# Patient Record
Sex: Male | Born: 1945
Health system: Southern US, Community
[De-identification: ages and names within clinical notes are randomized; demographics above are authoritative.]

## PROBLEM LIST (undated history)

## (undated) DIAGNOSIS — N5201 Erectile dysfunction due to arterial insufficiency: Secondary | ICD-10-CM

## (undated) DIAGNOSIS — K219 Gastro-esophageal reflux disease without esophagitis: Secondary | ICD-10-CM

## (undated) DIAGNOSIS — N401 Enlarged prostate with lower urinary tract symptoms: Secondary | ICD-10-CM

## (undated) DIAGNOSIS — I10 Essential (primary) hypertension: Secondary | ICD-10-CM

## (undated) DIAGNOSIS — N400 Enlarged prostate without lower urinary tract symptoms: Secondary | ICD-10-CM

## (undated) DIAGNOSIS — N138 Other obstructive and reflux uropathy: Secondary | ICD-10-CM

## (undated) DIAGNOSIS — R351 Nocturia: Secondary | ICD-10-CM

## (undated) DIAGNOSIS — R972 Elevated prostate specific antigen [PSA]: Secondary | ICD-10-CM

## (undated) DIAGNOSIS — E785 Hyperlipidemia, unspecified: Secondary | ICD-10-CM

## (undated) DIAGNOSIS — N419 Inflammatory disease of prostate, unspecified: Secondary | ICD-10-CM

## (undated) DIAGNOSIS — I4891 Unspecified atrial fibrillation: Secondary | ICD-10-CM

## (undated) DIAGNOSIS — R011 Cardiac murmur, unspecified: Secondary | ICD-10-CM

## (undated) DIAGNOSIS — M1990 Unspecified osteoarthritis, unspecified site: Secondary | ICD-10-CM

## (undated) HISTORY — DX: Hyperlipidemia, unspecified: E78.5

## (undated) HISTORY — DX: Inflammatory disease of prostate, unspecified: N41.9

## (undated) HISTORY — DX: Gastro-esophageal reflux disease without esophagitis: K21.9

## (undated) HISTORY — DX: Other obstructive and reflux uropathy: N40.1

## (undated) HISTORY — DX: Erectile dysfunction due to arterial insufficiency: N52.01

## (undated) HISTORY — DX: Cardiac murmur, unspecified: R01.1

## (undated) HISTORY — DX: Essential (primary) hypertension: I10

## (undated) HISTORY — DX: Benign prostatic hyperplasia without lower urinary tract symptoms: N40.0

## (undated) HISTORY — DX: Unspecified atrial fibrillation: I48.91

## (undated) HISTORY — DX: Nocturia: R35.1

## (undated) HISTORY — DX: Benign prostatic hyperplasia with lower urinary tract symptoms: N40.1

## (undated) HISTORY — PX: JOINT REPLACEMENT: SHX530

## (undated) HISTORY — DX: Elevated prostate specific antigen (PSA): R97.20

## (undated) HISTORY — DX: Other obstructive and reflux uropathy: N13.8

## (undated) HISTORY — DX: Unspecified osteoarthritis, unspecified site: M19.90

---

## 2002-11-14 ENCOUNTER — Ambulatory Visit (HOSPITAL_COMMUNITY): Admission: RE | Admit: 2002-11-14 | Discharge: 2002-11-14 | Payer: Self-pay | Admitting: *Deleted

## 2002-11-14 ENCOUNTER — Encounter: Payer: Self-pay | Admitting: Internal Medicine

## 2002-11-16 ENCOUNTER — Encounter: Payer: Self-pay | Admitting: Internal Medicine

## 2004-04-08 ENCOUNTER — Encounter: Admission: RE | Admit: 2004-04-08 | Discharge: 2004-04-08 | Payer: Self-pay | Admitting: Family Medicine

## 2006-08-21 ENCOUNTER — Ambulatory Visit: Payer: Self-pay | Admitting: Family Medicine

## 2006-08-21 LAB — CONVERTED CEMR LAB
ALT: 25 units/L (ref 0–40)
AST: 25 units/L (ref 0–37)
BUN: 20 mg/dL (ref 6–23)
CO2: 30 meq/L (ref 19–32)
Calcium: 9.7 mg/dL (ref 8.4–10.5)
Chloride: 102 meq/L (ref 96–112)
Creatinine, Ser: 1.1 mg/dL (ref 0.4–1.5)
VLDL: 7 mg/dL (ref 0–40)

## 2006-12-08 ENCOUNTER — Ambulatory Visit: Payer: Self-pay | Admitting: Family Medicine

## 2006-12-08 LAB — CONVERTED CEMR LAB
ALT: 23 units/L (ref 0–40)
Alkaline Phosphatase: 85 units/L (ref 39–117)
BUN: 17 mg/dL (ref 6–23)
Bilirubin, Direct: 0.1 mg/dL (ref 0.0–0.3)
CO2: 29 meq/L (ref 19–32)
Cholesterol: 145 mg/dL (ref 0–200)
Creatinine, Ser: 1 mg/dL (ref 0.4–1.5)
GFR calc Af Amer: 97 mL/min
Glucose, Bld: 113 mg/dL — ABNORMAL HIGH (ref 70–99)
PSA: 1.2 ng/mL (ref 0.10–4.00)
Potassium: 4.2 meq/L (ref 3.5–5.1)
Total Bilirubin: 0.9 mg/dL (ref 0.3–1.2)
Total CHOL/HDL Ratio: 2.9
Total Protein: 6.9 g/dL (ref 6.0–8.3)
Triglycerides: 52 mg/dL (ref 0–149)

## 2007-01-05 ENCOUNTER — Ambulatory Visit: Payer: Self-pay | Admitting: Family Medicine

## 2007-01-05 LAB — CONVERTED CEMR LAB: Total CK: 279 units/L (ref 7–195)

## 2007-03-08 DIAGNOSIS — E785 Hyperlipidemia, unspecified: Secondary | ICD-10-CM | POA: Insufficient documentation

## 2007-03-08 DIAGNOSIS — E1169 Type 2 diabetes mellitus with other specified complication: Secondary | ICD-10-CM | POA: Insufficient documentation

## 2007-03-08 DIAGNOSIS — I10 Essential (primary) hypertension: Secondary | ICD-10-CM | POA: Insufficient documentation

## 2007-03-08 DIAGNOSIS — N400 Enlarged prostate without lower urinary tract symptoms: Secondary | ICD-10-CM

## 2007-03-09 ENCOUNTER — Ambulatory Visit: Payer: Self-pay | Admitting: Family Medicine

## 2007-03-09 ENCOUNTER — Encounter (INDEPENDENT_AMBULATORY_CARE_PROVIDER_SITE_OTHER): Payer: Self-pay | Admitting: Family Medicine

## 2007-03-09 ENCOUNTER — Encounter: Payer: Self-pay | Admitting: Family Medicine

## 2007-03-09 LAB — CONVERTED CEMR LAB
BUN: 19 mg/dL
CO2: 29 meq/L
Calcium: 9.9 mg/dL
Chloride: 106 meq/L
Cholesterol: 203 mg/dL
Creatinine, Ser: 1 mg/dL
Direct LDL: 121.8 mg/dL
GFR calc Af Amer: 97 mL/min
GFR calc non Af Amer: 80 mL/min
Glucose, Bld: 110 mg/dL — ABNORMAL HIGH
HDL: 52.2 mg/dL
Potassium: 4.3 meq/L
Sodium: 141 meq/L
Total CHOL/HDL Ratio: 3.9
Total CK: 241 U/L
Triglycerides: 89 mg/dL
VLDL: 18 mg/dL

## 2007-03-10 ENCOUNTER — Telehealth (INDEPENDENT_AMBULATORY_CARE_PROVIDER_SITE_OTHER): Payer: Self-pay | Admitting: *Deleted

## 2007-03-15 ENCOUNTER — Telehealth (INDEPENDENT_AMBULATORY_CARE_PROVIDER_SITE_OTHER): Payer: Self-pay | Admitting: *Deleted

## 2007-05-17 ENCOUNTER — Ambulatory Visit: Payer: Self-pay | Admitting: Family Medicine

## 2007-05-25 LAB — CONVERTED CEMR LAB: Total CK: 215 units/L (ref 7–195)

## 2007-05-26 ENCOUNTER — Telehealth (INDEPENDENT_AMBULATORY_CARE_PROVIDER_SITE_OTHER): Payer: Self-pay | Admitting: *Deleted

## 2007-09-07 ENCOUNTER — Ambulatory Visit: Payer: Self-pay | Admitting: Family Medicine

## 2007-09-10 ENCOUNTER — Encounter (INDEPENDENT_AMBULATORY_CARE_PROVIDER_SITE_OTHER): Payer: Self-pay | Admitting: *Deleted

## 2007-09-10 ENCOUNTER — Telehealth (INDEPENDENT_AMBULATORY_CARE_PROVIDER_SITE_OTHER): Payer: Self-pay | Admitting: *Deleted

## 2007-09-28 ENCOUNTER — Ambulatory Visit: Payer: Self-pay | Admitting: Family Medicine

## 2007-09-28 DIAGNOSIS — R748 Abnormal levels of other serum enzymes: Secondary | ICD-10-CM | POA: Insufficient documentation

## 2007-09-29 ENCOUNTER — Encounter (INDEPENDENT_AMBULATORY_CARE_PROVIDER_SITE_OTHER): Payer: Self-pay | Admitting: Family Medicine

## 2007-09-29 LAB — CONVERTED CEMR LAB
BUN: 21 mg/dL (ref 6–23)
CK-MB: 3.3 ng/mL (ref 0.3–4.0)
CO2: 28 meq/L (ref 19–32)
Calcium: 9.9 mg/dL (ref 8.4–10.5)
Chloride: 103 meq/L (ref 96–112)
Creatinine, Ser: 1.1 mg/dL (ref 0.4–1.5)
Direct LDL: 151.6 mg/dL
Potassium: 4.1 meq/L (ref 3.5–5.1)
VLDL: 15 mg/dL (ref 0–40)

## 2007-09-30 LAB — CONVERTED CEMR LAB
CO2: 28 meq/L (ref 19–32)
Calcium: 9.9 mg/dL (ref 8.4–10.5)
Cholesterol: 211 mg/dL (ref 0–200)
Direct LDL: 151.6 mg/dL
GFR calc Af Amer: 87 mL/min
Glucose, Bld: 94 mg/dL (ref 70–99)
HDL: 45.2 mg/dL (ref >39.0)
Sodium: 140 meq/L (ref 135–145)
Triglycerides: 74 mg/dL (ref 0–149)

## 2007-10-04 ENCOUNTER — Telehealth (INDEPENDENT_AMBULATORY_CARE_PROVIDER_SITE_OTHER): Payer: Self-pay | Admitting: Family Medicine

## 2008-01-06 ENCOUNTER — Ambulatory Visit: Payer: Self-pay | Admitting: Family Medicine

## 2008-01-06 LAB — CONVERTED CEMR LAB
ALT: 23 units/L (ref 0–53)
Calcium: 9.5 mg/dL (ref 8.4–10.5)
Direct LDL: 149.9 mg/dL
GFR calc Af Amer: 97 mL/min
GFR calc non Af Amer: 80 mL/min
Glucose, Bld: 107 mg/dL — ABNORMAL HIGH (ref 70–99)
Sodium: 141 meq/L (ref 135–145)
Total CHOL/HDL Ratio: 5.1
Triglycerides: 85 mg/dL (ref 0–149)
VLDL: 17 mg/dL (ref 0–40)

## 2008-01-07 ENCOUNTER — Encounter (INDEPENDENT_AMBULATORY_CARE_PROVIDER_SITE_OTHER): Payer: Self-pay | Admitting: *Deleted

## 2008-02-15 ENCOUNTER — Encounter: Payer: Self-pay | Admitting: Internal Medicine

## 2008-05-03 ENCOUNTER — Ambulatory Visit: Payer: Self-pay | Admitting: Internal Medicine

## 2008-05-08 ENCOUNTER — Encounter: Payer: Self-pay | Admitting: Internal Medicine

## 2008-05-08 LAB — CONVERTED CEMR LAB
Total CHOL/HDL Ratio: 5.8
Total CK: 153 units/L (ref 7–195)
VLDL: 22 mg/dL (ref 0–40)

## 2008-05-23 ENCOUNTER — Telehealth (INDEPENDENT_AMBULATORY_CARE_PROVIDER_SITE_OTHER): Payer: Self-pay | Admitting: *Deleted

## 2008-09-20 ENCOUNTER — Ambulatory Visit: Payer: Self-pay | Admitting: Internal Medicine

## 2008-09-20 DIAGNOSIS — K219 Gastro-esophageal reflux disease without esophagitis: Secondary | ICD-10-CM | POA: Insufficient documentation

## 2008-09-22 LAB — CONVERTED CEMR LAB
ALT: 18 units/L (ref 0–53)
AST: 20 units/L (ref 0–37)
Cholesterol: 149 mg/dL (ref 0–200)

## 2008-09-25 ENCOUNTER — Telehealth (INDEPENDENT_AMBULATORY_CARE_PROVIDER_SITE_OTHER): Payer: Self-pay | Admitting: *Deleted

## 2008-09-25 ENCOUNTER — Encounter: Payer: Self-pay | Admitting: Internal Medicine

## 2009-01-23 ENCOUNTER — Ambulatory Visit: Payer: Self-pay | Admitting: Internal Medicine

## 2009-01-23 DIAGNOSIS — K4090 Unilateral inguinal hernia, without obstruction or gangrene, not specified as recurrent: Secondary | ICD-10-CM | POA: Insufficient documentation

## 2009-01-29 ENCOUNTER — Telehealth (INDEPENDENT_AMBULATORY_CARE_PROVIDER_SITE_OTHER): Payer: Self-pay | Admitting: *Deleted

## 2009-01-29 LAB — CONVERTED CEMR LAB
ALT: 21 units/L (ref 0–53)
Basophils Absolute: 0 10*3/uL (ref 0.0–0.1)
Basophils Relative: 0.3 % (ref 0.0–3.0)
Calcium: 9.1 mg/dL (ref 8.4–10.5)
GFR calc non Af Amer: 79.68 mL/min (ref >60)
HDL: 37.7 mg/dL — ABNORMAL LOW (ref >39.00)
Hemoglobin: 14.3 g/dL (ref 13.0–17.0)
Lymphocytes Relative: 25.9 % (ref 12.0–46.0)
Monocytes Relative: 14.7 % — ABNORMAL HIGH (ref 3.0–12.0)
Neutro Abs: 3 10*3/uL (ref 1.4–7.7)
RBC: 4.54 M/uL (ref 4.22–5.81)
RDW: 13.3 % (ref 11.5–14.6)
Sodium: 142 meq/L (ref 135–145)
TSH: 1.64 microintl units/mL (ref 0.35–5.50)
VLDL: 20.4 mg/dL (ref 0.0–40.0)
WBC: 5.2 10*3/uL (ref 4.5–10.5)

## 2009-07-10 ENCOUNTER — Ambulatory Visit: Payer: Self-pay | Admitting: Internal Medicine

## 2009-07-13 ENCOUNTER — Telehealth (INDEPENDENT_AMBULATORY_CARE_PROVIDER_SITE_OTHER): Payer: Self-pay | Admitting: *Deleted

## 2009-07-13 LAB — CONVERTED CEMR LAB
Basophils Absolute: 0 10*3/uL (ref 0.0–0.1)
Eosinophils Absolute: 0.1 10*3/uL (ref 0.0–0.7)
Hemoglobin: 15.3 g/dL (ref 13.0–17.0)
Lymphocytes Relative: 26.6 % (ref 12.0–46.0)
Lymphs Abs: 1.1 10*3/uL (ref 0.7–4.0)
MCHC: 34 g/dL (ref 30.0–36.0)
Monocytes Absolute: 0.5 10*3/uL (ref 0.1–1.0)
Neutro Abs: 2.4 10*3/uL (ref 1.4–7.7)
RDW: 13.5 % (ref 11.5–14.6)

## 2010-01-09 ENCOUNTER — Ambulatory Visit: Payer: Self-pay | Admitting: Internal Medicine

## 2010-01-09 DIAGNOSIS — D696 Thrombocytopenia, unspecified: Secondary | ICD-10-CM

## 2010-01-21 LAB — CONVERTED CEMR LAB
CO2: 29 meq/L (ref 19–32)
Chloride: 105 meq/L (ref 96–112)
Glucose, Bld: 99 mg/dL (ref 70–99)
HDL: 52.4 mg/dL (ref >39.00)
Potassium: 4.1 meq/L (ref 3.5–5.1)
Sodium: 141 meq/L (ref 135–145)

## 2010-06-03 ENCOUNTER — Encounter: Payer: Self-pay | Admitting: Internal Medicine

## 2010-06-03 LAB — HM COLONOSCOPY

## 2010-07-11 ENCOUNTER — Ambulatory Visit: Payer: Self-pay | Admitting: Internal Medicine

## 2010-07-15 ENCOUNTER — Encounter (INDEPENDENT_AMBULATORY_CARE_PROVIDER_SITE_OTHER): Payer: Self-pay | Admitting: *Deleted

## 2010-07-15 LAB — CONVERTED CEMR LAB
AST: 21 units/L (ref 0–37)
Basophils Relative: 0.6 % (ref 0.0–3.0)
CO2: 29 meq/L (ref 19–32)
Chloride: 106 meq/L (ref 96–112)
Eosinophils Relative: 1.9 % (ref 0.0–5.0)
Glucose, Bld: 92 mg/dL (ref 70–99)
HCT: 42.7 % (ref 39.0–52.0)
Lymphs Abs: 1 10*3/uL (ref 0.7–4.0)
MCV: 91.1 fL (ref 78.0–100.0)
Monocytes Absolute: 0.5 10*3/uL (ref 0.1–1.0)
Monocytes Relative: 11.2 % (ref 3.0–12.0)
Potassium: 4.5 meq/L (ref 3.5–5.1)
RBC: 4.69 M/uL (ref 4.22–5.81)
Sodium: 142 meq/L (ref 135–145)
WBC: 4.1 10*3/uL — ABNORMAL LOW (ref 4.5–10.5)

## 2010-07-22 ENCOUNTER — Ambulatory Visit: Payer: Self-pay | Admitting: Internal Medicine

## 2010-07-27 HISTORY — PX: HERNIA REPAIR: SHX51

## 2010-08-02 ENCOUNTER — Encounter: Payer: Self-pay | Admitting: Internal Medicine

## 2010-08-12 ENCOUNTER — Ambulatory Visit (HOSPITAL_COMMUNITY): Admission: RE | Admit: 2010-08-12 | Discharge: 2010-08-12 | Payer: Self-pay | Admitting: Surgery

## 2010-09-05 ENCOUNTER — Encounter: Payer: Self-pay | Admitting: Internal Medicine

## 2010-10-22 ENCOUNTER — Encounter: Payer: Self-pay | Admitting: Internal Medicine

## 2010-11-26 NOTE — Procedures (Signed)
Summary: Colonoscopy--polyps, internal hemorrhoids  Colonoscopy/[facility]   Imported By: Lanelle Bal 06/20/2010 11:13:21  _____________________________________________________________________  External Attachment:    Type:   Image     Comment:   External Document

## 2010-11-26 NOTE — Assessment & Plan Note (Signed)
Summary: fu on meds/kdc   Vital Signs:  Patient profile:   65 year old male Height:      73.25 inches Weight:      214.8 pounds BMI:     28.25 Pulse rate:   76 / minute BP sitting:   118 / 80  Vitals Entered By: Shary Decamp (January 09, 2010 11:36 AM) CC: rov   History of Present Illness: routine visit , fasting, needs refills  Current Medications (verified): 1)  Amlodipine Besylate 10 Mg  Tabs (Amlodipine Besylate) .... Take One Tablet Daily 2)  Doxazosin Mesylate 8 Mg  Tabs (Doxazosin Mesylate) .... Take One Tablet Daily 3)  Calcium 500/d 500-125 Mg-Unit  Tabs (Calcium Carbonate-Vitamin D) 4)  Glucosamine-Chondroitin 1500-1200 Mg/33ml  Liqd (Glucosamine-Chondroitin) 5)  Pravachol 40 Mg  Tabs (Pravastatin Sodium) .Marland Kitchen.. 1 By Mouth Qhs 6)  Fish Oil 1000 Mg Caps (Omega-3 Fatty Acids) .... Take 1 Cap Once Daily 7)  Vitamin D3 2000 Unit Caps (Cholecalciferol) .... Take 1 Cap Once Daily 8)  Mvi 9)  Fish Oil  Allergies (verified): No Known Drug Allergies  Past History:  Past Medical History: Reviewed history from 01/23/2009 and no changes required. Hyperlipidemia Hypertension Benign prostatic hypertrophy, ED --sees urology q year GERD  Past Surgical History: Reviewed history from 05/03/2008 and no changes required. Denies surgical history  Social History: Reviewed history from 01/23/2009 and no changes required. Married  retired 2 children has 3 acres, works in the yard a lot x 6 hours sometimes, feels well  Review of Systems        ambulatory BPs within normal limits, usually around 130/70 good medication compliance with his cholesterol and BP medications denies any myalgias except for occasionally bicipital  pain when he does  heavy lifting he continued to have a healthy lifestyle  Physical Exam  General:  alert, well-developed, and well-nourished.   Lungs:  normal respiratory effort, no intercostal retractions, no accessory muscle use, and normal breath  sounds.   Heart:  normal rate, regular rhythm, no murmur, and no gallop.   Extremities:  no lower extremity edema   Impression & Recommendations:  Problem # 1:  HYPERTENSION (ICD-401.9) at goal, no change His updated medication list for this problem includes:    Amlodipine Besylate 10 Mg Tabs (Amlodipine besylate) .Marland Kitchen... Take one tablet daily    Doxazosin Mesylate 8 Mg Tabs (Doxazosin mesylate) .Marland Kitchen... Take one tablet daily  Orders: TLB-BMP (Basic Metabolic Panel-BMET) (80048-METABOL)  BP today: 118/80 Prior BP: 120/80 (07/10/2009)  Labs Reviewed: K+: 4.0 (01/23/2009) Creat: : 1.0 (01/23/2009)   Chol: 142 (01/23/2009)   HDL: 37.70 (01/23/2009)   LDL: 84 (01/23/2009)   TG: 102.0 (01/23/2009)  Problem # 2:  HYPERLIPIDEMIA (ICD-272.4) due for labs  His updated medication list for this problem includes:    Pravachol 40 Mg Tabs (Pravastatin sodium) .Marland Kitchen... 1 by mouth qhs  Orders: Venipuncture (78295) TLB-Lipid Panel (80061-LIPID) TLB-ALT (SGPT) (84460-ALT) TLB-AST (SGOT) (84450-SGOT)  Problem # 3:  THROMBOCYTOPENIA (ICD-287.5) mild, stable thrombocytopenia, patient asymptomatic. Observation  Complete Medication List: 1)  Amlodipine Besylate 10 Mg Tabs (Amlodipine besylate) .... Take one tablet daily 2)  Doxazosin Mesylate 8 Mg Tabs (Doxazosin mesylate) .... Take one tablet daily 3)  Calcium 500/d 500-125 Mg-unit Tabs (Calcium carbonate-vitamin d) 4)  Glucosamine-chondroitin 1500-1200 Mg/71ml Liqd (Glucosamine-chondroitin) 5)  Pravachol 40 Mg Tabs (Pravastatin sodium) .Marland Kitchen.. 1 by mouth qhs 6)  Fish Oil 1000 Mg Caps (Omega-3 fatty acids) .... Take 1 cap once daily 7)  Vitamin  D3 2000 Unit Caps (Cholecalciferol) .... Take 1 cap once daily 8)  Mvi  9)  Fish Oil   Patient Instructions: 1)  Please schedule a follow-up appointment in 6  months ( yearly checkup) Prescriptions: PRAVACHOL 40 MG  TABS (PRAVASTATIN SODIUM) 1 by mouth qhs  #30 Tablet x 12   Entered and Authorized by:    Beckham Buxbaum E. Eleesha Purkey MD   Signed by:   Nolon Rod. Kerstie Agent MD on 01/09/2010   Method used:   Print then Give to Patient   RxID:   [id number] DOXAZOSIN MESYLATE 8 MG  TABS (DOXAZOSIN MESYLATE) Take one tablet daily  #30 Tablet x 12   Entered and Authorized by:   Nolon Rod. Eve Rey MD   Signed by:   Nolon Rod. Mattheo Swindle MD on 01/09/2010   Method used:   Print then Give to Patient   RxID:   [id number] AMLODIPINE BESYLATE 10 MG  TABS (AMLODIPINE BESYLATE) TAKE ONE TABLET DAILY  #30 x 12   Entered and Authorized by:   Nolon Rod. Colie Fugitt MD   Signed by:   Nolon Rod. Kenlei Safi MD on 01/09/2010   Method used:   Print then Give to Patient   RxID:   [id number]

## 2010-11-26 NOTE — Letter (Signed)
Summary: hernia surgery f/u, f/u prn---- Surgery  [facility]   Imported By: Lanelle Bal 09/24/2010 09:20:48  _____________________________________________________________________  External Attachment:    Type:   Image     Comment:   External Document

## 2010-11-26 NOTE — Assessment & Plan Note (Signed)
Summary: SHINGLE SHOT//PH   Nurse Visit   Allergies: No Known Drug Allergies  Immunizations Administered:  Zostavax # 1:    Vaccine Type: Zostavax    Site: right deltoid    Mfr: Merck    Dose: 0.5 ml    Route: [place]    Given by: Danielle Barmer CMA    Exp. Date: 06/14/2011    Lot #: [id number]  Orders Added: 1)  Zoster (Shingles) Vaccine Live [90736] 2)  Admin 1st Vaccine [90471] 

## 2010-11-26 NOTE — Assessment & Plan Note (Signed)
Summary: FOR MED REFILL//PH   Vital Signs:  Patient profile:   65 year old male Weight:      213.50 pounds Pulse rate:   76 / minute Pulse rhythm:   regular BP sitting:   132 / 84  (left arm) Cuff size:   large  Vitals Entered By: Army Fossa CMA (July 11, 2010 10:44 AM) CC: pt here for f/u fasting Comments no refills needed declines flu shot    History of Present Illness: history of a right inguinal hernia,thinks is getting worse, slightly larger, painful when he does  heavy lifting  Reports severe episode off dysuria, frequency, was initially eval and to urgent care and then he saw his regular urologist, and diagnosed with prostatitis, PSA has been checked several times. He is now asymptomatic  Complain also left shoulder pain on and off for a while, pain is mostly nocturnal  Hyperlipidemia -- good medication compliance  Hypertension-- ambulatory BPs  120/80s     Current Medications (verified): 1)  Amlodipine Besylate 10 Mg  Tabs (Amlodipine Besylate) .... Take One Tablet Daily 2)  Doxazosin Mesylate 8 Mg  Tabs (Doxazosin Mesylate) .... Take One Tablet Daily 3)  Calcium 500/d 500-125 Mg-Unit  Tabs (Calcium Carbonate-Vitamin D) 4)  Glucosamine-Chondroitin 1500-1200 Mg/7ml  Liqd (Glucosamine-Chondroitin) 5)  Pravachol 40 Mg  Tabs (Pravastatin Sodium) .Marland Kitchen.. 1 By Mouth Qhs 6)  Fish Oil 1000 Mg Caps (Omega-3 Fatty Acids) .... Take 1 Cap Once Daily 7)  Vitamin D3 2000 Unit Caps (Cholecalciferol) .... Take 1 Cap Once Daily 8)  Mvi 9)  Fish Oil  Allergies (verified): No Known Drug Allergies  Past History:  Past Medical History: Hyperlipidemia Hypertension Benign prostatic hypertrophy, ED --sees urology q year GERD  Past Surgical History: Reviewed history from 05/03/2008 and no changes required. Denies surgical history  Social History: Reviewed history from 01/23/2009 and no changes required. Married  retired 2 children has 3 acres, works in the yard a  lot x 6 hours sometimes, feels well  Review of Systems CV:  Denies chest pain or discomfort and swelling of feet. Resp:  Denies cough and shortness of breath.  Physical Exam  General:  alert and well-developed.   Lungs:  normal respiratory effort, no intercostal retractions, no accessory muscle use, and normal breath sounds.   Heart:  normal rate, regular rhythm, no murmur, and no gallop.   Abdomen:    R inguinal hernia   w/ valsalva,  definitely larger than before, about 1 inch in size. Genitalia:  circumcised, no hydrocele, no varicocele, and no scrotal masses.   Msk:  right shoulder normal Left shoulder with normal range of motion, no deformities, slightly painful with elevation. Extremities:  no edema   Impression & Recommendations:  Problem # 1:  THROMBOCYTOPENIA (ICD-287.5) recheck a CBC  Orders: TLB-CBC Platelet - w/Differential (85025-CBCD) Specimen Handling ([address])  Orders: TLB-CBC Platelet - w/Differential (85025-CBCD) Specimen Handling (60454)  Problem # 2:  INGUINAL HERNIA, RIGHT (ICD-550.90)  definitely getting larger and now symptomatic. Refer to surgery  Orders: Surgical Referral (Surgery)  Problem # 3:  BENIGN PROSTATIC HYPERTROPHY (ICD-600.00) recent episode of prostatitis, status post evaluation by urology. Getting better  Problem # 4:  HYPERTENSION (ICD-401.9) at goal  His updated medication list for this problem includes:    Amlodipine Besylate 10 Mg Tabs (Amlodipine besylate) .Marland Kitchen... Take one tablet daily    Doxazosin Mesylate 8 Mg Tabs (Doxazosin mesylate) .Marland Kitchen... Take one tablet daily    BP today: 132/84 Prior BP:  118/80 (01/09/2010)  Labs Reviewed: K+: 4.1 (01/09/2010) Creat: : 1.0 (01/09/2010)   Chol: 152 (01/09/2010)   HDL: 52.40 (01/09/2010)   LDL: 82 (01/09/2010)   TG: 90.0 (01/09/2010)  Orders: Venipuncture ([address]) TLB-BMP (Basic Metabolic Panel-BMET) (80048-METABOL) Specimen Handling (60454)  Problem # 5:  HYPERLIPIDEMIA  (ICD-272.4) labs His updated medication list for this problem includes:    Pravachol 40 Mg Tabs (Pravastatin sodium) .Marland Kitchen... 1 by mouth qhs    Labs Reviewed: SGOT: 22 (01/09/2010)   SGPT: 24 (01/09/2010)   HDL:52.40 (01/09/2010), 37.70 (01/23/2009)  LDL:82 (01/09/2010), 84 (01/23/2009)  Chol:152 (01/09/2010), 142 (01/23/2009)  Trig:90.0 (01/09/2010), 102.0 (01/23/2009)  Orders: TLB-ALT (SGPT) (84460-ALT) TLB-AST (SGOT) (84450-SGOT) Specimen Handling (09811)  Problem # 6:  declines flu shot, explained the benefits  Problem # 7:  shoulder pain recommend physical therapy and Advil as needed  Complete Medication List: 1)  Amlodipine Besylate 10 Mg Tabs (Amlodipine besylate) .... Take one tablet daily 2)  Doxazosin Mesylate 8 Mg Tabs (Doxazosin mesylate) .... Take one tablet daily 3)  Calcium 500/d 500-125 Mg-unit Tabs (Calcium carbonate-vitamin d) 4)  Glucosamine-chondroitin 1500-1200 Mg/65ml Liqd (Glucosamine-chondroitin) 5)  Pravachol 40 Mg Tabs (Pravastatin sodium) .Marland Kitchen.. 1 by mouth qhs 6)  Fish Oil 1000 Mg Caps (Omega-3 fatty acids) .... Take 1 cap once daily 7)  Vitamin D3 2000 Unit Caps (Cholecalciferol) .... Take 1 cap once daily 8)  Mvi  9)  Fish Oil   Patient Instructions: 1)  Please schedule a follow-up appointment in 4 months, fasting, physical exam 2)  Take 400  of Ibuprofen (Advil, Motrin) with food every 6 hours as needed  for relief of pain, watch for stomach irritation.

## 2010-11-26 NOTE — Consult Note (Signed)
Summary: planning  surgery  [facility]   Imported By: Lanelle Bal 08/21/2010 09:47:29  _____________________________________________________________________  External Attachment:    Type:   Image     Comment:   External Document

## 2010-11-26 NOTE — Letter (Signed)
Summary: Primary Care Consult Scheduled Letter  [facility] [place]  [address] [place], [place] [address]   Phone: [phone]  Fax: [phone]      07/15/2010 MRN: [id number]  NYCERE PRESLEY [address] [place], [place]  [address]    Dear Mr. MONDAY,    We have scheduled an appointment for you.  At the recommendation of Dr. Willow Ora, we have scheduled you a consult with Dr. Ovidio Kin of [facility] on 08-02-2010 arrive by 2:45pm.  Their address is [address], [place] [address]. The office phone number is [phone].  If this appointment day and time is not convenient for you, please feel free to call the office of the doctor you are being referred to at the number listed above and reschedule the appointment.    It is important for you to keep your scheduled appointments. We are here to make sure you are given good patient care.   Thank you,    Renee, Patient Care Coordinator [facility]

## 2010-11-28 NOTE — Letter (Signed)
Summary: BPH, ED; doing ok, no changes--- [facility]  [facility]   Imported By: Lanelle Bal 11/01/2010 10:17:49  _____________________________________________________________________  External Attachment:    Type:   Image     Comment:   External Document

## 2011-01-07 ENCOUNTER — Encounter: Payer: Self-pay | Admitting: Internal Medicine

## 2011-01-09 LAB — CBC
HCT: 42.1 % (ref 39.0–52.0)
Hemoglobin: 14.4 g/dL (ref 13.0–17.0)
MCV: 90.1 fL (ref 78.0–100.0)
RDW: 14.6 % (ref 11.5–15.5)
WBC: 3.8 10*3/uL — ABNORMAL LOW (ref 4.0–10.5)

## 2011-01-09 LAB — SURGICAL PCR SCREEN: MRSA, PCR: NEGATIVE

## 2011-01-21 ENCOUNTER — Other Ambulatory Visit: Payer: Self-pay | Admitting: Internal Medicine

## 2011-01-28 ENCOUNTER — Ambulatory Visit (INDEPENDENT_AMBULATORY_CARE_PROVIDER_SITE_OTHER): Payer: Medicare Other | Admitting: Internal Medicine

## 2011-01-28 ENCOUNTER — Encounter: Payer: Self-pay | Admitting: Internal Medicine

## 2011-01-28 VITALS — BP 122/80 | HR 78 | Ht 72.5 in | Wt 210.8 lb

## 2011-01-28 DIAGNOSIS — E785 Hyperlipidemia, unspecified: Secondary | ICD-10-CM

## 2011-01-28 DIAGNOSIS — D696 Thrombocytopenia, unspecified: Secondary | ICD-10-CM

## 2011-01-28 DIAGNOSIS — I10 Essential (primary) hypertension: Secondary | ICD-10-CM

## 2011-01-28 DIAGNOSIS — Z0000 Encounter for general adult medical examination without abnormal findings: Secondary | ICD-10-CM | POA: Insufficient documentation

## 2011-01-28 DIAGNOSIS — R748 Abnormal levels of other serum enzymes: Secondary | ICD-10-CM

## 2011-01-28 DIAGNOSIS — N400 Enlarged prostate without lower urinary tract symptoms: Secondary | ICD-10-CM

## 2011-01-28 LAB — CBC WITH DIFFERENTIAL/PLATELET
Basophils Absolute: 0 10*3/uL (ref 0.0–0.1)
Basophils Relative: 0.3 % (ref 0.0–3.0)
Eosinophils Absolute: 0.1 10*3/uL (ref 0.0–0.7)
Lymphocytes Relative: 25.8 % (ref 12.0–46.0)
MCHC: 34.5 g/dL (ref 30.0–36.0)
MCV: 91.2 fl (ref 78.0–100.0)
Monocytes Absolute: 0.5 10*3/uL (ref 0.1–1.0)
Neutrophils Relative %: 61.7 % (ref 43.0–77.0)
Platelets: 132 10*3/uL — ABNORMAL LOW (ref 150.0–400.0)
RBC: 4.75 Mil/uL (ref 4.22–5.81)
RDW: 14.2 % (ref 11.5–14.6)

## 2011-01-28 MED ORDER — TETANUS-DIPHTH-ACELL PERTUSSIS 5-2.5-18.5 LF-MCG/0.5 IM SUSP
0.5000 mL | Freq: Once | INTRAMUSCULAR | Status: AC
  Administered 2011-01-28: 0.5 mL via INTRAMUSCULAR

## 2011-01-28 NOTE — Assessment & Plan Note (Signed)
Well controlled 

## 2011-01-28 NOTE — Assessment & Plan Note (Signed)
Due for labs

## 2011-01-28 NOTE — Assessment & Plan Note (Addendum)
CK was around 259 (2008) , slightly elevated. Labs Currently asx

## 2011-01-28 NOTE — Progress Notes (Signed)
  Subjective:    Patient ID: Thomas Bradley, male    DOB: Sep 18, 1946, 65 y.o.   MRN: [id number]  HPI Physical exam, chart reviewed Status post hernia repair, had his postop followup 08/2010, doing well, follow up when necessary BPH and ED, was seen by urology 09/2010, doing okay, no changes Hypertension--  Well controlled , amb BP ~119/75 Hyperlipidemia--   good medication compliance w/ meds, exercise regulalrly   Past Medical History  Diagnosis Date  . Hyperlipemia   . Hypertension   . Benign prostatic hypertrophy     ED, sees urologly every year  . GERD (gastroesophageal reflux disease)    Past Surgical History  Procedure Date  . Hernia repair 07-2010    open, R inguinal w/ mesh   Family History  Problem Relation Age of Onset  . Coronary artery disease Father   . Diabetes      brothers  . Hypertension Sister   . Stroke Neg Hx   . Colon cancer Neg Hx   . Prostate cancer Neg Hx    History   Social History  . Marital Status: Married    Spouse Name: N/A    Number of Children: N/A  . Years of Education: N/A   Occupational History  . Not on file.   Social History Main Topics  . Smoking status: Never Smoker   . Smokeless tobacco: Not on file  . Alcohol Use: No  . Drug Use: No  . Sexually Active: Not on file   Other Topics Concern  . Not on file   Social History Narrative   Has 3 acres, works in the yard a lot x 6 hours sometimes, feels well.    Exercise- yes joined gym      Review of Systems  Constitutional: Negative for fever, fatigue and unexpected weight change.  Respiratory: Negative for cough and shortness of breath.   Cardiovascular: Negative for chest pain, palpitations and leg swelling.  Gastrointestinal: Negative for abdominal pain and blood in stool.  Genitourinary: Negative for hematuria and decreased urine volume.       BPH sx at baseline        Objective:   Physical Exam  Constitutional: He is oriented to person, place, and time. He  appears well-developed and well-nourished.  HENT:  Head: Normocephalic and atraumatic.  Eyes: Conjunctivae and EOM are normal. No scleral icterus.  Neck: No thyromegaly present.       Normal carotid pulse   Cardiovascular: Normal rate and regular rhythm.  Exam reveals no gallop.   No murmur heard. Pulmonary/Chest: Effort normal and breath sounds normal. No respiratory distress. He has no wheezes. He has no rales.  Abdominal: Soft. Bowel sounds are normal. He exhibits no distension. There is no tenderness. There is no rebound.  Musculoskeletal: He exhibits no edema.  Lymphadenopathy:    He has no cervical adenopathy.  Neurological: He is alert and oriented to person, place, and time.  Psychiatric: He has a normal mood and affect. His behavior is normal. Judgment and thought content normal.          Assessment & Plan:

## 2011-01-28 NOTE — Assessment & Plan Note (Signed)
Per u rology 

## 2011-01-28 NOTE — Assessment & Plan Note (Signed)
Mild , platelets ~ 130-140 Labs

## 2011-01-28 NOTE — Assessment & Plan Note (Addendum)
Td 2006 and 01-2011 pneumonia shot 2010 Had a Shingles shot already  colonoscopy 2004,Dr Santogade, colonoscopy again 05-2010, hyperplastic polyps, repeat in 2016  sees urology q year  cont healthy life style

## 2011-01-28 NOTE — Patient Instructions (Signed)
Call when RF needed

## 2011-01-29 ENCOUNTER — Telehealth: Payer: Self-pay | Admitting: *Deleted

## 2011-01-29 LAB — LIPID PANEL
Cholesterol: 146 mg/dL (ref 0–200)
HDL: 41.7 mg/dL (ref >39.00)
LDL Cholesterol: 93 mg/dL (ref 0–99)
VLDL: 11 mg/dL (ref 0.0–40.0)

## 2011-01-29 LAB — BASIC METABOLIC PANEL
BUN: 23 mg/dL (ref 6–23)
Chloride: 106 mEq/L (ref 96–112)
GFR: 73.24 mL/min (ref >60.00)
Potassium: 4.1 mEq/L (ref 3.5–5.1)
Sodium: 141 mEq/L (ref 135–145)

## 2011-01-29 LAB — CK: Total CK: 117 U/L (ref 7–232)

## 2011-01-29 NOTE — Telephone Encounter (Signed)
Message left for patient to return my call.  

## 2011-01-29 NOTE — Telephone Encounter (Signed)
Message copied by Army Fossa on Wed Jan 29, 2011  4:16 PM ------      Message from: Willow Ora      Created: Wed Jan 29, 2011  1:09 PM       Advise patient:      Cholesterol is under excellent control      Blood count stable, platelets are slightly low as usual.      Muscle test, CKs, are now completely normal      Very good results

## 2011-01-30 NOTE — Telephone Encounter (Signed)
Spoke w/ pt aware of labs.  

## 2011-02-02 IMAGING — CR DG CHEST 2V
2 series · 2 of 2 positions shown · non-contrast
Comparison: None

CLINICAL DATA: Right inguinal and umbilical hernias, preoperative
assessment, history hypertension, smoking, cardiomegaly

CHEST - 2 VIEW

[w chest pa]
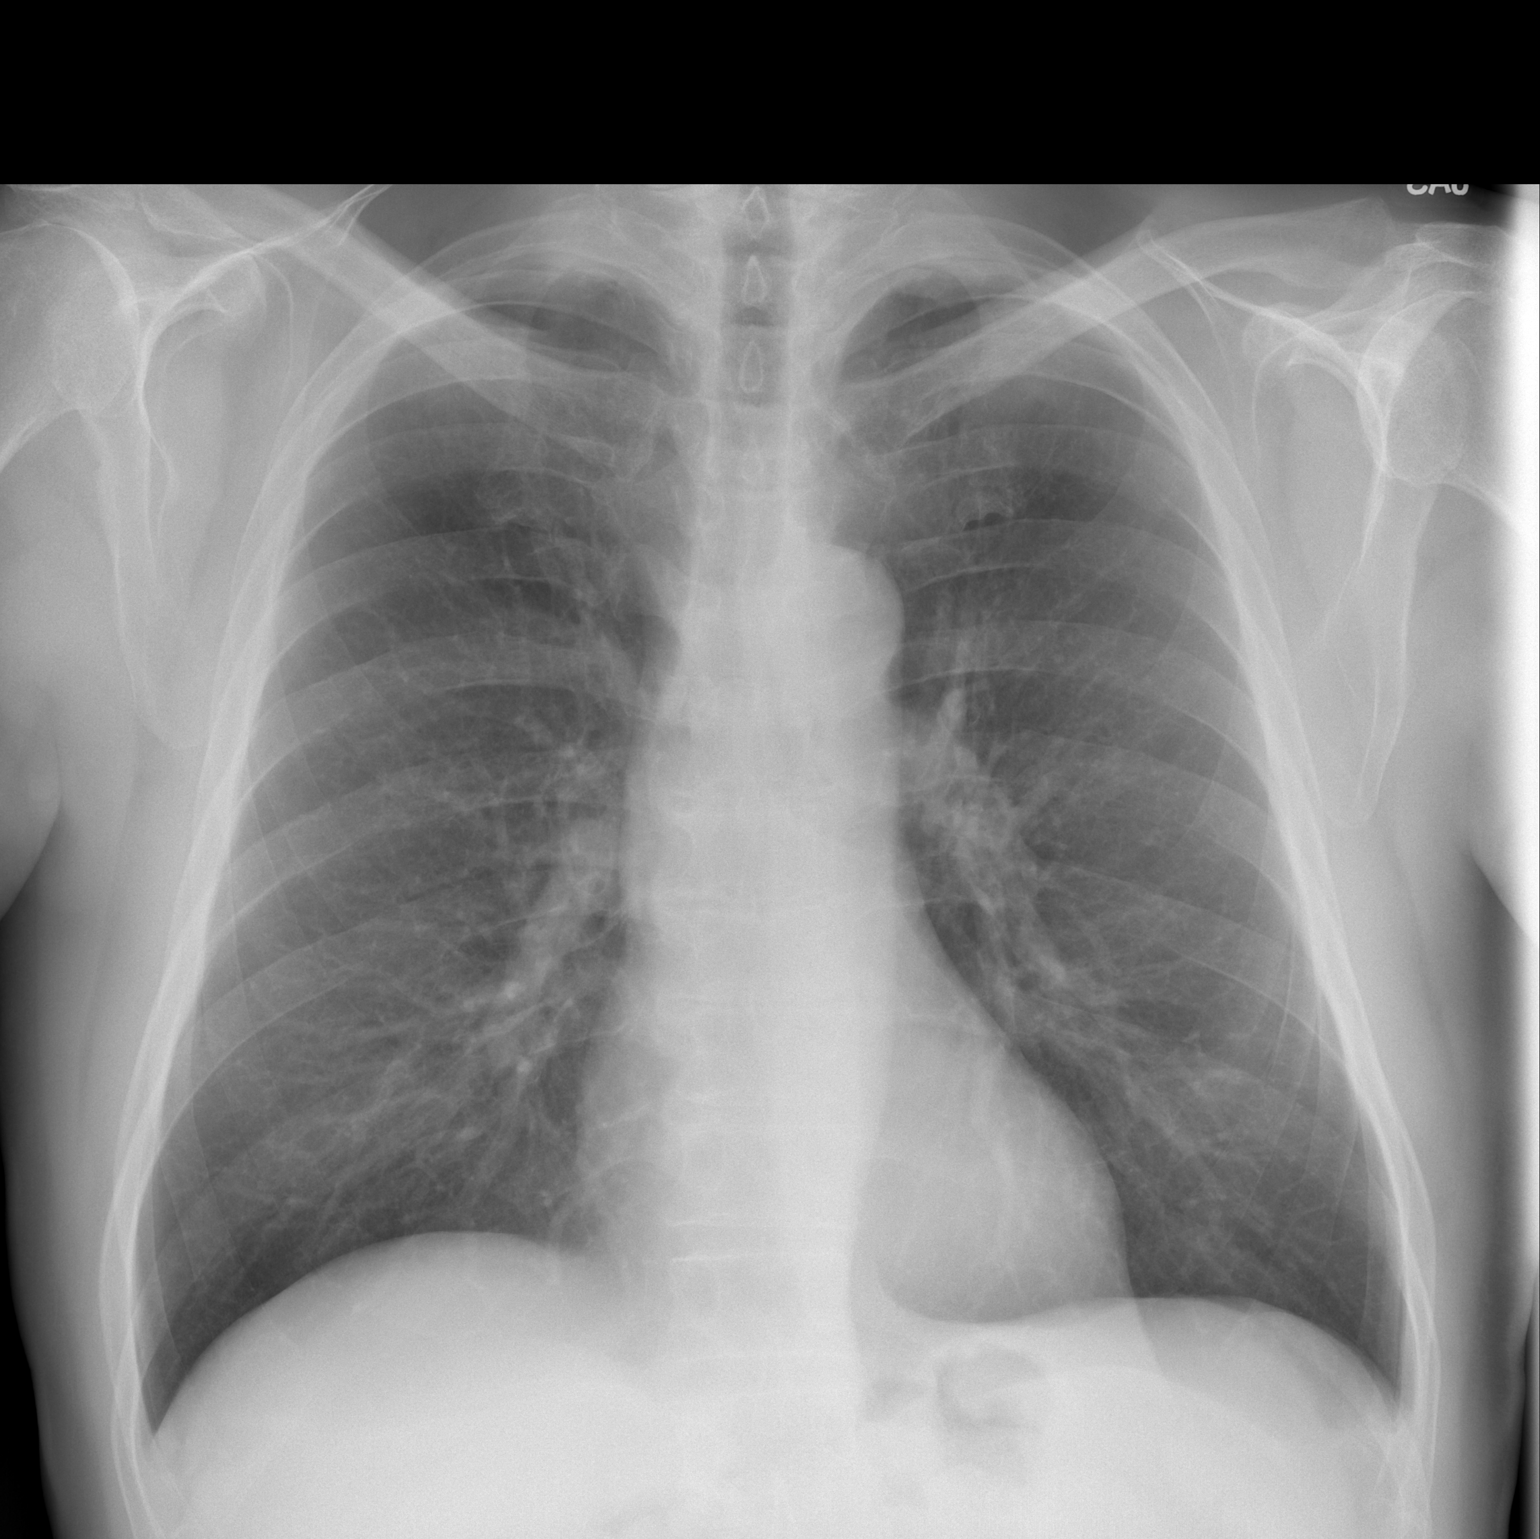

[w chest lat]
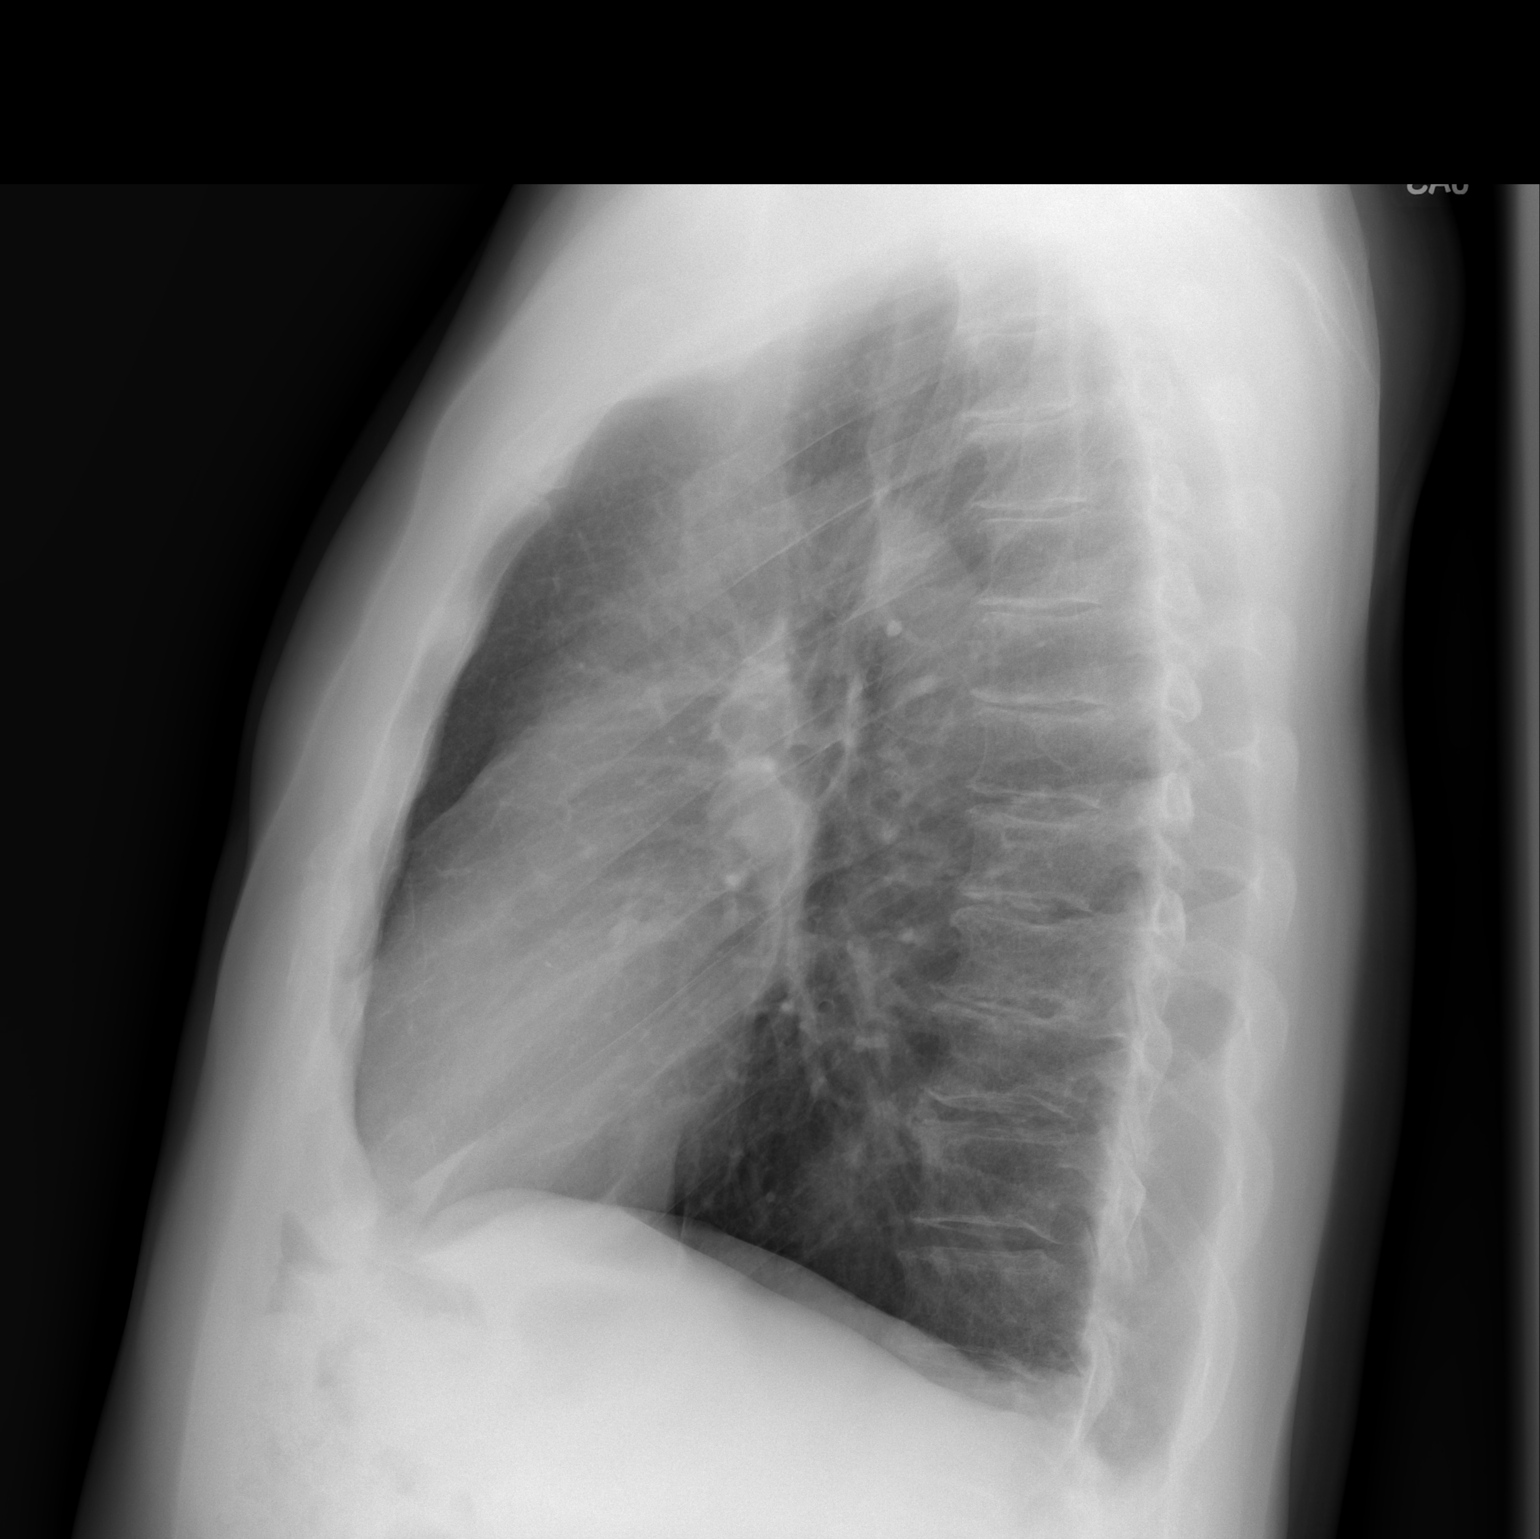

[2 of 2 positions shown; findings below may reference images not displayed]

FINDINGS: Normal heart size, mediastinal contours, and pulmonary vascularity.
Emphysematous changes without acute infiltrate or effusion.
No pneumothorax.
No acute osseous findings.
IMPRESSION: Mild emphysematous changes.

## 2011-05-20 ENCOUNTER — Other Ambulatory Visit: Payer: Self-pay | Admitting: Internal Medicine

## 2011-09-21 ENCOUNTER — Other Ambulatory Visit: Payer: Self-pay | Admitting: Internal Medicine

## 2011-09-29 ENCOUNTER — Encounter: Payer: Self-pay | Admitting: Internal Medicine

## 2011-09-29 ENCOUNTER — Ambulatory Visit (INDEPENDENT_AMBULATORY_CARE_PROVIDER_SITE_OTHER): Payer: Medicare Other | Admitting: Internal Medicine

## 2011-09-29 VITALS — BP 108/62 | HR 97 | Temp 98.8°F | Wt 218.8 lb

## 2011-09-29 DIAGNOSIS — I10 Essential (primary) hypertension: Secondary | ICD-10-CM

## 2011-09-29 DIAGNOSIS — N419 Inflammatory disease of prostate, unspecified: Secondary | ICD-10-CM

## 2011-09-29 DIAGNOSIS — E785 Hyperlipidemia, unspecified: Secondary | ICD-10-CM

## 2011-09-29 NOTE — Assessment & Plan Note (Signed)
BPs usually well controlled, lately has been in the low side. He has prostatitis but does not look toxic. Asked patient to call me if BP is consistently in the low side

## 2011-09-29 NOTE — Assessment & Plan Note (Signed)
Good medication compliance, no change 

## 2011-09-29 NOTE — Patient Instructions (Signed)
Continue with cipro, see Dr. Isabel Caprice Call if you get worse, high fever, weak, BP less than 100/70 consistently or no better in 2 weeks

## 2011-09-29 NOTE — Assessment & Plan Note (Addendum)
History of prostatitis a year ago and again 3 days ago. already feeling better, still tender at the lower abdomen. Plans to see his urologist in 3 days, for now continue with cipro and will call me if symptoms change

## 2011-09-29 NOTE — Progress Notes (Signed)
  Subjective:    Patient ID: Thomas Bradley, male    DOB: [birth year], 65 y.o.   MRN: [id number]  HPI Routine office visit, doing well except for the last few days. He developed fever, dysuria, pelvic pressure. Went to urgent care 3 days ago, diagnosed with prostatitis on clinical grounds (DRE- urinalysis ),  received a shot of abx and prescribed Cipro for 2 weeks. The fever continue up until today, today his temperature is 98.8. Overall he is feeling slightly better.   Past Medical History  Diagnosis Date  . Hyperlipemia   . Hypertension   . Benign prostatic hypertrophy     ED, sees urologly every year  . GERD (gastroesophageal reflux disease)   . Prostatitis     2010, again 09-2011   Past Surgical History  Procedure Date  . Hernia repair 07-2010    open, R inguinal w/ mesh     Review of Systems Medication list reviewed, good compliance. Ambulatory BP is usually 120/70 but lately, his BPs have been in the low side around 101/67. Denies any chest pain, shortness of breath, lower extremity edema, gross hematuria. Has appointment to see his urologist in 3 days.    Objective:   Physical Exam  Constitutional: He appears well-developed and well-nourished. No distress.  HENT:  Head: Normocephalic and atraumatic.  Cardiovascular: Normal rate, regular rhythm and normal heart sounds.   No murmur heard. Pulmonary/Chest: Effort normal and breath sounds normal. No respiratory distress. He has no wheezes. He has no rales.  Abdominal:       Nondistended, soft, good bowel sounds, slightly tender in the lower abdomen  Skin: He is not diaphoretic.      Assessment & Plan:

## 2011-11-20 DIAGNOSIS — M722 Plantar fascial fibromatosis: Secondary | ICD-10-CM | POA: Diagnosis not present

## 2011-11-21 DIAGNOSIS — N529 Male erectile dysfunction, unspecified: Secondary | ICD-10-CM | POA: Diagnosis not present

## 2011-11-21 DIAGNOSIS — N419 Inflammatory disease of prostate, unspecified: Secondary | ICD-10-CM | POA: Diagnosis not present

## 2011-11-21 DIAGNOSIS — N401 Enlarged prostate with lower urinary tract symptoms: Secondary | ICD-10-CM | POA: Diagnosis not present

## 2011-12-04 DIAGNOSIS — M722 Plantar fascial fibromatosis: Secondary | ICD-10-CM | POA: Diagnosis not present

## 2011-12-20 ENCOUNTER — Ambulatory Visit (INDEPENDENT_AMBULATORY_CARE_PROVIDER_SITE_OTHER): Payer: Medicare Other | Admitting: Family Medicine

## 2011-12-20 DIAGNOSIS — B029 Zoster without complications: Secondary | ICD-10-CM

## 2011-12-20 DIAGNOSIS — L501 Idiopathic urticaria: Secondary | ICD-10-CM

## 2011-12-20 MED ORDER — DIPHENHYDRAMINE HCL 50 MG/ML IJ SOLN
50.0000 mg | Freq: Once | INTRAMUSCULAR | Status: AC
  Administered 2011-12-20: 50 mg via INTRAMUSCULAR

## 2011-12-20 MED ORDER — PREDNISONE 20 MG PO TABS: ORAL_TABLET | ORAL | Status: DC

## 2011-12-20 MED ORDER — VALACYCLOVIR HCL 1 G PO TABS: 1000.0000 mg | ORAL_TABLET | Freq: Two times a day (BID) | ORAL | Status: DC

## 2011-12-20 MED ORDER — RANITIDINE HCL 150 MG PO TABS: 150.0000 mg | ORAL_TABLET | Freq: Two times a day (BID) | ORAL | Status: DC

## 2011-12-20 MED ORDER — SODIUM CHLORIDE 0.9 % IV SOLN
125.0000 mg | Freq: Once | INTRAVENOUS | Status: AC
  Administered 2011-12-20: 130 mg via INTRAVENOUS

## 2011-12-20 MED ORDER — EPINEPHRINE 0.3 MG/0.3ML IJ DEVI: 0.3000 mg | Freq: Once | INTRAMUSCULAR | Status: DC

## 2011-12-20 NOTE — Progress Notes (Signed)
  Subjective:    Patient ID: Thomas Bradley, male    DOB: 03-24-1946, 66 y.o.   MRN: [id number]  HPI Patient developed a rash about Monday on his right abdominal wall. Through the course of the week he started breaking out with hives, primarily in the distribution of his underwear and on his feet. He is gotten worse. He has been taking some Benadryl at home. The last he took was about 2 AM today. His feet have been hurting  of from the rash when he gets on his feet Today he had a little tightness sensation in his throat and low. He is breathing fine. Objective  He had his shingles shot 6 months ago. Review of Systems     Objective:   Physical Exam Filed Vitals:   12/20/11 1030  BP: 120/73  Pulse: 89  Temp: 97.9 F (36.6 C)  Resp: 18   Comfortable appearing white male in no acute distress right now however when he stands on his feet he obviously hurting quite a lot. His TMs are normal. Throat was clear. Neck supple without any nodes or thyromegaly. Chest is clear to auscultation. Heart regular without any murmurs. Abdomen soft nontender. He has a band of rash in the right T9 distribution, a narrow band. In the underpants area he has a lot of obvious bright Red hives. Feet are similar with the swelling and and urticaria.       Assessment & Plan:    Urticaria Milld anaphylaxis Shingles  Acute care with steroids, benadryl, observation.  Patient is stable. Has that sensation of difficulty swallowing. He does look a little less oriented though still very prominent. See his orders for his medications.

## 2011-12-20 NOTE — Patient Instructions (Signed)
If at all worse you are to go to the emergency room or to return here.  In the event of inability to breathe well give the EpiPen and get immediate help.   Consider seeing an allergist. Annamaria Helling Hives (urticaria) are itchy, red, swollen patches on the skin. They may change size, shape, and location quickly and repeatedly. Hives that occur deeper in the skin can cause swelling of the hands, feet, and face. Hives may be an allergic reaction to something you or your child ate, touched, or put on the skin. Hives can also be a reaction to cold, heat, viral infections, medication, insect bites, or emotional stress. Often the cause is hard to find. Hives can come and go for several days to several weeks. Hives are not contagious. HOME CARE INSTRUCTIONS   If the cause of the hives is known, avoid exposure to that source.   To relieve itching and rash:   Apply cold compresses to the skin or take cool water baths. Do not take or give your child hot baths or showers because the warmth will make the itching worse.   The best medicine for hives is an antihistamine. An antihistamine will not cure hives, but it will reduce their severity. You can use an antihistamine available over the counter. This medicine may make your child sleepy. Teenagers should not drive while using this medicine.   Take or give an antihistamine every 6 hours until the hives are completely gone for 24 hours or as directed.   Your child may have other medications prescribed for itching. Give these as directed by your child's caregiver.   You or your child should wear loose fitting clothing, including undergarments. Skin irritations may make hives worse.   Follow-up as directed by your caregiver.  SEEK MEDICAL CARE IF:   You or your child still have considerable itching after taking the medication (prescribed or purchased over the counter).   Joint swelling or pain occurs.  SEEK IMMEDIATE MEDICAL CARE IF:   You have a fever.    Swollen lips or tongue are noticed.   There is difficulty with breathing, swallowing, or tightness in the throat or chest.   Abdominal pain develops.   Your child starts acting very sick.  These may be the first signs of a life-threatening allergic reaction. THIS IS AN EMERGENCY. Call 911 for medical help. MAKE SURE YOU:   Understand these instructions.   Will watch your condition.   Will get help right away if you are not doing well or get worse.  Document Released: 10/13/2005 Document Revised: 06/25/2011 Document Reviewed: 06/02/2008 [facility] Patient Information 2012 [place], [place]. may wish to speak with your primary care physician about this.   shinglesShingles Shingles is caused by the same virus that causes chickenpox (varicella zoster virus or VZV). Shingles often occurs many years or decades after having chickenpox. That is why it is more common in adults older than 50 years. The virus reactivates and breaks out as an infection in a nerve root. SYMPTOMS   The initial feeling (sensations) may be pain. This pain is usually described as:   Burning.   Stabbing.   Throbbing.   Tingling in the nerve root.   A red rash will follow in a couple days. The rash may occur in any area of the body and is usually on one side (unilateral) of the body in a band or belt-like pattern. The rash usually starts out as very small blisters (vesicles). They will dry up  after 7 to 10 days. This is not usually a significant problem except for the pain it causes.   Long-lasting (chronic) pain is more likely in an elderly person. It can last months to years. This condition is called postherpetic neuralgia.  Shingles can be an extremely severe infection in someone with AIDS, a weakened immune system, or with forms of leukemia. It can also be severe if you are taking transplant medicines or other medicines that weaken the immune system. TREATMENT  Your caregiver will often treat you  with:  Antiviral drugs.   Anti-inflammatory drugs.   Pain medicines.  Bed rest is very important in preventing the pain associated with herpes zoster (postherpetic neuralgia). Application of heat in the form of a hot water bottle or electric heating pad or gentle pressure with the hand is recommended to help with the pain or discomfort. PREVENTION  A varicella zoster vaccine is available to help protect against the virus. The Food and Drug Administration approved the varicella zoster vaccine for individuals 46 years of age and older. HOME CARE INSTRUCTIONS   Cool compresses to the area of rash may be helpful.   Only take over-the-counter or prescription medicines for pain, discomfort, or fever as directed by your caregiver.   Avoid contact with:   Babies.   Pregnant women.   Children with eczema.   Elderly people with transplants.   People with chronic illnesses, such as leukemia and AIDS.   If the area involved is on your face, you may receive a referral for follow-up to a specialist. It is very important to keep all follow-up appointments. This will help avoid eye complications, chronic pain, or disability.  SEEK IMMEDIATE MEDICAL CARE IF:   You develop any pain (headache) in the area of the face or eye. This must be followed carefully by your caregiver or ophthalmologist. An infection in part of your eye (cornea) can be very serious. It could lead to blindness.   You do not have pain relief from prescribed medicines.   Your redness or swelling spreads.   The area involved becomes very swollen and painful.   You have a fever.   You notice any red or painful lines extending away from the affected area toward your heart (lymphangitis).   Your condition is worsening or has changed.  Document Released: 10/13/2005 Document Revised: 06/25/2011 Document Reviewed: 09/17/2009 [facility] Patient Information 2012 [place], [place].

## 2011-12-22 ENCOUNTER — Emergency Department (HOSPITAL_COMMUNITY)
Admission: EM | Admit: 2011-12-22 | Discharge: 2011-12-22 | Disposition: A | Discharge status: Home / Self Care | Payer: Medicare Other | Attending: Emergency Medicine | Admitting: Emergency Medicine

## 2011-12-22 ENCOUNTER — Other Ambulatory Visit: Payer: Self-pay

## 2011-12-22 ENCOUNTER — Emergency Department (HOSPITAL_COMMUNITY): Payer: Medicare Other

## 2011-12-22 ENCOUNTER — Ambulatory Visit (INDEPENDENT_AMBULATORY_CARE_PROVIDER_SITE_OTHER): Payer: Medicare Other | Admitting: Family Medicine

## 2011-12-22 ENCOUNTER — Telehealth: Payer: Self-pay

## 2011-12-22 VITALS — BP 118/71 | HR 121 | Resp 16

## 2011-12-22 DIAGNOSIS — K219 Gastro-esophageal reflux disease without esophagitis: Secondary | ICD-10-CM | POA: Insufficient documentation

## 2011-12-22 DIAGNOSIS — E785 Hyperlipidemia, unspecified: Secondary | ICD-10-CM | POA: Insufficient documentation

## 2011-12-22 DIAGNOSIS — Z79899 Other long term (current) drug therapy: Secondary | ICD-10-CM | POA: Diagnosis not present

## 2011-12-22 DIAGNOSIS — R079 Chest pain, unspecified: Secondary | ICD-10-CM | POA: Diagnosis not present

## 2011-12-22 DIAGNOSIS — R1310 Dysphagia, unspecified: Secondary | ICD-10-CM | POA: Insufficient documentation

## 2011-12-22 DIAGNOSIS — R220 Localized swelling, mass and lump, head: Secondary | ICD-10-CM | POA: Diagnosis not present

## 2011-12-22 DIAGNOSIS — T782XXA Anaphylactic shock, unspecified, initial encounter: Secondary | ICD-10-CM | POA: Diagnosis not present

## 2011-12-22 DIAGNOSIS — R110 Nausea: Secondary | ICD-10-CM | POA: Insufficient documentation

## 2011-12-22 DIAGNOSIS — R221 Localized swelling, mass and lump, neck: Secondary | ICD-10-CM | POA: Insufficient documentation

## 2011-12-22 DIAGNOSIS — T7840XA Allergy, unspecified, initial encounter: Secondary | ICD-10-CM

## 2011-12-22 DIAGNOSIS — I10 Essential (primary) hypertension: Secondary | ICD-10-CM | POA: Insufficient documentation

## 2011-12-22 DIAGNOSIS — M47814 Spondylosis without myelopathy or radiculopathy, thoracic region: Secondary | ICD-10-CM | POA: Diagnosis not present

## 2011-12-22 LAB — BASIC METABOLIC PANEL
Calcium: 9.7 mg/dL (ref 8.4–10.5)
GFR calc non Af Amer: 71 mL/min — ABNORMAL LOW (ref >90)
Potassium: 3.9 mEq/L (ref 3.5–5.1)
Sodium: 143 mEq/L (ref 135–145)

## 2011-12-22 LAB — CBC
MCH: 30.6 pg (ref 26.0–34.0)
MCV: 88.7 fL (ref 78.0–100.0)
Platelets: 166 10*3/uL (ref 150–400)
RBC: 4.61 MIL/uL (ref 4.22–5.81)
RDW: 14.6 % (ref 11.5–15.5)
WBC: 9.1 10*3/uL (ref 4.0–10.5)

## 2011-12-22 LAB — DIFFERENTIAL
Basophils Absolute: 0 10*3/uL (ref 0.0–0.1)
Eosinophils Absolute: 0 10*3/uL (ref 0.0–0.7)
Eosinophils Relative: 0 % (ref 0–5)
Lymphocytes Relative: 8 % — ABNORMAL LOW (ref 12–46)
Lymphs Abs: 0.7 10*3/uL (ref 0.7–4.0)
Neutrophils Relative %: 85 % — ABNORMAL HIGH (ref 43–77)

## 2011-12-22 LAB — CARDIAC PANEL(CRET KIN+CKTOT+MB+TROPI)
CK, MB: 2.9 ng/mL (ref 0.3–4.0)
Total CK: 399 U/L — ABNORMAL HIGH (ref 7–232)
Troponin I: <0.3 ng/mL (ref <0.30)

## 2011-12-22 MED ORDER — METHYLPREDNISOLONE SODIUM SUCC 125 MG IJ SOLR: 125.0000 mg | Freq: Once | INTRAMUSCULAR | Status: DC

## 2011-12-22 MED ORDER — ONDANSETRON HCL 4 MG/2ML IJ SOLN: 4.0000 mg | Freq: Once | INTRAMUSCULAR | Status: DC

## 2011-12-22 MED ORDER — ASPIRIN EC 325 MG PO TBEC
DELAYED_RELEASE_TABLET | ORAL | Status: AC
  Filled 2011-12-22: qty 1

## 2011-12-22 MED ORDER — EPINEPHRINE 0.3 MG/0.3ML IJ DEVI
0.3000 mg | Freq: Once | INTRAMUSCULAR | Status: AC
Start: 2011-12-22 — End: 2011-12-22
  Administered 2011-12-22: 0.3 mg via INTRAMUSCULAR

## 2011-12-22 MED ORDER — SODIUM CHLORIDE 0.9 % IV BOLUS (SEPSIS): 1000.0000 mL | Freq: Once | INTRAVENOUS | Status: DC

## 2011-12-22 MED ORDER — ASPIRIN 81 MG PO CHEW: 324.0000 mg | CHEWABLE_TABLET | Freq: Once | ORAL | Status: DC

## 2011-12-22 NOTE — ED Provider Notes (Addendum)
History     CSN: [id number]  Arrival date & time 12/22/11  1432   First MD Initiated Contact with Patient 12/22/11 1500      Chief Complaint  Patient presents with  . Chest Pain    (Consider location/radiation/quality/duration/timing/severity/associated sxs/prior treatment) HPI  Past Medical History  Diagnosis Date  . Hyperlipemia   . Hypertension   . Benign prostatic hypertrophy     ED, sees urologly every year  . GERD (gastroesophageal reflux disease)   . Prostatitis     2010, again 09-2011    Past Surgical History  Procedure Date  . Hernia repair 07-2010    open, R inguinal w/ mesh    Family History  Problem Relation Age of Onset  . Coronary artery disease Father   . Heart disease Father   . Diabetes      brothers  . Hypertension Sister   . Stroke Neg Hx   . Colon cancer Neg Hx   . Prostate cancer Neg Hx     History  Substance Use Topics  . Smoking status: Former Smoker    Types: Cigarettes    Quit date: 11/13/1976  . Smokeless tobacco: Not on file  . Alcohol Use: No      Review of Systems  Allergies  Review of patient's allergies indicates no known allergies.  Home Medications   Current Outpatient Rx  Name Route Sig Dispense Refill  . AMLODIPINE BESYLATE 10 MG PO TABS  take 1 tablet by mouth once daily 30 tablet 3  . CALCIUM CARBONATE-VITAMIN D 500-200 MG-UNIT PO TABS Oral Take 1 tablet by mouth daily.      Marland Kitchen CETIRIZINE HCL 10 MG PO TABS Oral Take 10 mg by mouth daily.    Marland Kitchen VITAMIN D-3 PO Oral Take by mouth.      Marland Kitchen CIPROFLOXACIN HCL 500 MG PO TABS Oral Take 500 mg by mouth 2 (two) times daily.     Marland Kitchen DOXAZOSIN MESYLATE 8 MG PO TABS  take 1 tablet by mouth once daily 30 tablet 3  . EPINEPHRINE 0.3 MG/0.3ML IJ DEVI Intramuscular Inject 0.3 mLs (0.3 mg total) into the muscle once. 2 Device 0  . GLUCOSAMINE HCL-MSM 1500-500 MG/30ML PO LIQD Oral Take by mouth daily.      . MULTIVITAMINS PO CAPS Oral Take 1 capsule by mouth daily.      Marland Kitchen FISH  OIL 1000 MG PO CPDR Oral Take by mouth daily.      Marland Kitchen PRAVASTATIN SODIUM 40 MG PO TABS  take 1 tablet by mouth once daily at bedtime 30 tablet 3  . PREDNISONE 20 MG PO TABS  Take 3 tablets daily for 2 days, 2 tablets daily for 2 days, then 1 tablet daily for 2 days. 12 tablet 0  . RANITIDINE HCL 150 MG PO TABS Oral Take 1 tablet (150 mg total) by mouth 2 (two) times daily. 30 tablet 0  . SAW PALMETTO PO Oral Take by mouth.      Marland Kitchen VALACYCLOVIR HCL 1 G PO TABS Oral Take 1 tablet (1,000 mg total) by mouth 2 (two) times daily. 14 tablet 0    There were no vitals taken for this visit.  Physical Exam  ED Course  Procedures (including critical care time)   Labs Reviewed  CBC  DIFFERENTIAL  BASIC METABOLIC PANEL  CARDIAC PANEL(CRET KIN+CKTOT+MB+TROPI)   No results found.  Date: 12/22/2011  Rate: 79  Rhythm: normal sinus rhythm  QRS Axis: normal  Intervals: normal  ST/T Wave abnormalities: normal  Conduction Disutrbances:none  Narrative Interpretation:   Old EKG Reviewed: unchanged    No diagnosis found.    MDM   Patient since Saturday has been treated for shingles and an allergic reaction. He has been taking prednisone, Zantac and today started having swelling of his tongue and difficulty swallowing. He also had a sharp chest pain starting around noon after eating. The pain persisted and when he arrived at urgent care they gave him an EpiPen. Since the EpiPen his symptoms have improved. He states he still feels mildly nauseated but no further tongue swelling her swallowing issues. Pain of the chest is reproduced with palpation. Patient has no cardiac history except for strong family history of MI, hypertension and hyperlipidemia. No prior history of MI in the patient or cardiac surgeries.  He does have presents zoster on his mid abdomen.  Given patient's pain had started prior to epinephrine and a sharp pleuritic pain fever low likelihood of cardiac cause. Will observe patient for 3  additional hours to insure no return of symptoms after the epi and also will check serial markers.  CBC, BMP, cardiac enzymes, EKG, chest x-ray pending. Patient given Solu-Medrol and Zofran to insure no return of symptoms.        Gwyneth Sprout, MD 12/22/11 1610  Gwyneth Sprout, MD 12/22/11 [phone]

## 2011-12-22 NOTE — Telephone Encounter (Signed)
.  [facility]    PT DIAGNOSED WITH SHINGLES ON Saturday,PT'S THROAT RAW AND SWOLLEN, IN LOTS OF  PAIN,PLEASE ADVISE    BEST PHONE [phone]

## 2011-12-22 NOTE — Telephone Encounter (Signed)
Pt was seen in office today. Dr Alwyn Ren treated him

## 2011-12-22 NOTE — Progress Notes (Signed)
IV access L AC 20 gauge and 4 Baby ASA given.

## 2011-12-22 NOTE — ED Notes (Signed)
To ed via ems for eval of cp after having epi injection for possible allergic reaction. Pt came from [facility].

## 2011-12-22 NOTE — Discharge Instructions (Signed)
Allergic Reaction, Mild to Moderate Allergies may happen from anything your body is sensitive to. This may be food, medications, pollens, chemicals, and nearly anything around you in everyday life that produces allergens. An allergen is anything that causes an allergy producing substance. Allergens cause your body to release allergic antibodies. Through a chain of events, they cause a release of histamine into the blood stream. Histamines are meant to protect you, but they also cause your discomfort. This is why antihistamines are often used for allergies. Heredity is often a factor in causing allergic reactions. This means you may have some of the same allergies as your parents. Allergies happen in all age groups. You may have some idea of what caused your reaction. There are many allergens around us. It may be difficult to know what caused your reaction. If this is a first time event, it may never happen again. Allergies cannot be cured but can be controlled with medications. SYMPTOMS  You may get some or all of the following problems from allergies.  Swelling and itching in and around the mouth.   Tearing, itchy eyes.   Nasal congestion and runny nose.   Sneezing and coughing.   An itchy red rash or hives.   Vomiting or diarrhea.   Difficulty breathing.  Seasonal allergies occur in all age groups. They are seasonal because they usually occur during the same season every year. They may be a reaction to molds, grass pollens, or tree pollens. Other causes of allergies are house dust mite allergens, pet dander and mold spores. These are just a common few of the thousands of allergens around us. All of the symptoms listed above happen when you come in contact with pollens and other allergens. Seasonal allergies are usually not life threatening. They are generally more of a nuisance that can often be handled using medications. Hay fever is a combination of all or some of the above listed allergy  problems. It may often be treated with simple over-the-counter medications such as diphenhydramine. Take medication as directed. Check with your caregiver or package insert for child dosages. TREATMENT AND HOME CARE INSTRUCTIONS If hives or rash are present:  Take medications as directed.   You may use an over-the-counter antihistamine (diphenhydramine) for hives and itching as needed. Do not drive or drink alcohol until medications used to treat the reaction have worn off. Antihistamines tend to make people sleepy.   Apply cold cloths (compresses) to the skin or take baths in cool water. This will help itching. Avoid hot baths or showers. Heat will make a rash and itching worse.   If your allergies persist and become more severe, and over the counter medications are not effective, there are many new medications your caretaker can prescribe. Immunotherapy or desensitizing injections can be used if all else fails. Follow up with your caregiver if problems continue.  SEEK MEDICAL CARE IF:   Your allergies are becoming progressively more troublesome.   You suspect a food allergy. Symptoms generally happen within 30 minutes of eating a food.   Your symptoms have not gone away within 2 days or are getting worse.   You develop new symptoms.   You want to retest yourself or your child with a food or drink you think causes an allergic reaction. Never test yourself or your child of a suspected allergy without being under the watchful eye of your caregivers. A second exposure to an allergen may be life-threatening.  SEEK IMMEDIATE MEDICAL CARE IF:  You   develop difficulty breathing or wheezing, or have a tight feeling in your chest or throat.   You develop a swollen mouth, hives, swelling, or itching all over your body.  A severe reaction with any of the above problems should be considered life-threatening. If you suddenly develop difficulty breathing call for local emergency medical help. THIS IS AN  EMERGENCY. MAKE SURE YOU:   Understand these instructions.   Will watch your condition.   Will get help right away if you are not doing well or get worse.  Document Released: 08/10/2007 Document Revised: 06/25/2011 Document Reviewed: 08/10/2007 [facility] Patient Information 2012 [place], [place].  Your caregiver has diagnosed you as having chest pain that is not specific for one problem, but does not require admission.  Chest pain comes from many different causes.  SEEK IMMEDIATE MEDICAL ATTENTION IF: You have severe chest pain, especially if the pain is crushing or pressure-like and spreads to the arms, back, neck, or jaw, or if you have sweating, nausea (feeling sick to your stomach), or shortness of breath. THIS IS AN EMERGENCY. Don't wait to see if the pain will go away. Get medical help at once. Call 911 or 0 (operator). DO NOT drive yourself to the hospital.  Your chest pain gets worse and does not go away with rest.  You have an attack of chest pain lasting longer than usual, despite rest and treatment with the medications your caregiver has prescribed.  You wake from sleep with chest pain or shortness of breath.  You feel dizzy or faint.  You have chest pain not typical of your usual pain for which you originally saw your caregiver.

## 2011-12-22 NOTE — Progress Notes (Signed)
[facility]:  Office Visit  Chief Complaint:  Chief Complaint  Patient presents with  . Shortness of Breath  . Chest Pain  . Oral Swelling    HPI: Thomas Bradley is a 66 y.o. male who complains of: 1. Chest Pain-left anterior chest wall pain, non radiating, crampy pain, started 2 hours ago while at rest. He had 10/10 anterior pain, non radiating, but now it is less since he has been here. However not resolved. Risk factors include HTN, Hyperlidpidemia. Denies Diabetes. Family hx of father who died with MI  At age 70. Denies diaphoresis, N/V, abdominal pain. + SOB, ? Palpitations.   2. Swollen tongue- able to breathe and talk but had sensation that tongue was tied. Unable to swallow pills.  No voice changes. This all occurred after taking Zantac.  3. Patient recently diagnosed with Shingles, hives on groin; on Prednisone and Valtrex.  Past Medical History  Diagnosis Date  . Hyperlipemia   . Hypertension   . Benign prostatic hypertrophy     ED, sees urologly every year  . GERD (gastroesophageal reflux disease)   . Prostatitis     2010, again 09-2011   Past Surgical History  Procedure Date  . Hernia repair 07-2010    open, R inguinal w/ mesh   History   Social History  . Marital Status: Married    Spouse Name: N/A    Number of Children: N/A  . Years of Education: N/A   Social History Main Topics  . Smoking status: Former Smoker    Types: Cigarettes    Quit date: 11/13/1976  . Smokeless tobacco: None  . Alcohol Use: No  . Drug Use: No  . Sexually Active: None   Other Topics Concern  . None   Social History Narrative   Has 3 acres, works in the yard a lot x 6 hours sometimes, feels well.    Exercise- yes joined gym    Family History  Problem Relation Age of Onset  . Coronary artery disease Father   . Heart disease Father   . Diabetes      brothers  . Hypertension Sister   . Stroke Neg Hx   . Colon cancer Neg Hx   . Prostate cancer Neg Hx     No Known Allergies Prior to Admission medications   Medication Sig Start Date End Date Taking? Authorizing Provider  amLODipine (NORVASC) 10 MG tablet take 1 tablet by mouth once daily 09/21/11  Yes Wanda Plump, MD  calcium-vitamin D (OSCAL WITH D 500-200) 500-200 MG-UNIT per tablet Take 1 tablet by mouth daily.     Yes Historical Provider, MD  cetirizine (ZYRTEC) 10 MG tablet Take 10 mg by mouth daily.   Yes Historical Provider, MD  Cholecalciferol (VITAMIN D-3 PO) Take by mouth.     Yes Historical Provider, MD  doxazosin (CARDURA) 8 MG tablet take 1 tablet by mouth once daily 09/21/11  Yes Wanda Plump, MD  EPINEPHrine (EPIPEN) 0.3 mg/0.3 mL DEVI Inject 0.3 mLs (0.3 mg total) into the muscle once. 12/20/11  Yes Janace Hoard, MD  Glucosamine HCl-MSM 1500-500 MG/30ML LIQD Take by mouth daily.     Yes Historical Provider, MD  Multiple Vitamin (MULTIVITAMIN) capsule Take 1 capsule by mouth daily.     Yes Historical Provider, MD  Omega-3 Fatty Acids (FISH OIL) 1000 MG CPDR Take by mouth daily.     Yes Historical Provider, MD  pravastatin (PRAVACHOL) 40 MG tablet  take 1 tablet by mouth once daily at bedtime 09/21/11  Yes Wanda Plump, MD  predniSONE (DELTASONE) 20 MG tablet Take 3 tablets daily for 2 days, 2 tablets daily for 2 days, then 1 tablet daily for 2 days. 12/20/11  Yes Janace Hoard, MD  ranitidine (ZANTAC) 150 MG tablet Take 1 tablet (150 mg total) by mouth 2 (two) times daily. 12/20/11 12/19/12 Yes Janace Hoard, MD  Saw Palmetto, Serenoa repens, (SAW PALMETTO PO) Take by mouth.     Yes Historical Provider, MD  valACYclovir (VALTREX) 1000 MG tablet Take 1 tablet (1,000 mg total) by mouth 2 (two) times daily. 12/20/11 12/19/12 Yes Janace Hoard, MD  ciprofloxacin (CIPRO) 500 MG tablet Take 500 mg by mouth 2 (two) times daily.     Historical Provider, MD     ROS: The patient denies fevers, chills, night sweats, unintentional weight loss, wheezing, dyspnea on exertion, nausea, vomiting, abdominal  pain, dysuria, hematuria, melena, numbness, weakness, or tingling. + chest pain, SOB  All other systems have been reviewed and were otherwise negative with the exception of those mentioned in the HPI and as above.    PHYSICAL EXAM: Filed Vitals:   12/22/11 1320  BP: 118/71  Pulse: 121  Resp: 16   There were no vitals filed for this visit. There is no height or weight on file to calculate BMI.  General: Alert, anxious, mild distress HEENT:  Normocephalic, atraumatic, oropharynx patent. Tongue not swollen.  Cardiovascular:  Tachycardic with  regular rhythm, no rubs murmurs or gallops.  No Carotid bruits, radial pulse intact. No pedal edema.  Respiratory: Clear to auscultation bilaterally.  No wheezes, rales, or rhonchi.  No cyanosis, no use of accessory musculature GI: No organomegaly, abdomen is soft and non-tender, positive bowel sounds.  No masses. Skin: + shingles rash and hives on groin Neurologic: Facial musculature symmetric. Psychiatric: Patient is appropriate throughout our interaction. Lymphatic: No cervical lymphadenopathy Musculoskeletal: Gait intact.   LABS:EKG/XRAY:   Primary read interpreted by Dr. Conley Rolls at [facility]. EKG NSR 86 bpm. NO ST changes   ASSESSMENT/PLAN: Encounter Diagnoses  Name Primary?  Marland Kitchen Anaphylaxis Yes  . Chest pain at rest     1. Was given Epi pen x 1, Felt better.  2. Chest pain did not improve,  No sxs relief after given epi injection for questionable anaphylaxis.  Hx of HTN, Hyperlipidemia and Strong family hx.  with Father who died of an MI at age 37 y/o. Patient was given oxygen and ASA 325 mg x 1. Sending patient to ER for further evaluation.      [name], DO 12/22/2011 1:56 PM

## 2011-12-22 NOTE — ED Provider Notes (Signed)
History     CSN: [id number]  Arrival date & time 12/22/11  1432   First MD Initiated Contact with Patient 12/22/11 1500      Chief Complaint  Patient presents with  . Chest Pain     Patient is a 66 y.o. male presenting with chest pain. The history is provided by the patient and the spouse.  Chest Pain Episode onset: today. Episode Length: several hours. Chest pain occurs constantly. The chest pain is unchanged. Associated with: palpation. The severity of the pain is mild. The quality of the pain is described as aching. The pain does not radiate. Chest pain is worsened by certain positions. Pertinent negatives for primary symptoms include no fever, no shortness of breath and no vomiting.  Pertinent negatives for associated symptoms include no diaphoresis.   pt sent from urgent care for evaluation of allergic rxn and chest pain Pt was given epipen at the office - his CP was prior to the epipen being given to patient (pt now has Rx for epipen as well)  Past Medical History  Diagnosis Date  . Hyperlipemia   . Hypertension   . Benign prostatic hypertrophy     ED, sees urologly every year  . GERD (gastroesophageal reflux disease)   . Prostatitis     2010, again 09-2011    Past Surgical History  Procedure Date  . Hernia repair 07-2010    open, R inguinal w/ mesh    Family History  Problem Relation Age of Onset  . Coronary artery disease Father   . Heart disease Father   . Diabetes      brothers  . Hypertension Sister   . Stroke Neg Hx   . Colon cancer Neg Hx   . Prostate cancer Neg Hx     History  Substance Use Topics  . Smoking status: Former Smoker    Types: Cigarettes    Quit date: 11/13/1976  . Smokeless tobacco: Not on file  . Alcohol Use: No      Review of Systems  Constitutional: Negative for fever and diaphoresis.  Respiratory: Negative for shortness of breath.   Cardiovascular: Positive for chest pain.  Gastrointestinal: Negative for vomiting.  All  other systems reviewed and are negative.    Allergies  Review of patient's allergies indicates no known allergies.  Home Medications   Current Outpatient Rx  Name Route Sig Dispense Refill  . AMLODIPINE BESYLATE 10 MG PO TABS  take 1 tablet by mouth once daily 30 tablet 3  . CETIRIZINE HCL 10 MG PO TABS Oral Take 10 mg by mouth daily.    Marland Kitchen VITAMIN D-3 PO Oral Take 1 tablet by mouth daily.     Marland Kitchen DOXAZOSIN MESYLATE 8 MG PO TABS  take 1 tablet by mouth once daily 30 tablet 3  . EPINEPHRINE 0.3 MG/0.3ML IJ DEVI Intramuscular Inject 0.3 mLs (0.3 mg total) into the muscle once. 2 Device 0  . MULTIVITAMINS PO CAPS Oral Take 1 capsule by mouth daily.      Marland Kitchen PRAVASTATIN SODIUM 40 MG PO TABS  take 1 tablet by mouth once daily at bedtime 30 tablet 3  . PREDNISONE 20 MG PO TABS  Take 3 tablets daily for 2 days, 2 tablets daily for 2 days, then 1 tablet daily for 2 days. 12 tablet 0  . RANITIDINE HCL 150 MG PO TABS Oral Take 1 tablet (150 mg total) by mouth 2 (two) times daily. 30 tablet 0  . VALACYCLOVIR HCL  1 G PO TABS Oral Take 1 tablet (1,000 mg total) by mouth 2 (two) times daily. 14 tablet 0   BP 123/74  Pulse 79  Temp(Src) 98.7 F (37.1 C) (Oral)  Resp 10  SpO2 96% Physical Exam CONSTITUTIONAL: Well developed/well nourished HEAD AND FACE: Normocephalic/atraumatic EYES: EOMI/PERRL ENMT: Mucous membranes moist, no angioedema, uvula midline.  No stridor NECK: supple no meningeal signs SPINE:entire spine nontender CV: S1/S2 noted, no murmurs/rubs/gallops noted LUNGS: Lungs are clear to auscultation bilaterally, no apparent distress Chest -tender to palpation, no crepitance ABDOMEN: soft, nontender, no rebound or guarding GU:no cva tenderness NEURO: Pt is awake/alert, moves all extremitiesx4 EXTREMITIES: pulses normal, full ROM SKIN: warm, color normal.  One patch of zoster noted to right abdomen No urticaria noted PSYCH: no abnormalities of mood noted  ED Course  Procedures    Labs Reviewed  DIFFERENTIAL - Abnormal; Notable for the following:    Neutrophils Relative 85 (*)    Lymphocytes Relative 8 (*)    All other components within normal limits  BASIC METABOLIC PANEL - Abnormal; Notable for the following:    Glucose, Bld 186 (*)    BUN 26 (*)    GFR calc non Af Amer 71 (*)    GFR calc Af Amer 82 (*)    All other components within normal limits  CARDIAC PANEL(CRET KIN+CKTOT+MB+TROPI) - Abnormal; Notable for the following:    Total CK 399 (*)    All other components within normal limits  CBC  TROPONIN I   Dg Chest 2 View  12/22/2011  *RADIOLOGY REPORT*  Clinical Data: Chest pain  CHEST - 2 VIEW  Comparison: 08/09/2010  Findings: The heart size and mediastinal contours are within normal limits.  Both lungs are clear.  The visualized skeletal structures are significant for mild thoracic spondylosis.  IMPRESSION:  1.  No acute cardiopulmonary abnormalities. 2.  Mild thoracic spondylosis.  Original Report Authenticated By: Rosealee Albee, M.D.    4:10 PM Pt well appearing, will check second troponin and ensure no worsening of reaction.  No angioedema noted at this time Already has prednisone at home.  He feels this tongue swelling caused by zantac and he will stop this.  I do not see ACE inhibitor on his list  5:57 PM Pt without any worsening, he feels improved For his CP, well appearing, doubt ACS/PE/Dissection at this time Also, no signs of worsening allergic rxn s/p epipen at urgent care Discussed signs/symptoms of worsening allergic rxn and when to use his epipen He is going to stop all of his meds that were recently given for tonight  The patient appears reasonably screened and/or stabilized for discharge and I doubt any other medical condition or other [facility] requiring further screening, evaluation, or treatment in the ED at this time prior to discharge.   MDM  Nursing notes reviewed and considered in documentation Previous records reviewed and  considered xrays reviewed and considered All labs/vitals reviewed and considered      Date: 12/22/2011  Rate: 79  Rhythm: normal sinus rhythm  QRS Axis: normal  Intervals: normal  ST/T Wave abnormalities: nonspecific ST changes  Conduction Disutrbances:none  Narrative Interpretation:   Old EKG Reviewed: unchanged      Joya Gaskins, MD 12/22/11 1758

## 2011-12-25 ENCOUNTER — Ambulatory Visit (INDEPENDENT_AMBULATORY_CARE_PROVIDER_SITE_OTHER): Payer: Medicare Other | Admitting: Family Medicine

## 2011-12-25 DIAGNOSIS — L519 Erythema multiforme, unspecified: Secondary | ICD-10-CM | POA: Diagnosis not present

## 2011-12-25 DIAGNOSIS — B029 Zoster without complications: Secondary | ICD-10-CM

## 2011-12-25 MED ORDER — DIPHENHYDRAMINE HCL 50 MG/ML IJ SOLN
50.0000 mg | Freq: Once | INTRAMUSCULAR | Status: AC
  Administered 2011-12-25: 50 mg via INTRAMUSCULAR

## 2011-12-25 MED ORDER — PREDNISONE 20 MG PO TABS: ORAL_TABLET | ORAL | Status: DC

## 2011-12-25 MED ORDER — ALPRAZOLAM 1 MG PO TABS: ORAL_TABLET | ORAL | Status: DC

## 2011-12-25 MED ORDER — HYDROXYZINE PAMOATE 25 MG PO CAPS: ORAL_CAPSULE | ORAL | Status: DC

## 2011-12-25 NOTE — Progress Notes (Signed)
Subjective: Patient is back with more rash on his palms today. Has some on his trunk and right lower quadrant of the abdomen. He is getting just a little bit on his feet. The shingles is continued to drop.  Objective erythema multiforme he appearing rash with urticarial areas in the distribution noted above. The palms are terrible with redness, swelling, and erythematous multiform  appearing pattern.   Assessment: Erythema multiforme he Shingles  Plan: See his discharge instruction sheet

## 2011-12-25 NOTE — Patient Instructions (Signed)
Continue to take:  over-the-counter Zyrtec once or twice daily,  ranitidine (Zantac) 150 mg twice daily Prednisone 20 mg. Will begin a new taper with 3 daily for 2 days, daily for 4 days, one daily for 4 days, then one half daily for about 4 days. Am adding Xanax for relaxation and sleep, and Vistaril which is another agent for itching which also has a calming affect  Return in one week, sooner if worse.  Erythema Multiforme Erythema multiforme (EM) is a rash that occurs mostly on the skin. Sometimes it occurs on the lips and mouth. It is usually a mild illness that goes away on its own. It usually affects young adults in the spring and fall. It tends to be recurrent with each episode lasting 1 to 4 weeks. CAUSES  The cause of EM may be an overreaction by the body's immune system to a trigger (something that causes the body to react).  Common triggers include:  Infections, including:   Viruses.   Bacteria.   Fungi.   Parasites.   Medicines.  Less common triggers include:  Foods.   Chemicals.   Injuries to the skin.   Pregnancy.   Other illnesses.  In some cases the cause may not be known. SYMPTOMS  The rash from EM shows up suddenly. The rash may appear days after the trigger. It may start as small, red, round or oval marks that become bumps or raised welts over 24 to 48 hours. These can spread and be quite large (about one inch [several centimeters]). These skin changes usually appear first on the backs of the hands, then spread to the tops of the feet, arms, elbows, knees, palms and soles. There may be a mild rash on the lips and lining of the mouth. The skin rash may show up in waves over a few days. There may be mild itching or burning of the skin at first. It may take up to 4 weeks to go away. The rash may come back again at a later time. DIAGNOSIS  Diagnosis of EM is usually made by physical exam. Sometimes a skin biopsy is done if the diagnosis is not certain. A skin  biopsy is the removal of a small piece of tissue which can be examined under a microscope by a specialist (pathologist). TREATMENT  Most episodes of EM heal on their own and treatment may not be needed. If possible, it is best to remove the trigger or treat the infection. If your trigger is a herpes virus infection (cold sore), use sunscreen lotion and sunscreen-containing lip balm to prevent sunlight triggered outbreaks of herpes virus. Medicine for itching may be given. Medicines can be used for severe cases and to prevent repeat bouts of EM.  HOME CARE INSTRUCTIONS   If possible, avoid known triggers.   If a medicine was your trigger, be sure to notify all of your caregivers. You should avoid this medicine or any like it in the future.  SEEK MEDICAL CARE IF:   Your EM rash shows up again in the future  SEEK IMMEDIATE MEDICAL CARE IF:   Red, swollen lips or mouth develop.   Burning feeling in the mouth or lips.   Blisters or open sores in the mouth, lips, vagina, penis or anus.   Eye pain, redness or drainage.   Blisters on the skin.   Difficulty breathing.   Difficulty swallowing; drooling.   Blood in urine.   Pain with urinating.  Document Released: 10/13/2005 Document Revised:  06/25/2011 Document Reviewed: 09/29/2008 [facility] Patient Information 2012 [place].

## 2011-12-26 ENCOUNTER — Encounter: Payer: Self-pay | Admitting: Family Medicine

## 2011-12-28 ENCOUNTER — Ambulatory Visit (INDEPENDENT_AMBULATORY_CARE_PROVIDER_SITE_OTHER): Payer: Medicare Other | Admitting: Internal Medicine

## 2011-12-28 DIAGNOSIS — L519 Erythema multiforme, unspecified: Secondary | ICD-10-CM

## 2011-12-28 DIAGNOSIS — L509 Urticaria, unspecified: Secondary | ICD-10-CM | POA: Diagnosis not present

## 2011-12-28 DIAGNOSIS — L299 Pruritus, unspecified: Secondary | ICD-10-CM | POA: Diagnosis not present

## 2011-12-28 MED ORDER — DIPHENHYDRAMINE HCL 50 MG/ML IJ SOLN
50.0000 mg | Freq: Once | INTRAMUSCULAR | Status: AC
  Administered 2011-12-28: 50 mg via INTRAMUSCULAR

## 2011-12-28 MED ORDER — DOXEPIN HCL 50 MG PO CAPS: 150.0000 mg | ORAL_CAPSULE | Freq: Every day | ORAL | Status: DC

## 2011-12-28 MED ORDER — HYDROXYZINE PAMOATE 50 MG PO CAPS: ORAL_CAPSULE | ORAL | Status: DC

## 2011-12-28 MED ORDER — TRIAMCINOLONE ACETONIDE 0.1 % EX CREA: TOPICAL_CREAM | Freq: Two times a day (BID) | CUTANEOUS | Status: DC

## 2011-12-28 NOTE — Progress Notes (Signed)
  Subjective:    Patient ID: Thomas Bradley, male    DOB: [birth year], 66 y.o.   MRN: [id number]  HPI  66yo CM presents w/ recurrent hives.  Really started up again yesterday in B axilla.  Very pruritic. Also present in groin, chest, trunk.  Has taken Zyrtec today.  See previous notes.  The etiology of the rash is unclear.  He has stopped any NSAIDS, no zantac, no valtrex, no new foods, no new meds, no new OTC.  Rash responds best to IM benadryl.  Still on Prednisone and is tapering down.  No SOB, throat feels fine and doesn't feel as though it is swelling. No wheezing  Review of Systems  All other systems reviewed and are negative.       Objective:   Physical Exam  Constitutional: He is oriented to person, place, and time. He appears well-developed and well-nourished.  HENT:  Head: Normocephalic and atraumatic.  Mouth/Throat: Oropharynx is clear and moist. No oropharyngeal exudate (pharynx and posterior pharynx patent and normal without any swelling).  Neck: Normal range of motion. Neck supple.  Cardiovascular: Normal rate, regular rhythm and normal heart sounds.  Exam reveals no gallop and no friction rub.   No murmur heard. Pulmonary/Chest: Effort normal and breath sounds normal. He has no wheezes.  Lymphadenopathy:    He has no cervical adenopathy.  Neurological: He is alert and oriented to person, place, and time.  Skin: Skin is warm and dry. Rash (coalescing urticaria presnt on B extremities, trunk, chest, and B axilla) noted.  Psychiatric: He has a normal mood and affect. His behavior is normal.          Assessment & Plan:  Hypersensitivity reaction.  See meds.

## 2011-12-28 NOTE — Patient Instructions (Signed)
Benadryl 50 mg every 6 hrs in the day time then vistaril instead at night.  Increase Zyrtec to 10mg  bid.  Add doxepin.

## 2012-01-05 ENCOUNTER — Encounter: Payer: Self-pay | Admitting: Internal Medicine

## 2012-01-05 ENCOUNTER — Ambulatory Visit (INDEPENDENT_AMBULATORY_CARE_PROVIDER_SITE_OTHER): Payer: Medicare Other | Admitting: Internal Medicine

## 2012-01-05 DIAGNOSIS — L519 Erythema multiforme, unspecified: Secondary | ICD-10-CM | POA: Diagnosis not present

## 2012-01-05 DIAGNOSIS — B029 Zoster without complications: Secondary | ICD-10-CM | POA: Diagnosis not present

## 2012-01-05 MED ORDER — VALACYCLOVIR HCL 1 G PO TABS: 500.0000 mg | ORAL_TABLET | Freq: Two times a day (BID) | ORAL | Status: DC

## 2012-01-05 NOTE — Patient Instructions (Addendum)
Erythema Multiforme Erythema multiforme (EM) is a rash that occurs mostly on the skin. Sometimes it occurs on the lips and mouth. It is usually a mild illness that goes away on its own. It usually affects young adults in the spring and fall. It tends to be recurrent with each episode lasting 1 to 4 weeks. CAUSES  The cause of EM may be an overreaction by the body's immune system to a trigger (something that causes the body to react).  Common triggers include:  Infections, including:   Viruses.   Bacteria.   Fungi.   Parasites.   Medicines.  Less common triggers include:  Foods.   Chemicals.   Injuries to the skin.   Pregnancy.   Other illnesses.  In some cases the cause may not be known. SYMPTOMS  The rash from EM shows up suddenly. The rash may appear days after the trigger. It may start as small, red, round or oval marks that become bumps or raised welts over 24 to 48 hours. These can spread and be quite large (about one inch [several centimeters]). These skin changes usually appear first on the backs of the hands, then spread to the tops of the feet, arms, elbows, knees, palms and soles. There may be a mild rash on the lips and lining of the mouth. The skin rash may show up in waves over a few days. There may be mild itching or burning of the skin at first. It may take up to 4 weeks to go away. The rash may come back again at a later time. DIAGNOSIS  Diagnosis of EM is usually made by physical exam. Sometimes a skin biopsy is done if the diagnosis is not certain. A skin biopsy is the removal of a small piece of tissue which can be examined under a microscope by a specialist (pathologist). TREATMENT  Most episodes of EM heal on their own and treatment may not be needed. If possible, it is best to remove the trigger or treat the infection. If your trigger is a herpes virus infection (cold sore), use sunscreen lotion and sunscreen-containing lip balm to prevent sunlight triggered  outbreaks of herpes virus. Medicine for itching may be given. Medicines can be used for severe cases and to prevent repeat bouts of EM.  HOME CARE INSTRUCTIONS   If possible, avoid known triggers.   If a medicine was your trigger, be sure to notify all of your caregivers. You should avoid this medicine or any like it in the future.  SEEK MEDICAL CARE IF:   Your EM rash shows up again in the future  SEEK IMMEDIATE MEDICAL CARE IF:   Red, swollen lips or mouth develop.   Burning feeling in the mouth or lips.   Blisters or open sores in the mouth, lips, vagina, penis or anus.   Eye pain, redness or drainage.   Blisters on the skin.   Difficulty breathing.   Difficulty swallowing; drooling.   Blood in urine.   Pain with urinating.  Document Released: 10/13/2005 Document Revised: 10/02/2011 Document Reviewed: 09/29/2008 [facility] Patient Information 2012 [place].  Take prednisone until done Start Valtrex Call if fever, chills, eye-tongue  problems We'll refer you to the  ID specialist

## 2012-01-05 NOTE — Assessment & Plan Note (Addendum)
Agree with the diagnosis of erythema multiforme. Currently getting slightly better. Most likely trigger was the shingles infection noting that he took antibiotics in December for prostatitis including Bactrim and also he take NSAIDs. Given the relationship between the shingles on the rash I do think the herpes virus is the culprit. I have carefully reviewed my thoughts with the patient and family. Plan: Valtrex 400 mg twice a day until he sees infectious diseases for confirmation, he will be referred.

## 2012-01-05 NOTE — Progress Notes (Signed)
  Subjective:    Patient ID: Thomas Bradley, male    DOB: [birth year], 66 y.o.   MRN: [id number]  HPI Acute visit, here with his wife. His problems started around 12/20/2011 when he developed a R sided T9 rash distribution, shortly after he also developed itching and blisters in the feet hand. Went to urgent care, diagnosed with shingles and prescribed Valtrex. On 12/22/2011 he got worse, went again to the urgent care, was rx a epipen and  then referred to the ER as he was having chest pain. ER workup was unremarkable for chest pain and he was released home. Since then, has been seen at the urgent care at least twice, the wife reports that at some point the rash was generalized, he has severe blisters and itching of the hands, feet. His tonge and lips were also mildly involved. He was prescribed prednisone. Overall he feels better now, rash has decreased, feet are back to normal, still itches in the hands.  Past Medical History  Diagnosis Date  . Hyperlipemia   . Hypertension   . Benign prostatic hypertrophy     ED, sees urologly every year  . GERD (gastroesophageal reflux disease)   . Prostatitis     2010, again 09-2011   Past Surgical History  Procedure Date  . Hernia repair 07-2010    open, R inguinal w/ mesh     Review of Systems No fever or chills No shortness of breath     Objective:   Physical Exam  Constitutional: He is oriented to person, place, and time. He appears well-developed and well-nourished.  HENT:  Head: Normocephalic and atraumatic.       Lips normal  Cardiovascular: Normal rate, regular rhythm and normal heart sounds.   No murmur heard. Pulmonary/Chest: Effort normal and breath sounds normal. No respiratory distress. He has no wheezes. He has no rales.  Musculoskeletal: He exhibits no edema.  Neurological: He is alert and oriented to person, place, and time.  Skin: Skin is warm and dry.       At the time of this visit, her rash is mostly located in the  abdomen, back and chest, some in the under arms. The buttocks and groin are also involved. Lower extremities from the knee down are free of lesions and they were never involved. Has anular rash, borders are flat or slt elevated, size varies from half to 1 cm, some are confluent. Palms are free of blisters but still red  Psychiatric: He has a normal mood and affect. His behavior is normal. Judgment and thought content normal.          Assessment & Plan:  Today , I spent more than 25  min with the patient, >50% of the time counseling about the dx

## 2012-01-15 ENCOUNTER — Other Ambulatory Visit: Payer: Self-pay | Admitting: Internal Medicine

## 2012-01-15 DIAGNOSIS — R21 Rash and other nonspecific skin eruption: Secondary | ICD-10-CM

## 2012-01-16 DIAGNOSIS — L259 Unspecified contact dermatitis, unspecified cause: Secondary | ICD-10-CM | POA: Diagnosis not present

## 2012-01-16 NOTE — Progress Notes (Signed)
Addended by: Candie Echevaria L on: 01/16/2012 09:27 AM   Modules accepted: Orders

## 2012-01-23 ENCOUNTER — Other Ambulatory Visit: Payer: Self-pay | Admitting: Internal Medicine

## 2012-01-26 NOTE — Telephone Encounter (Signed)
Refill done.  

## 2012-03-08 ENCOUNTER — Encounter: Payer: Self-pay | Admitting: Internal Medicine

## 2012-03-08 ENCOUNTER — Ambulatory Visit (INDEPENDENT_AMBULATORY_CARE_PROVIDER_SITE_OTHER): Payer: Medicare Other | Admitting: Internal Medicine

## 2012-03-08 VITALS — BP 122/78 | HR 73 | Temp 97.6°F | Ht 73.0 in | Wt 220.0 lb

## 2012-03-08 DIAGNOSIS — E785 Hyperlipidemia, unspecified: Secondary | ICD-10-CM | POA: Diagnosis not present

## 2012-03-08 DIAGNOSIS — N400 Enlarged prostate without lower urinary tract symptoms: Secondary | ICD-10-CM

## 2012-03-08 DIAGNOSIS — M1990 Unspecified osteoarthritis, unspecified site: Secondary | ICD-10-CM | POA: Insufficient documentation

## 2012-03-08 DIAGNOSIS — M25519 Pain in unspecified shoulder: Secondary | ICD-10-CM

## 2012-03-08 DIAGNOSIS — I10 Essential (primary) hypertension: Secondary | ICD-10-CM | POA: Diagnosis not present

## 2012-03-08 DIAGNOSIS — R748 Abnormal levels of other serum enzymes: Secondary | ICD-10-CM

## 2012-03-08 DIAGNOSIS — Z0000 Encounter for general adult medical examination without abnormal findings: Secondary | ICD-10-CM

## 2012-03-08 LAB — LIPID PANEL
LDL Cholesterol: 99 mg/dL (ref 0–99)
Total CHOL/HDL Ratio: 4
VLDL: 21.6 mg/dL (ref 0.0–40.0)

## 2012-03-08 LAB — ALT: ALT: 19 U/L (ref 0–53)

## 2012-03-08 LAB — TSH: TSH: 1.8 u[IU]/mL (ref 0.35–5.50)

## 2012-03-08 LAB — AST: AST: 21 U/L (ref 0–37)

## 2012-03-08 NOTE — Assessment & Plan Note (Signed)
Td 2006 and 01-2011 pneumonia shot 2010 Had a Shingles shot already  colonoscopy 2004,Dr Santogade, colonoscopy again 05-2010, hyperplastic polyps, repeat in 2016 sees urology q year cont healthy life style rec ASA 81 qd

## 2012-03-08 NOTE — Assessment & Plan Note (Signed)
Due for labs

## 2012-03-08 NOTE — Assessment & Plan Note (Signed)
Well controlled, last BMP ok, no change

## 2012-03-08 NOTE — Progress Notes (Signed)
Subjective:    Patient ID: Thomas Bradley, male    DOB: 1946/02/27, 66 y.o.   MRN: [id number]  HPI Here for Medicare AWV: 1. Risk factors based on Past M, S, F history: reviewed 2. Physical Activities:  Active, [employer] exercise routinely  3. Depression/mood:  No problemss noted or reported  4. Hearing:  Some tinnitus, slt decreased R hearing, checked before, was offered hearing aids, he declined  5. ADL's:  Totally independent  6. Fall Risk: no h/o , low risk 7. home Safety: does feel safe at home  8. Height, weight, &visual acuity: see VS, uses glasses , eyes are checked yearly  9. Counseling: provided 10. Labs ordered based on risk factors: if needed  11. Referral Coordination: if needed 12.  Care Plan, see assessment and plan  13.   Cognitive Assessment:motor skills and cognition wnl  In addition, today we discussed the following: Erythema multiform, essentially resolved. Hypertension, good medication compliance, ambulatory BPs 117/74 High cholesterol, good medication compliance, no apparent side effects. Insomnia, use to take alprazolam for sleep but currently doing well without any medication. Also 2 months history of right shoulder pain, day or night, is worse when he tried to lift things with his right arm. Sometimes the pain is spontaneous.  Past Medical History  Diagnosis Date  . Hyperlipemia   . Hypertension   . Benign prostatic hypertrophy     ED, sees urologly every year  . GERD (gastroesophageal reflux disease)   . Prostatitis     2010, again 09-2011  . Erythema multiforme     dx 2013    Past Surgical History  Procedure Date  . Hernia repair 07-2010    open, R inguinal w/ mesh   History   Social History  . Marital Status: Married    Spouse Name: N/A    Number of Children: 2  . Years of Education: N/A   Occupational History  . retired     Social History Main Topics  . Smoking status: Former Smoker    Types: Cigarettes    Quit date: 11/13/1976  .  Smokeless tobacco: Never Used  . Alcohol Use: No  . Drug Use: No  . Sexually Active: Not on file   Other Topics Concern  . Not on file   Social History Narrative   Has 3 acres, works in the yard a lot x 6 hours sometimes, feels well.  Exercise- yes joined gym    Family History  Problem Relation Age of Onset  . Coronary artery disease Father 91  . Diabetes      brothers  . Hypertension Sister   . Stroke Neg Hx   . Colon cancer Neg Hx   . Prostate cancer Neg Hx     Review of Systems No chest pain or shortness of breath No nausea, vomiting, diarrhea or blood in the stools.     Objective:   Physical Exam  General -- alert, well-developed, and well-nourished.   Neck --no thyromegaly , normal carotid pulse Lungs -- normal respiratory effort, no intercostal retractions, no accessory muscle use, and normal breath sounds.   Heart-- normal rate, regular rhythm, no murmur, and no gallop.   Abdomen--soft, non-tender, no distention, no masses, no HSM, no guarding, and no rigidity.   Extremities-- no pretibial edema bilaterally; range of motion of the left shoulder is normal, range of motion of the right shoulder is okay however he does have pain with arm elevation and passive rotation. Neurologic-- alert &  oriented X3 and strength normal in all extremities. Psych-- Cognition and judgment appear intact. Alert and cooperative with normal attention span and concentration.  not anxious appearing and not depressed appearing.        Assessment & Plan:

## 2012-03-08 NOTE — Assessment & Plan Note (Signed)
Last CK slt elevated, recheck

## 2012-03-08 NOTE — Assessment & Plan Note (Signed)
Request a referral to see orthopedic surgery. Will do

## 2012-03-08 NOTE — Assessment & Plan Note (Signed)
Sees urology routinely, essentially  asx

## 2012-03-09 ENCOUNTER — Encounter: Payer: Self-pay | Admitting: *Deleted

## 2012-03-10 DIAGNOSIS — M25519 Pain in unspecified shoulder: Secondary | ICD-10-CM | POA: Diagnosis not present

## 2012-04-07 DIAGNOSIS — M75120 Complete rotator cuff tear or rupture of unspecified shoulder, not specified as traumatic: Secondary | ICD-10-CM | POA: Diagnosis not present

## 2012-04-09 DIAGNOSIS — M25519 Pain in unspecified shoulder: Secondary | ICD-10-CM | POA: Diagnosis not present

## 2012-04-14 DIAGNOSIS — M25519 Pain in unspecified shoulder: Secondary | ICD-10-CM | POA: Diagnosis not present

## 2012-04-16 DIAGNOSIS — M25519 Pain in unspecified shoulder: Secondary | ICD-10-CM | POA: Diagnosis not present

## 2012-04-20 DIAGNOSIS — M25519 Pain in unspecified shoulder: Secondary | ICD-10-CM | POA: Diagnosis not present

## 2012-04-22 DIAGNOSIS — M25519 Pain in unspecified shoulder: Secondary | ICD-10-CM | POA: Diagnosis not present

## 2012-04-27 DIAGNOSIS — M25519 Pain in unspecified shoulder: Secondary | ICD-10-CM | POA: Diagnosis not present

## 2012-04-30 DIAGNOSIS — M25519 Pain in unspecified shoulder: Secondary | ICD-10-CM | POA: Diagnosis not present

## 2012-05-04 DIAGNOSIS — M25519 Pain in unspecified shoulder: Secondary | ICD-10-CM | POA: Diagnosis not present

## 2012-05-07 DIAGNOSIS — M25519 Pain in unspecified shoulder: Secondary | ICD-10-CM | POA: Diagnosis not present

## 2012-05-19 DIAGNOSIS — M76899 Other specified enthesopathies of unspecified lower limb, excluding foot: Secondary | ICD-10-CM | POA: Diagnosis not present

## 2012-05-19 DIAGNOSIS — M25559 Pain in unspecified hip: Secondary | ICD-10-CM | POA: Diagnosis not present

## 2012-06-16 IMAGING — CR DG CHEST 2V
2 series · 2 of 2 positions shown · non-contrast
Comparison: 08/09/2010

CLINICAL DATA: Chest pain

CHEST - 2 VIEW

[w chest pa]
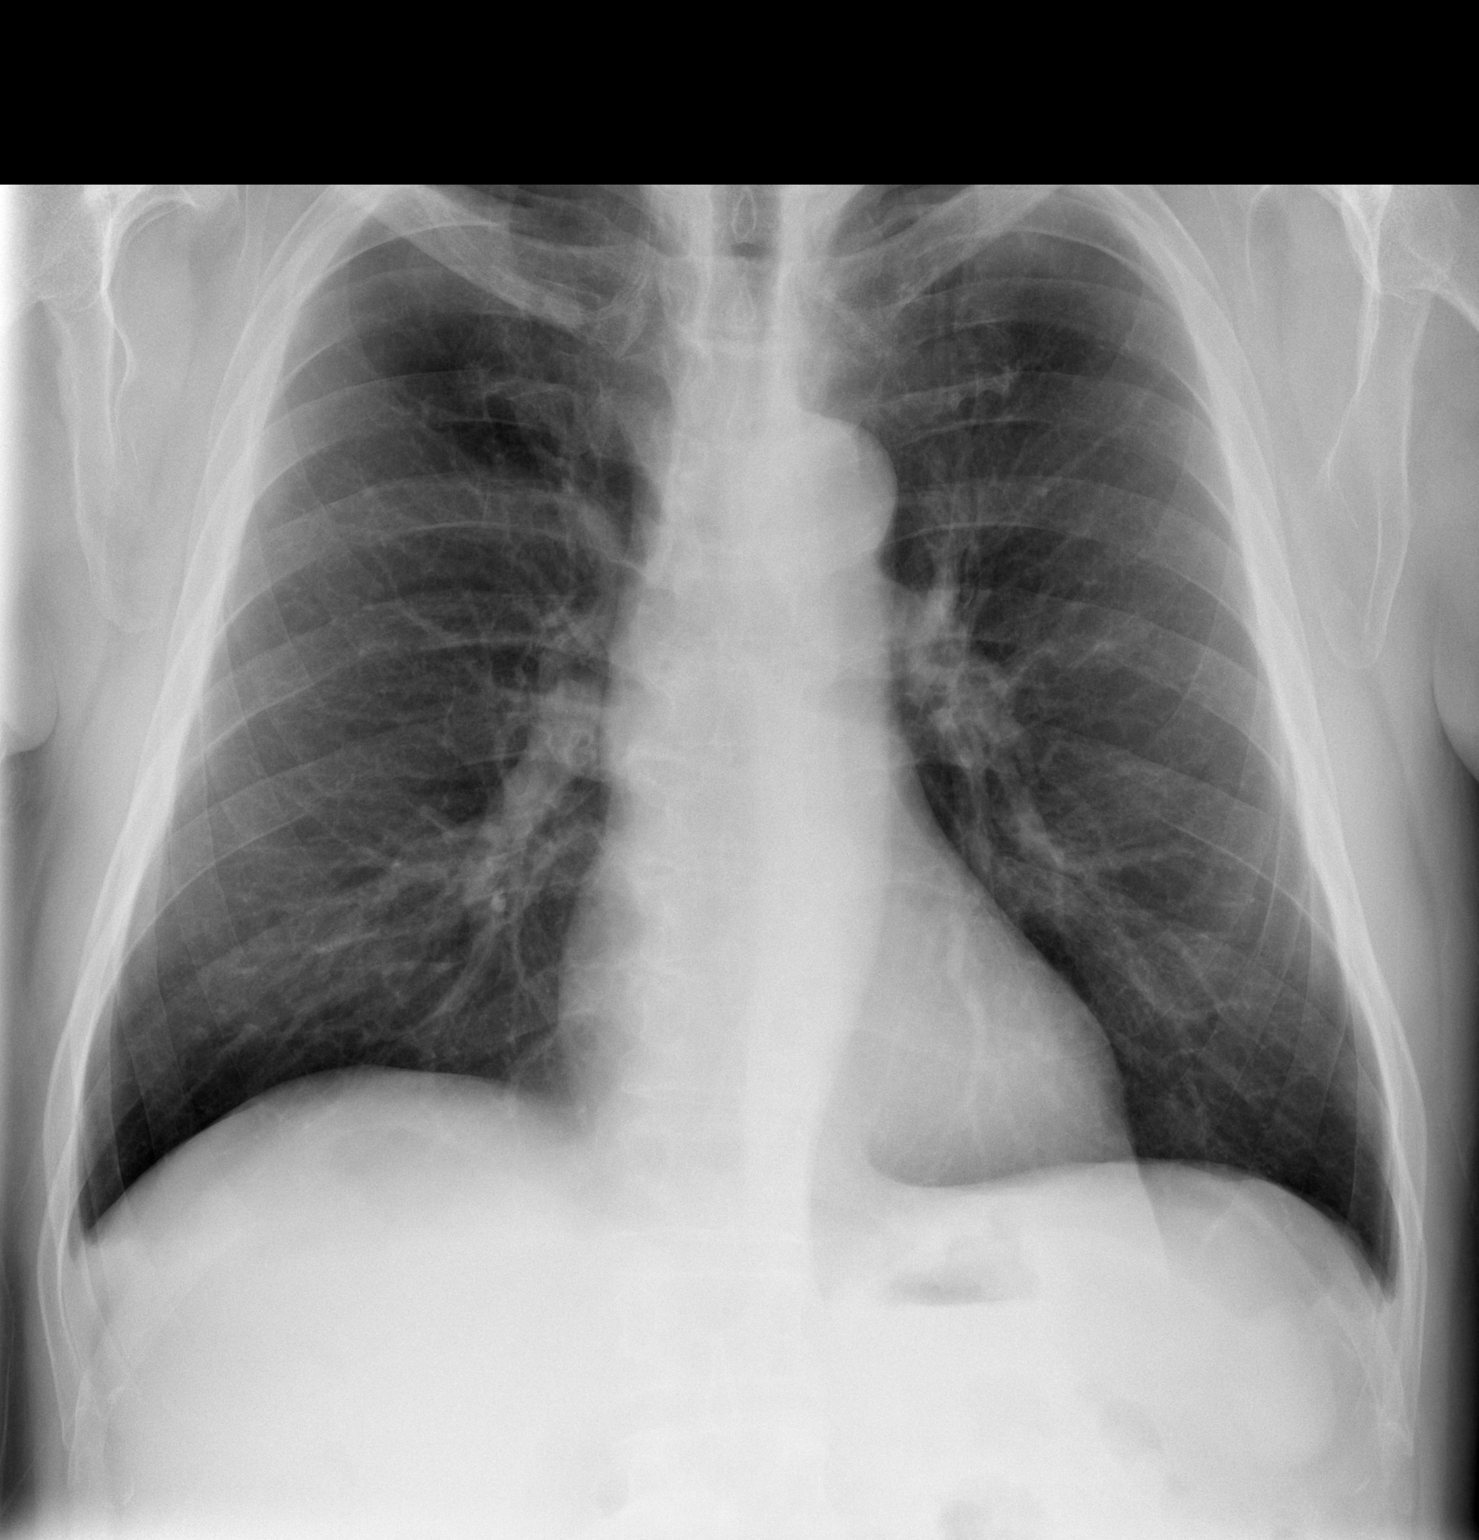

[w chest lat]
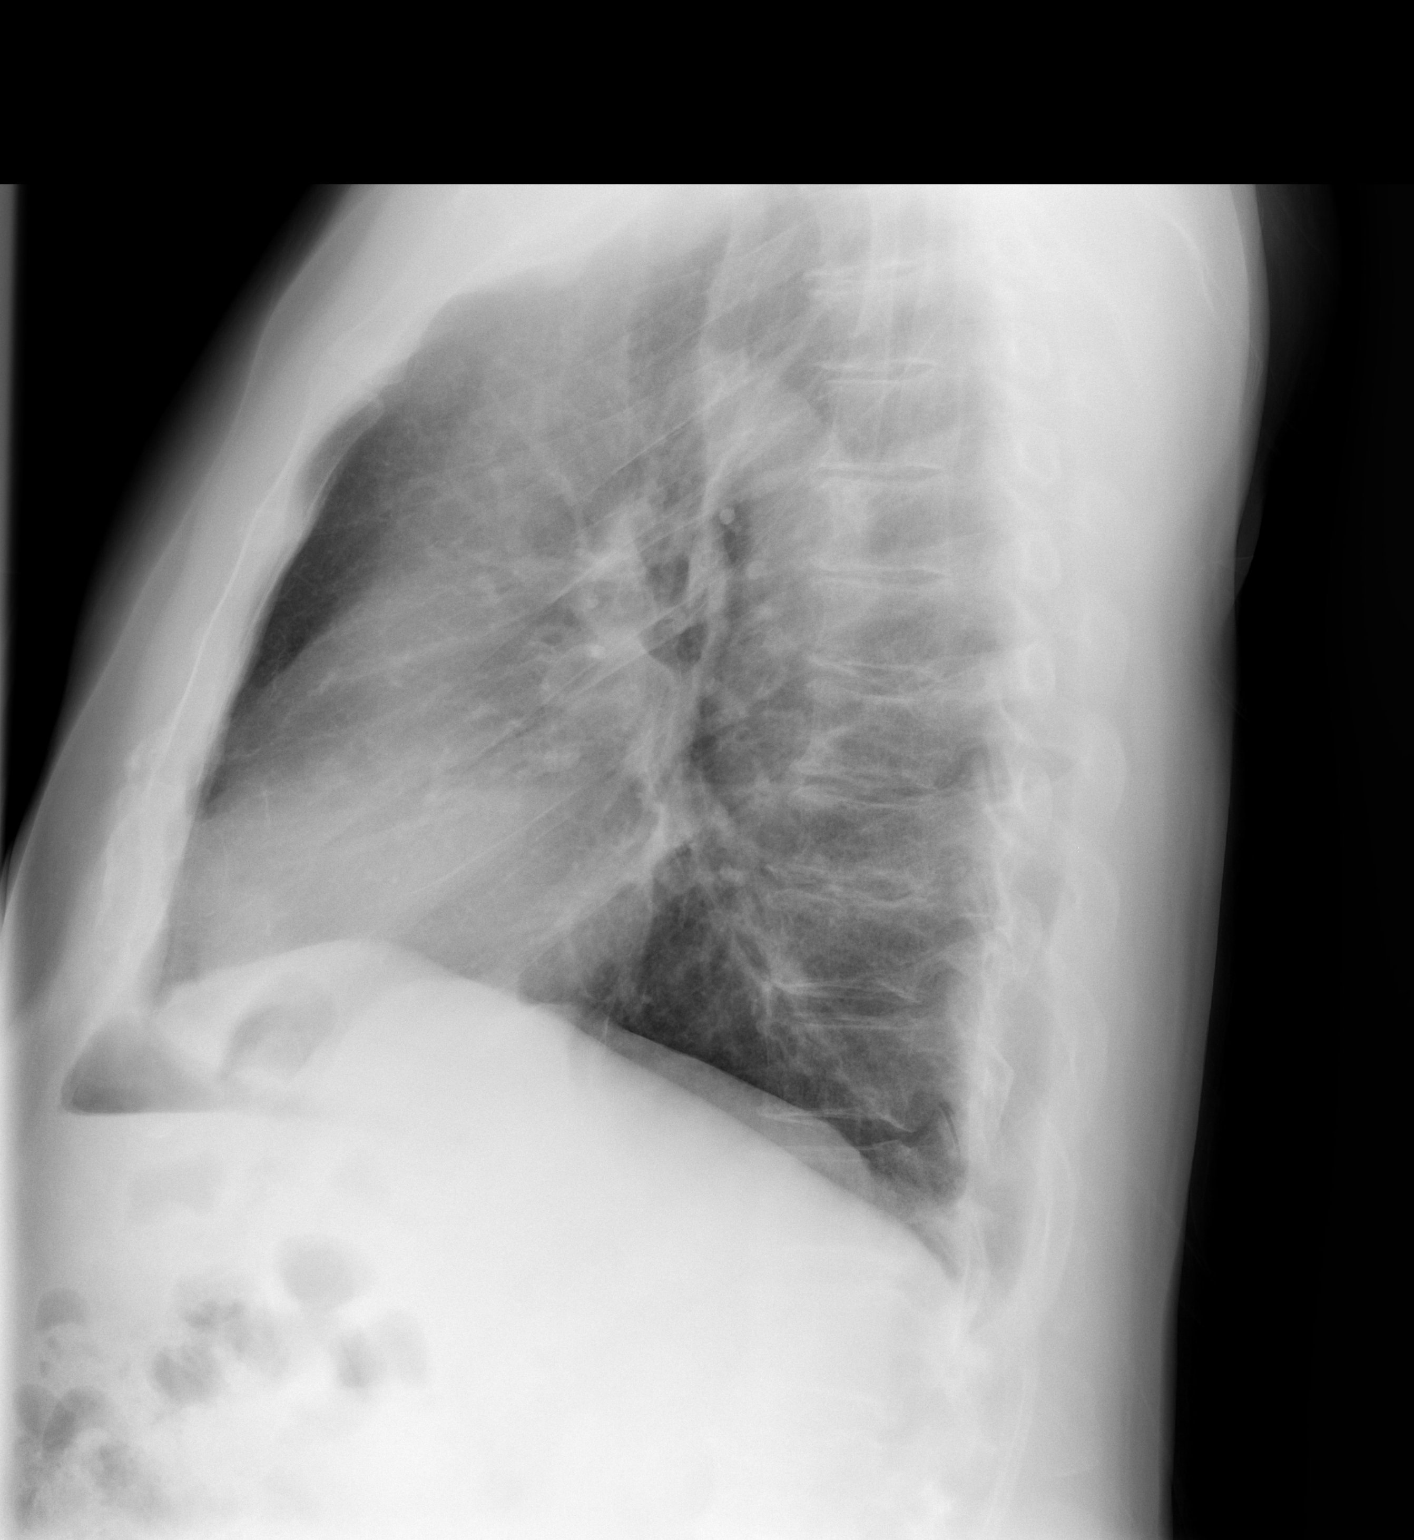

[2 of 2 positions shown; findings below may reference images not displayed]

FINDINGS: The heart size and mediastinal contours are within normal
limits.  Both lungs are clear.  The visualized skeletal structures
are significant for mild thoracic spondylosis.
IMPRESSION: 1.  No acute cardiopulmonary abnormalities.
2.  Mild thoracic spondylosis.

## 2012-08-10 DIAGNOSIS — D239 Other benign neoplasm of skin, unspecified: Secondary | ICD-10-CM | POA: Diagnosis not present

## 2012-08-10 DIAGNOSIS — L570 Actinic keratosis: Secondary | ICD-10-CM | POA: Diagnosis not present

## 2012-08-10 DIAGNOSIS — L821 Other seborrheic keratosis: Secondary | ICD-10-CM | POA: Diagnosis not present

## 2012-08-10 DIAGNOSIS — D2370 Other benign neoplasm of skin of unspecified lower limb, including hip: Secondary | ICD-10-CM | POA: Diagnosis not present

## 2012-08-10 DIAGNOSIS — L989 Disorder of the skin and subcutaneous tissue, unspecified: Secondary | ICD-10-CM | POA: Diagnosis not present

## 2012-08-16 DIAGNOSIS — H18419 Arcus senilis, unspecified eye: Secondary | ICD-10-CM | POA: Diagnosis not present

## 2012-08-26 ENCOUNTER — Other Ambulatory Visit: Payer: Self-pay | Admitting: Internal Medicine

## 2012-08-27 NOTE — Telephone Encounter (Signed)
Refill done.  

## 2012-08-30 ENCOUNTER — Encounter: Payer: Self-pay | Admitting: Internal Medicine

## 2012-08-30 ENCOUNTER — Ambulatory Visit (INDEPENDENT_AMBULATORY_CARE_PROVIDER_SITE_OTHER): Payer: Medicare Other | Admitting: Internal Medicine

## 2012-08-30 VITALS — BP 130/72 | HR 75 | Temp 98.0°F | Wt 222.0 lb

## 2012-08-30 DIAGNOSIS — J310 Chronic rhinitis: Secondary | ICD-10-CM

## 2012-08-30 DIAGNOSIS — I10 Essential (primary) hypertension: Secondary | ICD-10-CM | POA: Diagnosis not present

## 2012-08-30 DIAGNOSIS — M7520 Bicipital tendinitis, unspecified shoulder: Secondary | ICD-10-CM | POA: Diagnosis not present

## 2012-08-30 DIAGNOSIS — M75120 Complete rotator cuff tear or rupture of unspecified shoulder, not specified as traumatic: Secondary | ICD-10-CM | POA: Diagnosis not present

## 2012-08-30 DIAGNOSIS — M25519 Pain in unspecified shoulder: Secondary | ICD-10-CM | POA: Diagnosis not present

## 2012-08-30 LAB — BASIC METABOLIC PANEL
BUN: 16 mg/dL (ref 6–23)
Calcium: 9.4 mg/dL (ref 8.4–10.5)
Creatinine, Ser: 1 mg/dL (ref 0.4–1.5)
GFR: 76.17 mL/min (ref >60.00)
Glucose, Bld: 102 mg/dL — ABNORMAL HIGH (ref 70–99)

## 2012-08-30 MED ORDER — FLUTICASONE PROPIONATE 50 MCG/ACT NA SUSP: 2.0000 | Freq: Every day | NASAL | Status: DC

## 2012-08-30 NOTE — Patient Instructions (Signed)
Avoid AFRIN or similar meds OTC for nasal congestion Neti pot before bedtime follow up by flonase Call if no better

## 2012-08-30 NOTE — Assessment & Plan Note (Signed)
New problem, chronic runny nose at night, apparently using Afrin. Plan: Discontinue Afrin, start Flonase, if not better patient will call, will add Astelin

## 2012-08-30 NOTE — Progress Notes (Signed)
  Subjective:    Patient ID: Thomas Bradley, male    DOB: 1946/05/22, 66 y.o.   MRN: [id number]  HPI Routine office visit Still having problems with right shoulder pain, to see orthopedics today. Also some pain in the left arm from the shoulder down. Has mild chronic neck pain, no upper extremity paresthesias. Also complains of nose congestion, mostly at night, x months, taking the over-the-counter nasal sprays that helped to some extent (Afrin?)  Past Medical History  Diagnosis Date  . Hyperlipemia   . Hypertension   . Benign prostatic hypertrophy     ED, sees urologly every year  . GERD (gastroesophageal reflux disease)   . Prostatitis     2010, again 09-2011  . Erythema multiforme     dx 2013    Past Surgical History  Procedure Date  . Hernia repair 07-2010    open, R inguinal w/ mesh     Review of Systems Medication list reviewed, good compliance. Ambulatory BPs 117/71 on average No fever or chills No sinus pain. Denies any itchy eyes, itchy nose or sneezing.    Objective:   Physical Exam General -- alert, well-developed, and well-nourished.   HEENT -- TMs normal, throat w/o redness, face symmetric and not tender to palpation, nose slt congested Lungs -- normal respiratory effort, no intercostal retractions, no accessory muscle use, and normal breath sounds.   Heart-- normal rate, regular rhythm, no murmur, and no gallop.    Psych-- Cognition and judgment appear intact. Alert and cooperative with normal attention span and concentration.  not anxious appearing and not depressed appearing.       Assessment & Plan:  Declined the flu shot, encouraged to call us when ready

## 2012-08-30 NOTE — Assessment & Plan Note (Addendum)
Well-controlled,  check a BMP 

## 2012-10-07 DIAGNOSIS — N401 Enlarged prostate with lower urinary tract symptoms: Secondary | ICD-10-CM | POA: Diagnosis not present

## 2012-10-07 DIAGNOSIS — N529 Male erectile dysfunction, unspecified: Secondary | ICD-10-CM | POA: Diagnosis not present

## 2012-10-07 DIAGNOSIS — N419 Inflammatory disease of prostate, unspecified: Secondary | ICD-10-CM | POA: Diagnosis not present

## 2012-10-25 DIAGNOSIS — Z23 Encounter for immunization: Secondary | ICD-10-CM | POA: Diagnosis not present

## 2012-12-11 ENCOUNTER — Other Ambulatory Visit: Payer: Self-pay

## 2013-02-23 ENCOUNTER — Other Ambulatory Visit: Payer: Self-pay | Admitting: Internal Medicine

## 2013-02-28 ENCOUNTER — Encounter: Payer: Self-pay | Admitting: Internal Medicine

## 2013-02-28 ENCOUNTER — Ambulatory Visit (INDEPENDENT_AMBULATORY_CARE_PROVIDER_SITE_OTHER): Payer: Medicare Other | Admitting: Internal Medicine

## 2013-02-28 VITALS — BP 132/84 | HR 75 | Temp 97.7°F | Ht 73.0 in | Wt 220.0 lb

## 2013-02-28 DIAGNOSIS — K4090 Unilateral inguinal hernia, without obstruction or gangrene, not specified as recurrent: Secondary | ICD-10-CM

## 2013-02-28 DIAGNOSIS — I10 Essential (primary) hypertension: Secondary | ICD-10-CM

## 2013-02-28 DIAGNOSIS — E785 Hyperlipidemia, unspecified: Secondary | ICD-10-CM | POA: Diagnosis not present

## 2013-02-28 DIAGNOSIS — Z0000 Encounter for general adult medical examination without abnormal findings: Secondary | ICD-10-CM

## 2013-02-28 DIAGNOSIS — D696 Thrombocytopenia, unspecified: Secondary | ICD-10-CM

## 2013-02-28 DIAGNOSIS — M25519 Pain in unspecified shoulder: Secondary | ICD-10-CM

## 2013-02-28 LAB — LIPID PANEL
Cholesterol: 145 mg/dL (ref 0–200)
LDL Cholesterol: 85 mg/dL (ref 0–99)
Triglycerides: 96 mg/dL (ref 0.0–149.0)

## 2013-02-28 LAB — CBC WITH DIFFERENTIAL/PLATELET
Basophils Absolute: 0 10*3/uL (ref 0.0–0.1)
Eosinophils Absolute: 0.1 10*3/uL (ref 0.0–0.7)
Hemoglobin: 15.5 g/dL (ref 13.0–17.0)
Lymphocytes Relative: 20.2 % (ref 12.0–46.0)
Lymphs Abs: 1.4 10*3/uL (ref 0.7–4.0)
MCHC: 33.9 g/dL (ref 30.0–36.0)
Monocytes Absolute: 0.7 10*3/uL (ref 0.1–1.0)
Neutro Abs: 4.8 10*3/uL (ref 1.4–7.7)
RDW: 14.1 % (ref 11.5–14.6)

## 2013-02-28 NOTE — Assessment & Plan Note (Signed)
Sees Dr Althea Charon, reports he had local injections, also has finger tips numbness but no neck pain. rec to see ortho  On motrin, GI precautions discussed

## 2013-02-28 NOTE — Assessment & Plan Note (Addendum)
Td   01-2011 pneumonia shot 2010 shingles shot 07-22-10   colonoscopy 2004,Dr Santogade, colonoscopy again 05-2010, hyperplastic polyps, repeat in 2016 sees urology q year, next visit 09-2013 cont healthy life style rec ASA 81 qd

## 2013-02-28 NOTE — Patient Instructions (Signed)

## 2013-02-28 NOTE — Assessment & Plan Note (Signed)
S/p surgery 2011, ~ 1 year after developed another hernia, also R sided but more distal. Does not like surgical eval, discussed to call if pain-increase size, etc

## 2013-02-28 NOTE — Assessment & Plan Note (Signed)
Good med compliance , labs  

## 2013-02-28 NOTE — Progress Notes (Signed)
Subjective:    Patient ID: Thomas Bradley, male    DOB: 07-Jan-1946, 67 y.o.   MRN: [id number]  HPI  Here for Medicare AWV:  1. Risk factors based on Past M, S, F history: reviewed  2. Physical Activities: Active, heavy yard work  3. Depression/mood: (-) screening  4. Hearing: Some tinnitus, slt decreased R hearing-- at baseline, declined any intervention 5. ADL's: Totally independent  6. Fall Risk: no h/o , low risk  7. home Safety: does feel safe at home  8. Height, weight, &visual acuity: see VS, uses glasses , eyes are checked yearly  9. Counseling: provided  10. Labs ordered based on risk factors: if needed  11. Referral Coordination: if needed  12. Care Plan, see assessment and plan  13. Cognitive Assessment:motor skills and cognition wnl   In addition, today we discussed the following: Hypertension, good medication compliance, ambulatory BPs always within normal. High cholesterol, good medication compliance, no apparent side effects. Had hernia surgery in 2011, since then, another right-sided hernia has surface. Occasional discomfort with heavy lifting.  Past Medical History  Diagnosis Date  . Hyperlipemia   . Hypertension   . Benign prostatic hypertrophy     ED, sees urologly every year  . GERD (gastroesophageal reflux disease)   . Prostatitis     2010, again 09-2011  . Erythema multiforme     dx 2013    Past Surgical History  Procedure Laterality Date  . Hernia repair  07-2010    open, R inguinal w/ mesh   History   Social History  . Marital Status: Married    Spouse Name: N/A    Number of Children: 2  . Years of Education: N/A   Occupational History  . retired     Social History Main Topics  . Smoking status: Former Smoker    Types: Cigarettes    Quit date: 11/13/1976  . Smokeless tobacco: Never Used  . Alcohol Use: No  . Drug Use: No  . Sexually Active: Not on file   Other Topics Concern  . Not on file   Social History Narrative   Has 3  acres, works in the yard a lot x 6 hours sometimes, feels well.     4 G-kids        Family History  Problem Relation Age of Onset  . Coronary artery disease Father 66  . Diabetes      brothers  . Hypertension Sister   . Stroke Neg Hx   . Colon cancer Neg Hx   . Prostate cancer Neg Hx      Review of Systems  Denies chest pain or shortness or breath.  No  nausea, vomiting, diarrhea blood in the stools. Occasionally has bilateral shoulder pain for which he takes ibuprofen, at times daily,  always with food. Denies epigastric burning. Sometimes have numbness at the fingertips bilaterally, he denies neck pain ,no  gait disturbances or  lower extremity symptoms       Objective:   Physical Exam General -- alert, well-developed, No apparent distress.   Neck --no thyromegaly , normal carotid pulse Lungs -- normal respiratory effort, no intercostal retractions, no accessory muscle use, and normal breath sounds.   Heart-- normal rate, regular rhythm, no murmur, and no gallop.   Abdomen--soft, non-tender, no distention, no masses, no HSM, no guarding, and no rigidity.  Reducible hernia at R inguinal area, not tender  Extremities-- no pretibial edema bilaterally Neurologic-- alert & oriented X3  and strength normal in all extremities. Psych-- Cognition and judgment appear intact. Alert and cooperative with normal attention span and concentration.  not anxious appearing and not depressed appearing.       Assessment & Plan:

## 2013-02-28 NOTE — Assessment & Plan Note (Signed)
Seems well controlled, last BMP wnl

## 2013-02-28 NOTE — Assessment & Plan Note (Signed)
Monitor issue w/ a CBC

## 2013-07-20 DIAGNOSIS — Z23 Encounter for immunization: Secondary | ICD-10-CM | POA: Diagnosis not present

## 2013-08-22 DIAGNOSIS — H269 Unspecified cataract: Secondary | ICD-10-CM | POA: Diagnosis not present

## 2013-08-22 DIAGNOSIS — D3130 Benign neoplasm of unspecified choroid: Secondary | ICD-10-CM | POA: Diagnosis not present

## 2013-08-26 DIAGNOSIS — L570 Actinic keratosis: Secondary | ICD-10-CM | POA: Diagnosis not present

## 2013-08-26 DIAGNOSIS — L821 Other seborrheic keratosis: Secondary | ICD-10-CM | POA: Diagnosis not present

## 2013-08-26 DIAGNOSIS — D239 Other benign neoplasm of skin, unspecified: Secondary | ICD-10-CM | POA: Diagnosis not present

## 2013-08-28 ENCOUNTER — Other Ambulatory Visit: Payer: Self-pay | Admitting: Internal Medicine

## 2013-08-29 NOTE — Telephone Encounter (Signed)
rx refilled per protocol. DJR  

## 2013-09-01 ENCOUNTER — Other Ambulatory Visit: Payer: Self-pay

## 2013-10-11 DIAGNOSIS — N401 Enlarged prostate with lower urinary tract symptoms: Secondary | ICD-10-CM | POA: Diagnosis not present

## 2013-10-11 DIAGNOSIS — N529 Male erectile dysfunction, unspecified: Secondary | ICD-10-CM | POA: Diagnosis not present

## 2013-10-11 DIAGNOSIS — N139 Obstructive and reflux uropathy, unspecified: Secondary | ICD-10-CM | POA: Diagnosis not present

## 2013-10-31 ENCOUNTER — Ambulatory Visit (INDEPENDENT_AMBULATORY_CARE_PROVIDER_SITE_OTHER): Payer: Medicare Other | Admitting: Internal Medicine

## 2013-10-31 ENCOUNTER — Encounter: Payer: Self-pay | Admitting: Internal Medicine

## 2013-10-31 VITALS — BP 130/80 | HR 83 | Temp 98.0°F | Wt 222.6 lb

## 2013-10-31 DIAGNOSIS — E785 Hyperlipidemia, unspecified: Secondary | ICD-10-CM

## 2013-10-31 DIAGNOSIS — I10 Essential (primary) hypertension: Secondary | ICD-10-CM | POA: Diagnosis not present

## 2013-10-31 LAB — BASIC METABOLIC PANEL
BUN: 18 mg/dL (ref 6–23)
CO2: 28 meq/L (ref 19–32)
Calcium: 9.2 mg/dL (ref 8.4–10.5)
Chloride: 104 mEq/L (ref 96–112)
Creatinine, Ser: 1.2 mg/dL (ref 0.4–1.5)
GFR: 66.18 mL/min (ref >60.00)
GLUCOSE: 102 mg/dL — AB (ref 70–99)
POTASSIUM: 4 meq/L (ref 3.5–5.1)
SODIUM: 139 meq/L (ref 135–145)

## 2013-10-31 NOTE — Patient Instructions (Signed)
Get your blood work before you leave  Next visit for a physical exam   fasting, in 5 months  Please make an appointment

## 2013-10-31 NOTE — Progress Notes (Signed)
   Subjective:    Patient ID: Thomas Bradley, male    DOB: [birth year], 68 y.o.   MRN: [id number]  HPI ROV Today we discussed the following issues: Hypertension--good medication compliance, ambulatory BPs 123/72. High cholesterol--on Pravachol, take it on and off because sometimes develop aches and pains and he think it may be from medication.   Past Medical History  Diagnosis Date  . Hyperlipemia   . Hypertension   . Benign prostatic hypertrophy     ED, sees urologly every year  . GERD (gastroesophageal reflux disease)   . Prostatitis     2010, again 09-2011  . Erythema multiforme     dx 2013    Past Surgical History  Procedure Laterality Date  . Hernia repair  07-2010    open, R inguinal w/ mesh     Review of Systems Denies chest pain or shortness or breath No nausea, vomiting, diarrhea. Had a flu shot. BPH symptoms well-controlled    Objective:   Physical Exam BP 130/80  Pulse 83  Temp(Src) 98 F (36.7 C)  Wt 222 lb 9.6 oz (100.971 kg)  SpO2 97% General -- alert, well-developed, NAD.  HEENT-- Not pale.  Lungs -- normal respiratory effort, no intercostal retractions, no accessory muscle use, and normal breath sounds.  Heart-- normal rate, regular rhythm, no murmur.  Extremities-- no pretibial edema bilaterally  Neurologic--  alert & oriented X3. Speech normal, gait normal, strength normal in all extremities.  Psych-- Cognition and judgment appear intact. Cooperative with normal attention span and concentration. No anxious or depressed appearing.      Assessment & Plan:

## 2013-10-31 NOTE — Progress Notes (Signed)
Pre visit review using our clinic review tool, if applicable. No additional management support is needed unless otherwise documented below in the visit note. 

## 2013-10-31 NOTE — Assessment & Plan Note (Signed)
Well-controlled, no change, check a BMP 

## 2013-10-31 NOTE — Assessment & Plan Note (Signed)
Reports that he has not taken pravachol routinely, thinks it may be causing aches and pains. Recommend to take Pravachol daily, then hold for 2 weeks and see if there is any relationship with pain. Patient to let me know. Diet and exercise encourage. Also reports a previous intolerance to Lipitor

## 2013-12-26 ENCOUNTER — Other Ambulatory Visit: Payer: Self-pay | Admitting: Internal Medicine

## 2014-04-26 ENCOUNTER — Telehealth: Payer: Self-pay | Admitting: *Deleted

## 2014-04-26 NOTE — Telephone Encounter (Signed)
Medication List and allergies: Reviewed and updated   Local prescriptions: [facility] [address]  Immunizations Due:  Prevnar  A/P FH, PSH, or Personal VZ:CHYIFOYD and updated Flu vaccine: 07/2013 Tdap: 01/2011 PNA: 12/2008 (pna 23) Shingles: 06/2010 CCS: 05/2010 Polyps--> 5 years PSA:  03/2010 2.98  To Discuss with Provider: Not at this time.

## 2014-04-27 ENCOUNTER — Ambulatory Visit (INDEPENDENT_AMBULATORY_CARE_PROVIDER_SITE_OTHER): Payer: Medicare Other | Admitting: Internal Medicine

## 2014-04-27 ENCOUNTER — Encounter: Payer: Self-pay | Admitting: Internal Medicine

## 2014-04-27 VITALS — BP 122/76 | HR 80 | Temp 98.0°F | Ht 73.0 in | Wt 219.0 lb

## 2014-04-27 DIAGNOSIS — E785 Hyperlipidemia, unspecified: Secondary | ICD-10-CM | POA: Diagnosis not present

## 2014-04-27 DIAGNOSIS — N400 Enlarged prostate without lower urinary tract symptoms: Secondary | ICD-10-CM | POA: Diagnosis not present

## 2014-04-27 DIAGNOSIS — Z0000 Encounter for general adult medical examination without abnormal findings: Secondary | ICD-10-CM | POA: Diagnosis not present

## 2014-04-27 DIAGNOSIS — Z23 Encounter for immunization: Secondary | ICD-10-CM

## 2014-04-27 DIAGNOSIS — I10 Essential (primary) hypertension: Secondary | ICD-10-CM

## 2014-04-27 LAB — LIPID PANEL
CHOL/HDL RATIO: 4
Cholesterol: 159 mg/dL (ref 0–200)
HDL: 43.2 mg/dL (ref >39.00)
LDL Cholesterol: 98 mg/dL (ref 0–99)
NonHDL: 115.8
TRIGLYCERIDES: 87 mg/dL (ref 0.0–149.0)
VLDL: 17.4 mg/dL (ref 0.0–40.0)

## 2014-04-27 LAB — BASIC METABOLIC PANEL
BUN: 23 mg/dL (ref 6–23)
CHLORIDE: 106 meq/L (ref 96–112)
CO2: 27 mEq/L (ref 19–32)
CREATININE: 1.2 mg/dL (ref 0.4–1.5)
Calcium: 9.3 mg/dL (ref 8.4–10.5)
GFR: 63.55 mL/min (ref >60.00)
Glucose, Bld: 123 mg/dL — ABNORMAL HIGH (ref 70–99)
POTASSIUM: 4.3 meq/L (ref 3.5–5.1)
Sodium: 140 mEq/L (ref 135–145)

## 2014-04-27 LAB — AST: AST: 21 U/L (ref 0–37)

## 2014-04-27 LAB — ALT: ALT: 19 U/L (ref 0–53)

## 2014-04-27 LAB — TSH: TSH: 1.2 u[IU]/mL (ref 0.35–4.50)

## 2014-04-27 NOTE — Progress Notes (Signed)
Pre visit review using our clinic review tool, if applicable. No additional management support is needed unless otherwise documented below in the visit note. 

## 2014-04-27 NOTE — Assessment & Plan Note (Addendum)
Td   01-2011 pneumonia shot 2010 prevnar-- today shingles shot 07-22-10   colonoscopy 2004,Dr Santogade, colonoscopy again 05-2010, hyperplastic polyps, repeat in 2016 last urology q  visit 09-2013, told to f/u prn. cont healthy life style rec ASA 81 qd

## 2014-04-27 NOTE — Progress Notes (Signed)
Subjective:    Patient ID: Thomas Bradley, male    DOB: [birth year], 68 y.o.   MRN: [id number]  DOS:  04/27/2014 Type of  Visit:   Here for Medicare AWV:  1. Risk factors based on Past M, S, F history: reviewed  2. Physical Activities: Active, yard work   3. Depression/mood: Neg screening  4. Hearing: Some tinnitus, slt decreased R hearing,   was offered hearing aids before ( declined ), stable  5. ADL's: Totally independent  6. Fall Risk: no h/o , low risk  7. home Safety: does feel safe at home  8. Height, weight, &visual acuity: see VS, uses glasses , eyes are checked yearly  9. Counseling: provided  10. Labs ordered based on risk factors: if needed  11. Referral Coordination: if needed  12. Care Plan, see assessment and plan  13. Cognitive Assessment:motor skills and cognition wnl , above average for age   In addition, today we discussed the following: High cholesterol, good medication compliance without apparent side effects Hypertension, good compliance with medicines, ambulatory BPs on average 112/71 BPH, symptoms well-controlled Complain of occasional pain at the left shoulder with some radiation to the trapezoid and arm. No paresthesias or motor deficits. 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   ROS No  CP, SOB Denies  nausea, vomiting diarrhea, blood in the stools No abdominal pain (-) cough, sputum production (-) wheezing, chest congestion No dysuria, gross hematuria, difficulty urinating  No headaches Denies dizziness    Past Medical History  Diagnosis Date  . Hyperlipemia   . Hypertension   . Benign prostatic hypertrophy     ED, sees urologly every year  . GERD (gastroesophageal reflux disease)   . Prostatitis     2010, again 09-2011  . Erythema multiforme     dx 2013     Past Surgical History  Procedure Laterality Date  . Hernia repair  07-2010    open, R inguinal w/ mesh     History   Social History  . Marital Status: Married    Spouse Name: N/A    Number of Children: 2  . Years of Education: N/A   Occupational History  . retired     Social History Main Topics  . Smoking status: Former Smoker    Types: Cigarettes    Quit date: 11/13/1976  . Smokeless tobacco: Never Used  . Alcohol Use: No  . Drug Use: No  . Sexual Activity: Not on file   Other Topics Concern  . Not on file   Social History Narrative   Has 3 acres, works in the yard a lot x 6 hours sometimes, feels well.     4 G-kids          Family History  Problem Relation Age of Onset  . Coronary artery disease Father 7  . Diabetes Other     brothers  . Hypertension Sister   . Stroke Neg Hx   . Colon cancer Neg Hx   . Prostate cancer Neg Hx        Medication List       This list is accurate as of: 04/27/14  2:38 PM.  Always use your most recent med list.               amLODipine 10 MG tablet  Commonly known as:  NORVASC  take 1 tablet by  mouth once daily     aspirin 81 MG tablet  Take 81 mg by mouth daily.     CALCIUM 500 + D PO  Take 1 tablet by mouth daily.     doxazosin 8 MG tablet  Commonly known as:  CARDURA  take 1 tablet by mouth at bedtime     EPINEPHrine 0.3 mg/0.3 mL Devi  Commonly known as:  EPIPEN  Inject 0.3 mLs (0.3 mg total) into the muscle once.     Fish Oil 1000 MG Caps  Take 1 capsule by mouth 2 (two) times daily.     fluticasone 50 MCG/ACT nasal spray  Commonly known as:  FLONASE  Place 2 sprays into the nose daily.     multivitamin capsule  Take 1 capsule by mouth daily.     pravastatin 40 MG tablet  Commonly known as:  PRAVACHOL  take 1 tablet by mouth at bedtime     vitamin B-12 250 MCG tablet  Commonly known as:  CYANOCOBALAMIN  Take 250 mcg by mouth daily.     Vitamin D3 2000 UNITS Tabs  Take 1 tablet by mouth daily.           Objective:   Physical Exam BP 122/76  Pulse 80  Temp(Src) 98 F (36.7 C)  Ht 6\' 1"   (1.854 m)  Wt 219 lb (99.338 kg)  BMI 28.90 kg/m2  SpO2 97%  General -- alert, well-developed, NAD.  Neck --no thyromegaly , normal carotid pulse  HEENT-- Not pale. Lungs -- normal respiratory effort, no intercostal retractions, no accessory muscle use, and normal breath sounds.  Heart-- normal rate, regular rhythm, no murmur.  Abdomen-- Not distended, good bowel sounds,soft, non-tender. No bruit, mass . Extremities-- no pretibial edema bilaterally  Neurologic--  alert & oriented X3. Speech normal, gait appropriate for age, strength symmetric and appropriate for age.  Psych-- Cognition and judgment appear intact. Cooperative with normal attention span and concentration. No anxious or depressed appearing.       Assessment & Plan:

## 2014-04-27 NOTE — Assessment & Plan Note (Signed)
Good compliance with medication,labs

## 2014-04-27 NOTE — Patient Instructions (Signed)
Get your blood work before you leave   Next visit is for a physical exam in 1 year,  fasting Please make an appointment     At some point this year the clinic will relocate to  [place],  [address] (10 minutes form here)  [place], [place] [address] [phone]   Fall Prevention and Boyd cause injuries and can affect all age groups. It is possible to use preventive measures to significantly decrease the likelihood of falls. There are many simple measures which can make your home safer and prevent falls. OUTDOORS  Repair cracks and edges of walkways and driveways.  Remove high doorway thresholds.  Trim shrubbery on the main path into your home.  Have good outside lighting.  Clear walkways of tools, rocks, debris, and clutter.  Check that handrails are not broken and are securely fastened. Both sides of steps should have handrails.  Have leaves, snow, and ice cleared regularly.  Use sand or salt on walkways during winter months.  In the garage, clean up grease or oil spills. BATHROOM  Install night lights.  Install grab bars by the toilet and in the tub and shower.  Use non-skid mats or decals in the tub or shower.  Place a plastic non-slip stool in the shower to sit on, if needed.  Keep floors dry and clean up all water on the floor immediately.  Remove soap buildup in the tub or shower on a regular basis.  Secure bath mats with non-slip, double-sided rug tape.  Remove throw rugs and tripping hazards from the floors. BEDROOMS  Install night lights.  Make sure a bedside light is easy to reach.  Do not use oversized bedding.  Keep a telephone by your bedside.  Have a firm chair with side arms to use for getting dressed.  Remove throw rugs and tripping hazards from the floor. KITCHEN  Keep handles on pots and pans turned toward the center of the stove. Use back burners when  possible.  Clean up spills quickly and allow time for drying.  Avoid walking on wet floors.  Avoid hot utensils and knives.  Position shelves so they are not too high or low.  Place commonly used objects within easy reach.  If necessary, use a sturdy step stool with a grab bar when reaching.  Keep electrical cables out of the way.  Do not use floor polish or wax that makes floors slippery. If you must use wax, use non-skid floor wax.  Remove throw rugs and tripping hazards from the floor. STAIRWAYS  Never leave objects on stairs.  Place handrails on both sides of stairways and use them. Fix any loose handrails. Make sure handrails on both sides of the stairways are as long as the stairs.  Check carpeting to make sure it is firmly attached along stairs. Make repairs to worn or loose carpet promptly.  Avoid placing throw rugs at the top or bottom of stairways, or properly secure the rug with carpet tape to prevent slippage. Get rid of throw rugs, if possible.  Have an electrician put in a light switch at the top and bottom of the stairs. OTHER FALL PREVENTION TIPS  Wear low-heel or rubber-soled shoes that are supportive and fit well. Wear closed toe shoes.  When using a stepladder, make sure it is fully opened and both spreaders are firmly locked. Do not climb a closed stepladder.  Add color or contrast paint or tape to grab bars and handrails in your home. Place contrasting color strips on first and last steps.  Learn and use mobility aids as needed. Install an electrical emergency response system.  Turn on lights to avoid dark areas. Replace light bulbs that burn out immediately. Get light switches that glow.  Arrange furniture to create clear pathways. Keep furniture in the same place.  Firmly attach carpet with non-skid or double-sided tape.  Eliminate uneven floor surfaces.  Select a carpet pattern that does not visually hide the edge of steps.  Be aware of all  pets. OTHER HOME SAFETY TIPS  Set the water temperature for 120 F (48.8 C).  Keep emergency numbers on or near the telephone.  Keep smoke detectors on every level of the home and near sleeping areas. Document Released: 10/03/2002 Document Revised: 04/13/2012 Document Reviewed: 01/02/2012 [facility] Patient Information 2015 [place], [place]. This information is not intended to replace advice given to you by your health care provider. Make sure you discuss any questions you have with your health care provider.

## 2014-04-27 NOTE — Assessment & Plan Note (Addendum)
Chart and pertinent labs reviewed  Blood pressure well-controlled, good compliance w/ medication, will check a BMP

## 2014-04-27 NOTE — Assessment & Plan Note (Signed)
Last visit with urology 09/2013, was recommended to go back to them as needed, plan to check a DRE/ PSA next year

## 2014-05-01 NOTE — Progress Notes (Signed)
Add on request form for A1c faxed to [facility] lab

## 2014-05-02 ENCOUNTER — Other Ambulatory Visit (INDEPENDENT_AMBULATORY_CARE_PROVIDER_SITE_OTHER): Payer: Medicare Other

## 2014-05-02 ENCOUNTER — Telehealth: Payer: Self-pay | Admitting: *Deleted

## 2014-05-02 DIAGNOSIS — R7309 Other abnormal glucose: Secondary | ICD-10-CM | POA: Diagnosis not present

## 2014-05-02 DIAGNOSIS — R739 Hyperglycemia, unspecified: Secondary | ICD-10-CM

## 2014-05-02 LAB — HEMOGLOBIN A1C: Hgb A1c MFr Bld: 6.2 % (ref 4.6–6.5)

## 2014-05-02 NOTE — Telephone Encounter (Signed)
Spoke with patients wife and advised that pt will need additional blood work. Appt scheduled for 10:30 this morning. Orders placed

## 2014-05-02 NOTE — Addendum Note (Signed)
Addended by: Modena Morrow D on: 05/02/2014 02:33 PM   Modules accepted: Orders

## 2014-05-02 NOTE — Telephone Encounter (Signed)
Message copied by Chilton Greathouse on Tue May 02, 2014  9:21 AM ------      Message from: Thomas Bradley      Created: Tue May 02, 2014  8:36 AM       Advise patient,      His cholesterol is under excellent control. All other labs normal except for the blood sugar which is   slightly elevated. Please arrange for a A1c DX hyperglycemia ------

## 2014-05-03 ENCOUNTER — Encounter: Payer: Self-pay | Admitting: *Deleted

## 2014-06-15 ENCOUNTER — Ambulatory Visit (INDEPENDENT_AMBULATORY_CARE_PROVIDER_SITE_OTHER): Payer: Medicare Other | Admitting: Medical

## 2014-06-15 ENCOUNTER — Encounter: Payer: Self-pay | Admitting: Medical

## 2014-06-15 ENCOUNTER — Telehealth: Payer: Self-pay | Admitting: Internal Medicine

## 2014-06-15 VITALS — BP 127/70 | HR 77 | Temp 98.4°F | Wt 221.4 lb

## 2014-06-15 DIAGNOSIS — L0390 Cellulitis, unspecified: Secondary | ICD-10-CM | POA: Insufficient documentation

## 2014-06-15 DIAGNOSIS — L03115 Cellulitis of right lower limb: Secondary | ICD-10-CM

## 2014-06-15 DIAGNOSIS — L02419 Cutaneous abscess of limb, unspecified: Secondary | ICD-10-CM

## 2014-06-15 DIAGNOSIS — L03119 Cellulitis of unspecified part of limb: Secondary | ICD-10-CM

## 2014-06-15 MED ORDER — DOXYCYCLINE HYCLATE 100 MG PO TABS: 100.0000 mg | ORAL_TABLET | Freq: Two times a day (BID) | ORAL | Status: DC

## 2014-06-15 NOTE — Telephone Encounter (Signed)
Caller name: Jan Relation to pt: other  Call back number: [phone] Pharmacy: [facility]   Reason for call:  doxycycline (VIBRA-TABS) 100 MG tablet co pay is to high please change medication.

## 2014-06-15 NOTE — Progress Notes (Signed)
   Subjective:    Patient ID: Thomas Bradley, male    DOB: 17-May-1946, 68 y.o.   MRN: [id number]  HPI  Pt had laceration 5 days ago. Scraped against piece of wood. He was helping build a shed. Pt did not get evaluated. Pt had tdap in 2012. Edges of wound sting on palpation. When are scrapes against sheet it hurts. No fever, no chills. Some yellow creamy discharge. But now dry in center.    Review of Systems  Constitutional: Negative for fever, chills and fatigue.  Respiratory: Negative for cough, choking and wheezing.   Cardiovascular: Negative for chest pain and palpitations.  Musculoskeletal:       Lateral calf mild sore at times near laceration.  Skin:       Redness around laceration. Wound 60 days old.  Neurological: Negative for weakness.  Hematological: Negative for adenopathy. Does not bruise/bleed easily.       Objective:   Physical Exam General- No acute distress, pleasant. Rt lower ext- lateral aspect just below calf has 3cm long laceration about 3 mm wide. Surrounded by margin of redness about 1/2 inch. Area mild indurated and  tender.Wound is healing by secondary intention. Already filled in some. Lymphatic- no palpable lymph node, no palpable chord or tracking.       Assessment & Plan:

## 2014-06-15 NOTE — Progress Notes (Signed)
Pre-visit discussion using our clinic review tool. No additional management support is needed unless otherwise documented below in the visit note.  

## 2014-06-15 NOTE — Patient Instructions (Signed)
You do have infection of the laceration that is healing by secondary intension. I did send out culture today. Will send antibiotic to your pharmacy. The area should gradually get better. If area worsens then notify us and return. If severe worsening with fever, chills, expanding redness then UC or ED evaluation on weekend or after hours. Follow up in 7 days or as needed. We will call you with culture results.

## 2014-06-15 NOTE — Assessment & Plan Note (Signed)
Infected laceration line. 70 days old. Healing secondary intention Tdap in 2012. Culure taken from dried tissue in healing wound. Follow up in 7 days or as needed.

## 2014-06-16 MED ORDER — SULFAMETHOXAZOLE-TMP DS 800-160 MG PO TABS: 1.0000 | ORAL_TABLET | Freq: Two times a day (BID) | ORAL | Status: DC

## 2014-06-16 NOTE — Telephone Encounter (Signed)
Edward sent new Rx to pharmacy

## 2014-06-18 LAB — WOUND CULTURE
GRAM STAIN: NONE SEEN
GRAM STAIN: NONE SEEN
Gram Stain: NONE SEEN
Organism ID, Bacteria: NO GROWTH

## 2014-06-20 ENCOUNTER — Ambulatory Visit (INDEPENDENT_AMBULATORY_CARE_PROVIDER_SITE_OTHER): Payer: Medicare Other | Admitting: Internal Medicine

## 2014-06-20 ENCOUNTER — Encounter: Payer: Self-pay | Admitting: Internal Medicine

## 2014-06-20 VITALS — BP 124/68 | HR 66 | Temp 98.3°F | Wt 218.5 lb

## 2014-06-20 DIAGNOSIS — L03119 Cellulitis of unspecified part of limb: Secondary | ICD-10-CM

## 2014-06-20 DIAGNOSIS — L03115 Cellulitis of right lower limb: Secondary | ICD-10-CM

## 2014-06-20 DIAGNOSIS — L02419 Cutaneous abscess of limb, unspecified: Secondary | ICD-10-CM

## 2014-06-20 NOTE — Progress Notes (Signed)
Subjective:    Patient ID: Thomas Bradley, male    DOB: 1946/06/21, 68 y.o.   MRN: [id number]  DOS:  06/20/2014 Type of visit - description: f/u History: Was seen 06/15/2014 with cellulitis, prescribed Bactrim for 10 days, area has improved with decreased redness, pain. Denies fever or chills, no side effects from the antibiotics       Past Medical History  Diagnosis Date  . Hyperlipemia   . Hypertension   . Benign prostatic hypertrophy     ED, sees urologly every year  . GERD (gastroesophageal reflux disease)   . Prostatitis     2010, again 09-2011  . Erythema multiforme     dx 2013     Past Surgical History  Procedure Laterality Date  . Hernia repair  07-2010    open, R inguinal w/ mesh    History   Social History  . Marital Status: Married    Spouse Name: N/A    Number of Children: 2  . Years of Education: N/A   Occupational History  . retired     Social History Main Topics  . Smoking status: Former Smoker    Types: Cigarettes    Quit date: 11/13/1976  . Smokeless tobacco: Never Used  . Alcohol Use: No  . Drug Use: No  . Sexual Activity: Not on file   Other Topics Concern  . Not on file   Social History Narrative   Has 3 acres, works in the yard a lot x 6 hours sometimes, feels well.     4 G-kids             Medication List       This list is accurate as of: 06/20/14 11:59 PM.  Always use your most recent med list.               amLODipine 10 MG tablet  Commonly known as:  NORVASC  take 1 tablet by mouth once daily     aspirin 81 MG tablet  Take 81 mg by mouth daily.     CALCIUM 500 + D PO  Take 1 tablet by mouth daily.     doxazosin 8 MG tablet  Commonly known as:  CARDURA  take 1 tablet by mouth at bedtime     doxycycline 100 MG tablet  Commonly known as:  VIBRA-TABS  Take 1 tablet (100 mg total) by mouth 2 (two) times daily.     EPINEPHrine 0.3 mg/0.3 mL Devi  Commonly known as:  EPIPEN  Inject 0.3 mLs (0.3 mg total)  into the muscle once.     multivitamin capsule  Take 1 capsule by mouth daily.     pravastatin 40 MG tablet  Commonly known as:  PRAVACHOL  take 1 tablet by mouth at bedtime     sulfamethoxazole-trimethoprim 800-160 MG per tablet  Commonly known as:  BACTRIM DS  Take 1 tablet by mouth 2 (two) times daily.     Vitamin D3 2000 UNITS Tabs  Take 1 tablet by mouth daily.           Objective:   Physical Exam  Constitutional: He appears well-developed and well-nourished.  Musculoskeletal:       Legs: Psychiatric: He has a normal mood and affect. His behavior is normal. Judgment and thought content normal.   BP 124/68  Pulse 66  Temp(Src) 98.3 F (36.8 C) (Oral)  Wt 218 lb 8 oz (99.111 kg)  SpO2 97%  v  Assessment & Plan:   Cellulitis improving, recommend to continue with antibiotics and call if there gradual improvement stops or if the area gets worse

## 2014-06-20 NOTE — Progress Notes (Signed)
Pre-visit discussion using our clinic review tool. No additional management support is needed unless otherwise documented below in the visit note.  

## 2014-06-27 ENCOUNTER — Other Ambulatory Visit: Payer: Self-pay | Admitting: Internal Medicine

## 2014-08-04 DIAGNOSIS — Z23 Encounter for immunization: Secondary | ICD-10-CM | POA: Diagnosis not present

## 2014-09-11 DIAGNOSIS — L821 Other seborrheic keratosis: Secondary | ICD-10-CM | POA: Diagnosis not present

## 2014-09-11 DIAGNOSIS — L814 Other melanin hyperpigmentation: Secondary | ICD-10-CM | POA: Diagnosis not present

## 2014-09-11 DIAGNOSIS — D239 Other benign neoplasm of skin, unspecified: Secondary | ICD-10-CM | POA: Diagnosis not present

## 2014-09-11 DIAGNOSIS — D2371 Other benign neoplasm of skin of right lower limb, including hip: Secondary | ICD-10-CM | POA: Diagnosis not present

## 2014-09-27 DIAGNOSIS — M7072 Other bursitis of hip, left hip: Secondary | ICD-10-CM | POA: Diagnosis not present

## 2014-09-27 DIAGNOSIS — M75102 Unspecified rotator cuff tear or rupture of left shoulder, not specified as traumatic: Secondary | ICD-10-CM | POA: Diagnosis not present

## 2014-11-28 DIAGNOSIS — R351 Nocturia: Secondary | ICD-10-CM | POA: Diagnosis not present

## 2014-11-28 DIAGNOSIS — N5201 Erectile dysfunction due to arterial insufficiency: Secondary | ICD-10-CM | POA: Diagnosis not present

## 2014-11-28 DIAGNOSIS — N401 Enlarged prostate with lower urinary tract symptoms: Secondary | ICD-10-CM | POA: Diagnosis not present

## 2015-01-02 ENCOUNTER — Other Ambulatory Visit: Payer: Self-pay | Admitting: Internal Medicine

## 2015-01-31 ENCOUNTER — Other Ambulatory Visit: Payer: Self-pay

## 2015-04-09 ENCOUNTER — Telehealth: Payer: Self-pay | Admitting: Internal Medicine

## 2015-04-09 NOTE — Telephone Encounter (Signed)
Pre Visit letter sent  °

## 2015-04-27 ENCOUNTER — Encounter: Payer: Self-pay | Admitting: Behavioral Health

## 2015-04-27 ENCOUNTER — Telehealth: Payer: Self-pay | Admitting: Behavioral Health

## 2015-04-27 NOTE — Telephone Encounter (Signed)
Pre-Visit Call completed with patient and chart updated.   Pre-Visit Info documented in Specialty Comments under [employer].    

## 2015-05-01 ENCOUNTER — Ambulatory Visit (INDEPENDENT_AMBULATORY_CARE_PROVIDER_SITE_OTHER): Payer: Medicare Other | Admitting: Internal Medicine

## 2015-05-01 ENCOUNTER — Encounter: Payer: Self-pay | Admitting: Internal Medicine

## 2015-05-01 VITALS — BP 120/78 | HR 73 | Temp 97.8°F | Ht 72.0 in | Wt 226.0 lb

## 2015-05-01 DIAGNOSIS — M150 Primary generalized (osteo)arthritis: Secondary | ICD-10-CM

## 2015-05-01 DIAGNOSIS — Z0000 Encounter for general adult medical examination without abnormal findings: Secondary | ICD-10-CM | POA: Diagnosis not present

## 2015-05-01 DIAGNOSIS — N400 Enlarged prostate without lower urinary tract symptoms: Secondary | ICD-10-CM | POA: Diagnosis not present

## 2015-05-01 DIAGNOSIS — R7303 Prediabetes: Secondary | ICD-10-CM

## 2015-05-01 DIAGNOSIS — I10 Essential (primary) hypertension: Secondary | ICD-10-CM | POA: Diagnosis not present

## 2015-05-01 DIAGNOSIS — M159 Polyosteoarthritis, unspecified: Secondary | ICD-10-CM

## 2015-05-01 DIAGNOSIS — R739 Hyperglycemia, unspecified: Secondary | ICD-10-CM | POA: Insufficient documentation

## 2015-05-01 DIAGNOSIS — E785 Hyperlipidemia, unspecified: Secondary | ICD-10-CM

## 2015-05-01 DIAGNOSIS — R7309 Other abnormal glucose: Secondary | ICD-10-CM | POA: Diagnosis not present

## 2015-05-01 DIAGNOSIS — E119 Type 2 diabetes mellitus without complications: Secondary | ICD-10-CM | POA: Insufficient documentation

## 2015-05-01 LAB — COMPREHENSIVE METABOLIC PANEL
ALBUMIN: 4.2 g/dL (ref 3.5–5.2)
ALT: 14 U/L (ref 0–53)
AST: 14 U/L (ref 0–37)
Alkaline Phosphatase: 93 U/L (ref 39–117)
BUN: 19 mg/dL (ref 6–23)
CALCIUM: 9.5 mg/dL (ref 8.4–10.5)
CHLORIDE: 106 meq/L (ref 96–112)
CO2: 24 mEq/L (ref 19–32)
Creatinine, Ser: 1.05 mg/dL (ref 0.40–1.50)
GFR: 73.93 mL/min (ref >60.00)
Glucose, Bld: 118 mg/dL — ABNORMAL HIGH (ref 70–99)
POTASSIUM: 4.3 meq/L (ref 3.5–5.1)
SODIUM: 140 meq/L (ref 135–145)
Total Bilirubin: 0.5 mg/dL (ref 0.2–1.2)
Total Protein: 7 g/dL (ref 6.0–8.3)

## 2015-05-01 LAB — LIPID PANEL
CHOL/HDL RATIO: 4
CHOLESTEROL: 165 mg/dL (ref 0–200)
HDL: 39 mg/dL — AB (ref >39.00)
LDL Cholesterol: 105 mg/dL — ABNORMAL HIGH (ref 0–99)
NONHDL: 126
TRIGLYCERIDES: 103 mg/dL (ref 0.0–149.0)
VLDL: 20.6 mg/dL (ref 0.0–40.0)

## 2015-05-01 LAB — CBC WITH DIFFERENTIAL/PLATELET
Basophils Absolute: 0 10*3/uL (ref 0.0–0.1)
Basophils Relative: 0.5 % (ref 0.0–3.0)
EOS ABS: 0.2 10*3/uL (ref 0.0–0.7)
EOS PCT: 3.9 % (ref 0.0–5.0)
HEMATOCRIT: 44 % (ref 39.0–52.0)
HEMOGLOBIN: 14.8 g/dL (ref 13.0–17.0)
LYMPHS ABS: 1.3 10*3/uL (ref 0.7–4.0)
Lymphocytes Relative: 25.8 % (ref 12.0–46.0)
MCHC: 33.5 g/dL (ref 30.0–36.0)
MCV: 89.1 fl (ref 78.0–100.0)
MONO ABS: 0.5 10*3/uL (ref 0.1–1.0)
Monocytes Relative: 10.7 % (ref 3.0–12.0)
Neutro Abs: 3 10*3/uL (ref 1.4–7.7)
Neutrophils Relative %: 59.1 % (ref 43.0–77.0)
Platelets: 184 10*3/uL (ref 150.0–400.0)
RBC: 4.94 Mil/uL (ref 4.22–5.81)
RDW: 15.3 % (ref 11.5–15.5)
WBC: 5.1 10*3/uL (ref 4.0–10.5)

## 2015-05-01 LAB — HEMOGLOBIN A1C: HEMOGLOBIN A1C: 6.3 % (ref 4.6–6.5)

## 2015-05-01 LAB — PSA: PSA: 4.59 ng/mL — AB (ref 0.10–4.00)

## 2015-05-01 MED ORDER — PRAVASTATIN SODIUM 40 MG PO TABS: 40.0000 mg | ORAL_TABLET | Freq: Every day | ORAL | Status: DC

## 2015-05-01 MED ORDER — DOXAZOSIN MESYLATE 8 MG PO TABS: 8.0000 mg | ORAL_TABLET | Freq: Every day | ORAL | Status: DC

## 2015-05-01 MED ORDER — AMLODIPINE BESYLATE 10 MG PO TABS: 10.0000 mg | ORAL_TABLET | Freq: Every day | ORAL | Status: DC

## 2015-05-01 NOTE — Progress Notes (Signed)
Subjective:    Patient ID: Thomas Bradley, male    DOB: 1946-10-24, 69 y.o.   MRN: [id number]  DOS:  05/01/2015 Type of visit - description :    Here for Medicare AWV:   1. Risk factors based on Past M, S, F history: reviewed   2. Physical Activities: Active, yard work    3. Depression/mood: Neg screening   4. Hearing: Some tinnitus, slt decreased R hearing,  stable   5. ADL's: Totally independent   6. Fall Risk: no h/o , low risk , prevention discussed 7. home Safety: does feel safe at home   8. Height, weight, &visual acuity: see VS, uses glasses , eyes are checked yearly   9. Counseling: provided   10. Labs ordered based on risk factors: if needed   11. Referral Coordination: if needed   12. Care Plan, see assessment and plan  . Written plan provided 13. Cognitive Assessment:motor skills and cognition wnl , above average for age  49. Care team updated  15. End of life care discussed   In addition, today we discussed the following: HTN, good compliance of medication, ambulatory BPs 120/70 High cholesterol, on Pravachol, no apparent side effects DJD, continue with shoulder pain, worse on the right. Has also developed knee pain for the last 3 months, right side. BPH, chart reviewed, due for a DRE and PSA  Review of Systems Constitutional: No fever. No chills. No unexplained wt changes. No unusual sweats  HEENT: to have a  dental extraction tomorrow , no ear discharge, no facial swelling, no voice changes. No eye discharge, no eye  redness , no  intolerance to light   Respiratory: No wheezing , no  difficulty breathing. No cough , no mucus production  Cardiovascular: No CP, no leg swelling , no  Palpitations  GI: no nausea, no vomiting, no diarrhea , no  abdominal pain.  No blood in the stools. No dysphagia, no odynophagia    Endocrine: No polyphagia, no polyuria , no polydipsia  GU: No dysuria, gross hematuria, difficulty urinating. No urinary urgency, no  frequency.  Musculoskeletal:  continue with pain, see history of present illness   Skin: No change in the color of the skin, palor , no  Rash  Allergic, immunologic: No environmental allergies , no  food allergies  Neurological: No dizziness no  syncope. No headaches. No diplopia, no slurred, no slurred speech, no motor deficits, no facial  Numbness  Hematological: No enlarged lymph nodes, no easy bruising , no unusual bleedings  Psychiatry: No suicidal ideas, no hallucinations, no beavior problems, no confusion.  No unusual/severe anxiety, no depression    Past Medical History  Diagnosis Date  . Hyperlipemia   . Hypertension   . Benign prostatic hypertrophy     ED, sees urologly every year  . GERD (gastroesophageal reflux disease)   . Prostatitis     2010, again 09-2011  . Erythema multiforme     dx 2013     Past Surgical History  Procedure Laterality Date  . Hernia repair  07-2010    open, R inguinal w/ mesh    History   Social History  . Marital Status: Married    Spouse Name: N/A  . Number of Children: 2  . Years of Education: N/A   Occupational History  . retired     Social History Main Topics  . Smoking status: Former Smoker    Types: Cigarettes    Quit date: 11/13/1976  .  Smokeless tobacco: Never Used  . Alcohol Use: No  . Drug Use: No  . Sexual Activity: Not on file   Other Topics Concern  . Not on file   Social History Narrative   Has 3 acres, works in the yard a lot x 6 hours sometimes, feels well.     4 G-kids          Family History  Problem Relation Age of Onset  . Coronary artery disease Father 92  . Diabetes Other     brothers  . Hypertension Sister   . Stroke Neg Hx   . Colon cancer Neg Hx   . Prostate cancer Neg Hx       Medication List       This list is accurate as of: 05/01/15 12:59 PM.  Always use your most recent med list.               amLODipine 10 MG tablet  Commonly known as:  NORVASC  Take 1 tablet (10 mg  total) by mouth daily.     aspirin 81 MG tablet  Take 81 mg by mouth daily.     CALCIUM 500 + D PO  Take 1 tablet by mouth daily.     doxazosin 8 MG tablet  Commonly known as:  CARDURA  Take 1 tablet (8 mg total) by mouth at bedtime.     EPINEPHrine 0.3 mg/0.3 mL Devi  Commonly known as:  EPIPEN  Inject 0.3 mLs (0.3 mg total) into the muscle once.     multivitamin capsule  Take 1 capsule by mouth daily.     pravastatin 40 MG tablet  Commonly known as:  PRAVACHOL  Take 1 tablet (40 mg total) by mouth at bedtime.     Vitamin D3 2000 UNITS Tabs  Take 1 tablet by mouth daily.           Objective:   Physical Exam BP 120/78 mmHg  Pulse 73  Temp(Src) 97.8 F (36.6 C) (Oral)  Ht 6' (1.829 m)  Wt 226 lb (102.513 kg)  BMI 30.64 kg/m2  SpO2 95%  General:   Well developed, well nourished . NAD.  Neck:  Full range of motion. Supple. No  thyromegaly , normal carotid pulse HEENT:  Normocephalic . Face symmetric, atraumatic Lungs:  CTA B Normal respiratory effort, no intercostal retractions, no accessory muscle use. Heart: RRR,  no murmur.  No pretibial edema bilaterally  Abdomen:  Not distended, soft, non-tender. No rebound or rigidity. No mass,organomegaly Rectal:  External abnormalities: none. Normal sphincter tone. No rectal masses or tenderness.  No stools found  Prostate: Right side slightly larger and more firm compared to the left. No nodules, tenderness. Skin: Exposed areas without rash. Not pale. Not jaundice Neurologic:  alert & oriented X3.  Speech normal, gait appropriate for age and unassisted Strength symmetric and appropriate for age.  Psych: Cognition and judgment appear intact.  Cooperative with normal attention span and concentration.  Behavior appropriate. No anxious or depressed appearing.       Assessment & Plan:

## 2015-05-01 NOTE — Assessment & Plan Note (Addendum)
Back in 2012  he had prostatitis, urine culture was positive for Escherichia coli, PSA went from 5.2 to 2.9. Used to see urology regularly. Currently asymptomatic but  DRE is abnormal with a right-sided induration Plan: Check PSA, refer to urology. Addendum: States he saw urology 10-2014, will get records

## 2015-05-01 NOTE — Progress Notes (Signed)
Pre visit review using our clinic review tool, if applicable. No additional management support is needed unless otherwise documented below in the visit note. 

## 2015-05-01 NOTE — Assessment & Plan Note (Signed)
A1c last year was 6.2, patient aware of the diagnosis, recommend a healthy lifestyle

## 2015-05-01 NOTE — Assessment & Plan Note (Addendum)
Continue with shoulder pain worse on the right and now with a three-month history of knee pain. Recommend to see orthopedics

## 2015-05-01 NOTE — Assessment & Plan Note (Signed)
On Pravachol, check labs

## 2015-05-01 NOTE — Assessment & Plan Note (Addendum)
Td   01-2011 pneumonia shot 2010 prevnar-- 2015 shingles shot 07-22-10   colonoscopy 2004,Dr Santogade, colonoscopy again 05-2010 -Dr Michail Sermon-, hyperplastic polyps, repeat in 2016: refer to GI  cont healthy life style rec ASA 81 qd

## 2015-05-01 NOTE — Patient Instructions (Signed)
Get your blood work before you leave    [place] cause injuries and can affect all age groups. It is possible to use preventive measures to significantly decrease the likelihood of falls. There are many simple measures which can make your home safer and prevent falls. OUTDOORS  Repair cracks and edges of walkways and driveways.  Remove high doorway thresholds.  Trim shrubbery on the main path into your home.  Have good outside lighting.  Clear walkways of tools, rocks, debris, and clutter.  Check that handrails are not broken and are securely fastened. Both sides of steps should have handrails.  Have leaves, snow, and ice cleared regularly.  Use sand or salt on walkways during winter months.  In the garage, clean up grease or oil spills. BATHROOM  Install night lights.  Install grab bars by the toilet and in the tub and shower.  Use non-skid mats or decals in the tub or shower.  Place a plastic non-slip stool in the shower to sit on, if needed.  Keep floors dry and clean up all water on the floor immediately.  Remove soap buildup in the tub or shower on a regular basis.  Secure bath mats with non-slip, double-sided rug tape.  Remove throw rugs and tripping hazards from the floors. BEDROOMS  Install night lights.  Make sure a bedside light is easy to reach.  Do not use oversized bedding.  Keep a telephone by your bedside.  Have a firm chair with side arms to use for getting dressed.  Remove throw rugs and tripping hazards from the floor. KITCHEN  Keep handles on pots and pans turned toward the center of the stove. Use back burners when possible.  Clean up spills quickly and allow time for drying.  Avoid walking on wet floors.  Avoid hot utensils and knives.  Position shelves so they are not too high or low.  Place commonly used objects within easy reach.  If necessary, use a sturdy step stool with a grab bar when  reaching.  Keep electrical cables out of the way.  Do not use floor polish or wax that makes floors slippery. If you must use wax, use non-skid floor wax.  Remove throw rugs and tripping hazards from the floor. STAIRWAYS  Never leave objects on stairs.  Place handrails on both sides of stairways and use them. Fix any loose handrails. Make sure handrails on both sides of the stairways are as long as the stairs.  Check carpeting to make sure it is firmly attached along stairs. Make repairs to worn or loose carpet promptly.  Avoid placing throw rugs at the top or bottom of stairways, or properly secure the rug with carpet tape to prevent slippage. Get rid of throw rugs, if possible.  Have an electrician put in a light switch at the top and bottom of the stairs. OTHER FALL PREVENTION TIPS  Wear low-heel or rubber-soled shoes that are supportive and fit well. Wear closed toe shoes.  When using a stepladder, make sure it is fully opened and both spreaders are firmly locked. Do not climb a closed stepladder.  Add color or contrast paint or tape to grab bars and handrails in your home. Place contrasting color strips on first and last steps.  Learn and use mobility aids as needed. Install an electrical emergency response system.  Turn on lights to avoid dark areas. Replace light bulbs that burn out immediately. Get light switches that glow.  Arrange furniture  to create clear pathways. Keep furniture in the same place.  Firmly attach carpet with non-skid or double-sided tape.  Eliminate uneven floor surfaces.  Select a carpet pattern that does not visually hide the edge of steps.  Be aware of all pets. OTHER HOME SAFETY TIPS  Set the water temperature for 120 F (48.8 C).  Keep emergency numbers on or near the telephone.  Keep smoke detectors on every level of the home and near sleeping areas. Document Released: 10/03/2002 Document Revised: 04/13/2012 Document Reviewed:  01/02/2012 [facility] Patient Information 2015 [place], [place]. This information is not intended to replace advice given to you by your health care provider. Make sure you discuss any questions you have with your health care provider.   Preventive Care for Adults Ages 25 and over  Blood pressure check.** / Every 1 to 2 years.  Lipid and cholesterol check.**/ Every 5 years beginning at age 68.  Lung cancer screening. / Every year if you are aged 66-80 years and have a 30-pack-year history of smoking and currently smoke or have quit within the past 15 years. Yearly screening is stopped once you have quit smoking for at least 15 years or develop a health problem that would prevent you from having lung cancer treatment.  Fecal occult blood test (FOBT) of stool. / Every year beginning at age 79 and continuing until age 31. You may not have to do this test if you get a colonoscopy every 10 years.  Flexible sigmoidoscopy** or colonoscopy.** / Every 5 years for a flexible sigmoidoscopy or every 10 years for a colonoscopy beginning at age 60 and continuing until age 68.  Hepatitis C blood test.** / For all people born from [birth year] through 1965 and any individual with known risks for hepatitis C.  Abdominal aortic aneurysm (AAA) screening.** / A one-time screening for ages 35 to 30 years who are current or former smokers.  Skin self-exam. / Monthly.  Influenza vaccine. / Every year.  Tetanus, diphtheria, and acellular pertussis (Tdap/Td) vaccine.** / 1 dose of Td every 10 years.  Varicella vaccine.** / Consult your health care provider.  Zoster vaccine.** / 1 dose for adults aged 26 years or older.  Pneumococcal 13-valent conjugate (PCV13) vaccine.** / Consult your health care provider.  Pneumococcal polysaccharide (PPSV23) vaccine.** / 1 dose for all adults aged 14 years and older.  Meningococcal vaccine.** / Consult your health care provider.  Hepatitis A vaccine.** / Consult your health care  provider.  Hepatitis B vaccine.** / Consult your health care provider.  Haemophilus influenzae type b (Hib) vaccine.** / Consult your health care provider. **Family history and personal history of risk and conditions may change your health care provider's recommendations. Document Released: 12/09/2001 Document Revised: 10/18/2013 Document Reviewed: 03/10/2011 [facility] Patient Information 2015 [place], [place]. This information is not intended to replace advice given to you by your health care provider. Make sure you discuss any questions you have with your health care provider. '

## 2015-05-01 NOTE — Assessment & Plan Note (Signed)
Well-controlled, refill meds, check a BMP and CBC

## 2015-05-04 ENCOUNTER — Telehealth: Payer: Self-pay | Admitting: Internal Medicine

## 2015-05-04 DIAGNOSIS — R3989 Other symptoms and signs involving the genitourinary system: Secondary | ICD-10-CM

## 2015-05-04 DIAGNOSIS — R972 Elevated prostate specific antigen [PSA]: Secondary | ICD-10-CM

## 2015-05-04 NOTE — Telephone Encounter (Signed)
Spoke with Pt, informed him of elevated PSA results. Informed him that Dr. Larose Kells would like for him to see Dr. Risa Grill due to abnormal DRE. I informed that Dr. Larose Kells does not believe its urgent but would still like for him to see Dr. Risa Grill. Pt verbalized understanding. Referral placed to Dr. Risa Grill (non-urgent).

## 2015-05-04 NOTE — Telephone Encounter (Signed)
Notes from urology reviewed, he was seen 11/28/2014, at the time prostate exam was normal. No recent PSA. Please arrange a urology referral (no urgent) DX abnormal DRE during my last office visit 05/01/2015.  Also PSAs elevated. When you send the referral, send it along with my last office visit note and  PSAs. Please notify the patient of above

## 2015-05-25 DIAGNOSIS — N5201 Erectile dysfunction due to arterial insufficiency: Secondary | ICD-10-CM | POA: Diagnosis not present

## 2015-05-25 DIAGNOSIS — R972 Elevated prostate specific antigen [PSA]: Secondary | ICD-10-CM | POA: Diagnosis not present

## 2015-05-25 DIAGNOSIS — N138 Other obstructive and reflux uropathy: Secondary | ICD-10-CM | POA: Diagnosis not present

## 2015-05-25 DIAGNOSIS — N401 Enlarged prostate with lower urinary tract symptoms: Secondary | ICD-10-CM | POA: Diagnosis not present

## 2015-07-04 DIAGNOSIS — Z8371 Family history of colonic polyps: Secondary | ICD-10-CM | POA: Diagnosis not present

## 2015-07-04 DIAGNOSIS — K640 First degree hemorrhoids: Secondary | ICD-10-CM | POA: Diagnosis not present

## 2015-07-04 DIAGNOSIS — Z1211 Encounter for screening for malignant neoplasm of colon: Secondary | ICD-10-CM | POA: Diagnosis not present

## 2015-07-04 LAB — HM COLONOSCOPY

## 2015-07-25 ENCOUNTER — Ambulatory Visit (INDEPENDENT_AMBULATORY_CARE_PROVIDER_SITE_OTHER): Payer: Medicare Other

## 2015-07-25 ENCOUNTER — Ambulatory Visit (INDEPENDENT_AMBULATORY_CARE_PROVIDER_SITE_OTHER): Payer: Medicare Other | Admitting: Physician Assistant

## 2015-07-25 VITALS — BP 120/66 | HR 86 | Temp 98.3°F | Resp 16 | Ht 72.0 in | Wt 225.2 lb

## 2015-07-25 DIAGNOSIS — S6992XA Unspecified injury of left wrist, hand and finger(s), initial encounter: Secondary | ICD-10-CM

## 2015-07-25 DIAGNOSIS — S61219A Laceration without foreign body of unspecified finger without damage to nail, initial encounter: Secondary | ICD-10-CM

## 2015-07-25 DIAGNOSIS — Z23 Encounter for immunization: Secondary | ICD-10-CM | POA: Diagnosis not present

## 2015-07-25 DIAGNOSIS — M79645 Pain in left finger(s): Secondary | ICD-10-CM

## 2015-07-25 MED ORDER — DOXYCYCLINE HYCLATE 100 MG PO CAPS: 100.0000 mg | ORAL_CAPSULE | Freq: Two times a day (BID) | ORAL | Status: DC

## 2015-07-25 MED ORDER — HYDROCODONE-ACETAMINOPHEN 5-325 MG PO TABS: 1.0000 | ORAL_TABLET | Freq: Four times a day (QID) | ORAL | Status: DC | PRN

## 2015-07-25 NOTE — Progress Notes (Signed)
   Subjective:    Patient ID: Thomas Bradley, male    DOB: Aug 09, 1946, 69 y.o.   MRN: [id number]  HPI Patient presents for left hand index finger laceration following accident a hour ago where table saw kicked back wood and his hand slipped. Immediately wrapped finger and did not clean. Finger was very painful, but now is beginning to feel numb. NKDA.   Review of Systems  Constitutional: Negative for fever.  Skin: Positive for wound. Negative for color change.  Neurological: Positive for numbness.       Objective:   Physical Exam  Constitutional: He is oriented to person, place, and time. He appears well-developed and well-nourished. No distress.  Blood pressure 120/66, pulse 86, temperature 98.3 F (36.8 C), temperature source Oral, resp. rate 16, height 6' (1.829 m), weight 225 lb 3.2 oz (102.15 kg), SpO2 96 %.  HENT:  Head: Normocephalic and atraumatic.  Right Ear: External ear normal.  Left Ear: External ear normal.  Eyes: Conjunctivae are normal. Right eye exhibits no discharge. Left eye exhibits no discharge. No scleral icterus.  Pulmonary/Chest: Effort normal.  Musculoskeletal: He exhibits tenderness (of left index finger).  Neurological: He is alert and oriented to person, place, and time. No sensory deficit.  Skin: Skin is warm and dry. No rash noted. He is not diaphoretic. No erythema. No pallor.  Psychiatric: He has a normal mood and affect. His behavior is normal. Judgment and thought content normal.   Procedure Consent given. 5cc 1% lido digital block. Cleaned with soap and water. Sterile field. Explored would. 5-0 ethilon sutures placed. Clean dressing placed.   PA read; comment please: No acute bony abnormalities.     Assessment & Plan:  1. Finger laceration, initial encounter 2. Pain of finger of left hand 3. Finger injury, left, initial encounter RTC for suture removal 08/02/15. Care instructions and warning signs discussed. - DG Finger Index Left; Future -  Tdap vaccine greater than or equal to 7yo IM - doxycycline (VIBRAMYCIN) 100 MG capsule; Take 1 capsule (100 mg total) by mouth 2 (two) times daily.  Dispense: 20 capsule; Refill: 0 - HYDROcodone-acetaminophen (NORCO) 5-325 MG tablet; Take 1 tablet by mouth every 6 (six) hours as needed.  Dispense: 30 tablet; Refill: 0   Tishira Brewington PA-C  [facility] 07/31/2015 11:03 AM

## 2015-07-31 ENCOUNTER — Encounter: Payer: Self-pay | Admitting: Physician Assistant

## 2015-08-02 ENCOUNTER — Ambulatory Visit (INDEPENDENT_AMBULATORY_CARE_PROVIDER_SITE_OTHER): Payer: Medicare Other | Admitting: Physician Assistant

## 2015-08-02 ENCOUNTER — Telehealth: Payer: Self-pay

## 2015-08-02 VITALS — BP 108/68 | HR 85 | Temp 98.5°F | Resp 16 | Ht 72.0 in | Wt 222.0 lb

## 2015-08-02 DIAGNOSIS — L089 Local infection of the skin and subcutaneous tissue, unspecified: Secondary | ICD-10-CM

## 2015-08-02 DIAGNOSIS — Z4802 Encounter for removal of sutures: Secondary | ICD-10-CM

## 2015-08-02 DIAGNOSIS — Z23 Encounter for immunization: Secondary | ICD-10-CM

## 2015-08-02 DIAGNOSIS — S61211D Laceration without foreign body of left index finger without damage to nail, subsequent encounter: Secondary | ICD-10-CM | POA: Diagnosis not present

## 2015-08-02 MED ORDER — AMOXICILLIN-POT CLAVULANATE 875-125 MG PO TABS: 1.0000 | ORAL_TABLET | Freq: Two times a day (BID) | ORAL | Status: DC

## 2015-08-02 MED ORDER — CEFTRIAXONE SODIUM 1 G IJ SOLR
1.0000 g | Freq: Once | INTRAMUSCULAR | Status: AC
  Administered 2015-08-02: 1 g via INTRAMUSCULAR

## 2015-08-02 NOTE — Patient Instructions (Signed)
Stop the Doxycycline and start Taking Augmentin today.  Return to clinic tomorrow if your symptoms are not improving.

## 2015-08-02 NOTE — Progress Notes (Signed)
   08/02/2015 at 3:54 PM  Thomas Bradley / DOB: [birth year] / MRN: [id number]  The patient has Dyslipidemia; Essential hypertension; GERD; INGUINAL HERNIA, RIGHT; BPH (benign prostatic hyperplasia); Annual physical exam; DJD (degenerative joint disease); Rhinitis; and Prediabetes on his problem list.  SUBJECTIVE  Thomas Bradley is a 69 y.o. well appearing male presenting for the chief complaint of left index finger pain s/p laceration repair nine days ago.  Reports finger swelling and exquisite tenderness of the tip of the digit that started 3 days previous.  He has been taking doxy bid for nine days now.     He  has a past medical history of Hyperlipemia; Hypertension; Benign prostatic hypertrophy; GERD (gastroesophageal reflux disease); Prostatitis; Erythema multiforme; Nocturia; Erectile dysfunction due to arterial insufficiency; Elevated PSA; and BPH (benign prostatic hypertrophy) with urinary obstruction.    Medications reviewed and updated by myself where necessary, and exist elsewhere in the encounter.   Thomas Bradley has No Known Allergies. He  reports that he quit smoking about 38 years ago. His smoking use included Cigarettes. He has never used smokeless tobacco. He reports that he does not drink alcohol or use illicit drugs. He  has no sexual activity history on file. The patient  has past surgical history that includes Hernia repair (07-2010).  His family history includes Coronary artery disease (age of onset: 8) in his father; Diabetes in his other; Hypertension in his sister. There is no history of Stroke, Colon cancer, or Prostate cancer.  Review of Systems  Constitutional: Negative for fever.  Cardiovascular: Negative for chest pain.  Musculoskeletal: Positive for joint pain.    OBJECTIVE  His  height is 6' (1.829 m) and weight is 222 lb (100.699 kg). His oral temperature is 98.5 F (36.9 C). His blood pressure is 108/68 and his pulse is 85. His respiration is 16.  The patient's body  mass index is 30.1 kg/(m^2).  Physical Exam  Vitals reviewed. Constitutional: He is oriented to person, place, and time. He appears well-developed.  Eyes: EOM are normal. Pupils are equal, round, and reactive to light.  Cardiovascular: Normal rate.   Respiratory: Effort normal.  Musculoskeletal:       Hands: Neurological: He is alert and oriented to person, place, and time. No cranial nerve deficit.  Skin: He is not diaphoretic.   Procedure:  Patient anesthetized via digital block using 4 cc total of 1:1 mix of 1% lidocaine without epi and Marcaine. Pressure used to evacuate wound of pus.  The laceration remains unhealed.    No results found for this or any previous visit (from the past 24 hour(s)).  ASSESSMENT & PLAN  Thomas Bradley was seen today for hand pain and flu vaccine.  Diagnoses and all orders for this visit:  Finger infection -     cefTRIAXone (ROCEPHIN) injection 1 g; Inject 1 g into the muscle once. -     Wound culture -     amoxicillin-clavulanate (AUGMENTIN) 875-125 MG tablet; Take 1 tablet by mouth 2 (two) times daily.  Need for prophylactic vaccination and inoculation against influenza -     Flu Vaccine QUAD 36+ mos IM   The patient was advised to call or come back to clinic if he does not see an improvement in symptoms, or worsens with the above plan.   Philis Fendt, MHS, PA-C [facility] 08/02/2015 3:54 PM

## 2015-08-02 NOTE — Telephone Encounter (Signed)
Spoke with pt, re-sent Rx.

## 2015-08-02 NOTE — Telephone Encounter (Signed)
Medication was not received by the pharmacy. Please resend!

## 2015-08-03 ENCOUNTER — Ambulatory Visit (INDEPENDENT_AMBULATORY_CARE_PROVIDER_SITE_OTHER): Payer: Medicare Other | Admitting: Family Medicine

## 2015-08-03 VITALS — BP 108/72 | HR 75 | Temp 98.3°F | Resp 16 | Ht 72.0 in | Wt 221.2 lb

## 2015-08-03 DIAGNOSIS — S61209D Unspecified open wound of unspecified finger without damage to nail, subsequent encounter: Secondary | ICD-10-CM

## 2015-08-03 DIAGNOSIS — L089 Local infection of the skin and subcutaneous tissue, unspecified: Secondary | ICD-10-CM | POA: Diagnosis not present

## 2015-08-03 DIAGNOSIS — S61402D Unspecified open wound of left hand, subsequent encounter: Secondary | ICD-10-CM

## 2015-08-03 MED ORDER — CEFTRIAXONE SODIUM 1 G IJ SOLR
1.0000 g | Freq: Once | INTRAMUSCULAR | Status: AC
  Administered 2015-08-03: 1 g via INTRAMUSCULAR

## 2015-08-03 NOTE — Patient Instructions (Signed)
Keep wound clean and dry  Continue antibiotics  Return on Sunday for recheck

## 2015-08-03 NOTE — Progress Notes (Addendum)
Patient ID: Thomas Bradley, male    DOB: November 01, 1945  Age: 69 y.o. MRN: [id number]  Chief Complaint  Patient presents with  . Follow-up    left hand, pointer finger, wound check    Subjective:   Patient is here for a recheck of the index finger. The wound split open secondary to the infection. It has drained some into the bandage. It is not throbbing though it is still painful.  Current allergies, medications, problem list, past/family and social histories reviewed.  Objective:  BP 108/72 mmHg  Pulse 75  Temp(Src) 98.3 F (36.8 C) (Oral)  Resp 16  Ht 6' (1.829 m)  Wt 221 lb 3.2 oz (100.336 kg)  BMI 29.99 kg/m2  SpO2 98%  He is numb in the nailbed area. The fingertip is swollen. There is a lot of white devitalized skin along the wound. Most of the wound is separated. Not having a lot of thick pus draining. It is tender.  It was washed. The wound will be dressed by my physician assistant. Assessment & Plan:   Assessment: 1. Wound, open, hand with or without fingers with complication, left, subsequent encounter   2. Infection of index finger       Plan: Continue to keep wound clean and dry and continue oral antibiotics. We'll give 1 more gram of Rocephin.  He is to return on Sunday for a recheck. If it is looking worse at any time we will refer him on over to hand specialist, but I think it's just going to take time at this point.    Meds ordered this encounter  Medications  . cefTRIAXone (ROCEPHIN) injection 1 g    Sig:     Order Specific Question:  Antibiotic Indication:    Answer:  Other Indication (list below)    Order Specific Question:  Other Indication:    Answer:  Infected finger         Patient Instructions  Keep wound clean and dry  Continue antibiotics  Return on Sunday for recheck     HOPPER,DAVID, MD 08/07/2015

## 2015-08-05 ENCOUNTER — Ambulatory Visit (INDEPENDENT_AMBULATORY_CARE_PROVIDER_SITE_OTHER): Payer: Medicare Other | Admitting: Family Medicine

## 2015-08-05 VITALS — BP 116/76 | HR 75 | Temp 97.9°F | Resp 16 | Ht 73.0 in | Wt 222.0 lb

## 2015-08-05 DIAGNOSIS — S61211S Laceration without foreign body of left index finger without damage to nail, sequela: Secondary | ICD-10-CM

## 2015-08-05 DIAGNOSIS — L089 Local infection of the skin and subcutaneous tissue, unspecified: Secondary | ICD-10-CM

## 2015-08-05 DIAGNOSIS — T148 Other injury of unspecified body region: Secondary | ICD-10-CM | POA: Diagnosis not present

## 2015-08-05 DIAGNOSIS — T148XXA Other injury of unspecified body region, initial encounter: Secondary | ICD-10-CM

## 2015-08-05 LAB — WOUND CULTURE
Gram Stain: NONE SEEN
Gram Stain: NONE SEEN
ORGANISM ID, BACTERIA: NO GROWTH

## 2015-08-05 NOTE — Progress Notes (Signed)
@[facility]FCLOGO @  This chart was scribed for Robyn Haber, MD by Thea Alken, ED Scribe. This patient was seen in room 7 and the patient's care was started at 11:25 AM.  Patient ID: LOT MEDFORD MRN: [id number], DOB: 1946/01/17, 69 y.o. Date of Encounter: 08/05/2015, 11:14 AM  Primary Physician: Kathlene November, MD  Chief Complaint:  Chief Complaint  Patient presents with   Follow-up    cut index finger with saw, x 10 days     HPI: 69 y.o. year old male with history below presents with for recheck of left finger laceration that occurred 1 days ago. Pt states he cut his finger on a table saw 11 days ago . He was seen here and received sutures as well as prescribe doxycycline. About 1 week later he began to experience increased swelling and tenderness to finger. He was diagnosed with an infection, had sutures removed and was prescribed Augmentin. He had a recheck of finger 2 days ago. At this time he denies being in pain. States he still has swelling and numbness to index finger. He is compliant to Augmentin.   Past Medical History  Diagnosis Date   Hyperlipemia    Hypertension    Benign prostatic hypertrophy     ED, sees urology every year   GERD (gastroesophageal reflux disease)    Prostatitis     2010, again 09-2011   Erythema multiforme     dx 2013    Nocturia    Erectile dysfunction due to arterial insufficiency     Dr. Risa Grill   Elevated PSA     Dr. Risa Grill   BPH (benign prostatic hypertrophy) with urinary obstruction     Dr. Risa Grill     Home Meds: Prior to Admission medications   Medication Sig Start Date End Date Taking? Authorizing Provider  amLODipine (NORVASC) 10 MG tablet Take 1 tablet (10 mg total) by mouth daily. 05/01/15  Yes Colon Branch, MD  amoxicillin-clavulanate (AUGMENTIN) 875-125 MG tablet Take 1 tablet by mouth 2 (two) times daily. 08/02/15  Yes Tereasa Coop, PA-C  aspirin 81 MG tablet Take 81 mg by mouth daily.   Yes Historical Provider, MD  Calcium  Carbonate-Vitamin D (CALCIUM 500 + D PO) Take 1 tablet by mouth daily.   Yes Historical Provider, MD  Cholecalciferol (VITAMIN D3) 2000 UNITS TABS Take 1 tablet by mouth daily.   Yes Historical Provider, MD  doxazosin (CARDURA) 8 MG tablet Take 1 tablet (8 mg total) by mouth at bedtime. 05/01/15  Yes Colon Branch, MD  EPINEPHrine (EPIPEN) 0.3 mg/0.3 mL DEVI Inject 0.3 mLs (0.3 mg total) into the muscle once. 12/20/11  Yes Posey Boyer, MD  HYDROcodone-acetaminophen (NORCO) 5-325 MG tablet Take 1 tablet by mouth every 6 (six) hours as needed. 07/25/15  Yes Tishira R Brewington, PA-C  Multiple Vitamin (MULTIVITAMIN) capsule Take 1 capsule by mouth daily.     Yes Historical Provider, MD  pravastatin (PRAVACHOL) 40 MG tablet Take 1 tablet (40 mg total) by mouth at bedtime. 05/01/15  Yes Colon Branch, MD    Allergies: No Known Allergies  Social History   Social History   Marital Status: Married    Spouse Name: N/A   Number of Children: 2   Years of Education: N/A   Occupational History   retired     Social History Main Topics   Smoking status: Former Smoker    Types: Cigarettes    Quit date: 11/13/1976   Smokeless tobacco:  Never Used   Alcohol Use: No   Drug Use: No   Sexual Activity: Not on file   Other Topics Concern   Not on file   Social History Narrative   Has 3 acres, works in the yard a lot x 6 hours sometimes, feels well.     4 G-kids          Review of Systems: Constitutional: negative for chills, fever, night sweats, weight changes, or fatigue  HEENT: negative for vision changes, hearing loss, congestion, rhinorrhea, ST, epistaxis, or sinus pressure Cardiovascular: negative for chest pain or palpitations Respiratory: negative for hemoptysis, wheezing, shortness of breath, or cough Abdominal: negative for abdominal pain, nausea, vomiting, diarrhea, or constipation Dermatological: negative for rash Neurologic: negative for headache, dizziness, or syncope All other  systems reviewed and are otherwise negative with the exception to those above and in the HPI.   Physical Exam: Blood pressure 116/76, pulse 75, temperature 97.9 F (36.6 C), temperature source Oral, resp. rate 16, height 6\' 1"  (1.854 m), weight 222 lb (100.699 kg), SpO2 98 %., Body mass index is 29.3 kg/(m^2). General: Well developed, well nourished, in no acute distress. Head: Normocephalic, atraumatic, eyes without discharge, sclera non-icteric, nares are without discharge. Bilateral auditory canals clear, TM's are without perforation, pearly grey and translucent with reflective cone of light bilaterally. Oral cavity moist, posterior pharynx without exudate, erythema, peritonsillar abscess, or post nasal drip.  Neck: Supple. No thyromegaly. Full ROM. No lymphadenopathy.  Msk:  Strength and tone normal for age. Extremities/Skin: Warm and dry. No clubbing or cyanosis. No edema. No rashes or suspicious lesions.I left index finger shows open wound which is mildly swollen, with clean base and no red streaks. He has good range of motion of the finger. Neuro: Alert and oriented X 3. Moves all extremities spontaneously. Gait is normal. CNII-XII grossly in tact. Psych:  Responds to questions appropriately with a normal affect.   Labs: Results for orders placed or performed in visit on 08/02/15  Wound culture  Result Value Ref Range   Gram Stain Few    Gram Stain WBC present-predominately PMN    Gram Stain No Squamous Epithelial Cells Seen    Gram Stain No Organisms Seen       ASSESSMENT AND PLAN:  69 y.o. year old male with healing wound infection This chart was scribed in my presence and reviewed by me personally.    ICD-9-CM ICD-10-CM   1. Wound, open, finger, subsequent encounter V58.89 S61.209D    883.0    2. Post-traumatic wound infection ([place]) 958.3 T14.8     L08.9    patient told to continue washing every 2 hours with soap and water. No further need for Epsom salts or other  caustic substances. He's to return if he has persistent swelling or worsening swelling, worsening pain, or discharge.  By signing my name below, I, Raven Small, attest that this documentation has been prepared under the direction and in the presence of Robyn Haber, MD.  Electronically Signed: Thea Alken, ED Scribe. 08/05/2015. 11:25 AM.  Signed, Robyn Haber, MD 08/05/2015 11:14 AM

## 2015-09-02 ENCOUNTER — Ambulatory Visit (INDEPENDENT_AMBULATORY_CARE_PROVIDER_SITE_OTHER): Payer: Medicare Other | Admitting: Family Medicine

## 2015-09-02 VITALS — BP 124/62 | HR 85 | Temp 98.1°F | Resp 16 | Ht 73.0 in | Wt 223.0 lb

## 2015-09-02 DIAGNOSIS — L089 Local infection of the skin and subcutaneous tissue, unspecified: Secondary | ICD-10-CM

## 2015-09-02 MED ORDER — AMOXICILLIN-POT CLAVULANATE 875-125 MG PO TABS: 1.0000 | ORAL_TABLET | Freq: Two times a day (BID) | ORAL | Status: DC

## 2015-09-02 MED ORDER — CEFTRIAXONE SODIUM 1 G IJ SOLR
1.0000 g | Freq: Once | INTRAMUSCULAR | Status: AC
  Administered 2015-09-02: 1 g via INTRAMUSCULAR

## 2015-09-02 NOTE — Progress Notes (Signed)
@[facility] @  This chart was scribed for Robyn Haber, MD by Thea Alken, ED Scribe. This patient was seen in room 3 and the patient's care was started at 12:58 PM.  Chief Complaint  Patient presents with  . infected finger    tender, stiff and swollen/ left index finger.  . Laceration    index finger, x 1 month, hasnt healed completely    Patient ID: Thomas Bradley MRN: [id number], DOB: April 23, 1946, 69 y.o. Date of Encounter: 09/02/2015, 12:52 PM  Primary Physician: Kathlene November, MD  Chief Complaint:  Chief Complaint  Patient presents with  . infected finger    tender, stiff and swollen/ left index finger.  . Laceration    index finger, x 1 month, hasnt healed completely    HPI: 69 y.o. year old male with history below presents with an infected left index finger. Pt was last seen her 1 month ago after lacerating left index fingers while using a table saw. Sutures were removed the following thew day after wound split open secondary to infection. last abx prescribed was Augmentin. States he's had persistent intermittent swelling and increased sensitivity to finger.      Past Medical History  Diagnosis Date  . Hyperlipemia   . Hypertension   . Benign prostatic hypertrophy     ED, sees urology every year  . GERD (gastroesophageal reflux disease)   . Prostatitis     2010, again 09-2011  . Erythema multiforme     dx 2013   . Nocturia   . Erectile dysfunction due to arterial insufficiency     Dr. Risa Grill  . Elevated PSA     Dr. Risa Grill  . BPH (benign prostatic hypertrophy) with urinary obstruction     Dr. Risa Grill     Home Meds: Prior to Admission medications   Medication Sig Start Date End Date Taking? Authorizing Provider  amLODipine (NORVASC) 10 MG tablet Take 1 tablet (10 mg total) by mouth daily. 05/01/15  Yes Colon Branch, MD  aspirin 81 MG tablet Take 81 mg by mouth daily.   Yes Historical Provider, MD  Calcium Carbonate-Vitamin D (CALCIUM 500 + D PO) Take 1 tablet by mouth  daily.   Yes Historical Provider, MD  Cholecalciferol (VITAMIN D3) 2000 UNITS TABS Take 1 tablet by mouth daily.   Yes Historical Provider, MD  doxazosin (CARDURA) 8 MG tablet Take 1 tablet (8 mg total) by mouth at bedtime. 05/01/15  Yes Colon Branch, MD  EPINEPHrine (EPIPEN) 0.3 mg/0.3 mL DEVI Inject 0.3 mLs (0.3 mg total) into the muscle once. 12/20/11  Yes Posey Boyer, MD  HYDROcodone-acetaminophen (NORCO) 5-325 MG tablet Take 1 tablet by mouth every 6 (six) hours as needed. 07/25/15  Yes Tishira R Brewington, PA-C  Multiple Vitamin (MULTIVITAMIN) capsule Take 1 capsule by mouth daily.     Yes Historical Provider, MD  pravastatin (PRAVACHOL) 40 MG tablet Take 1 tablet (40 mg total) by mouth at bedtime. 05/01/15  Yes Colon Branch, MD  amoxicillin-clavulanate (AUGMENTIN) 875-125 MG tablet Take 1 tablet by mouth 2 (two) times daily. Patient not taking: Reported on 09/02/2015 08/02/15   Tereasa Coop, PA-C    Allergies: No Known Allergies  Social History   Social History  . Marital Status: Married    Spouse Name: N/A  . Number of Children: 2  . Years of Education: N/A   Occupational History  . retired     Social History Main Topics  . Smoking status: Former  Smoker    Types: Cigarettes    Quit date: 11/13/1976  . Smokeless tobacco: Never Used  . Alcohol Use: No  . Drug Use: No  . Sexual Activity: Not on file   Other Topics Concern  . Not on file   Social History Narrative   Has 3 acres, works in the yard a lot x 6 hours sometimes, feels well.     4 G-kids          Review of Systems: Constitutional: negative for chills, fever, night sweats, weight changes, or fatigue  HEENT: negative for vision changes, hearing loss, congestion, rhinorrhea, ST, epistaxis, or sinus pressure Cardiovascular: negative for chest pain or palpitations Respiratory: negative for hemoptysis, wheezing, shortness of breath, or cough Abdominal: negative for abdominal pain, nausea, vomiting, diarrhea, or  constipation Dermatological: negative for rash Neurologic: negative for headache, dizziness, or syncope All other systems reviewed and are otherwise negative with the exception to those above and in the HPI.   Physical Exam: Blood pressure 124/62, pulse 85, temperature 98.1 F (36.7 C), temperature source Oral, resp. rate 16, height 6\' 1"  (1.854 m), weight 223 lb (101.152 kg), SpO2 97 %., Body mass index is 29.43 kg/(m^2). General: Well developed, well nourished, in no acute distress. Head: Normocephalic, atraumatic, eyes without discharge, sclera non-icteric, nares are without discharge. Bilateral auditory canals clear, TM's are without perforation, pearly grey and translucent with reflective cone of light bilaterally. Oral cavity moist, posterior pharynx without exudate, erythema, peritonsillar abscess, or post nasal drip.  Neck: Supple. No thyromegaly. Full ROM. No lymphadenopathy. Lungs: Clear bilaterally to auscultation without wheezes, rales, or rhonchi. Breathing is unlabored. Heart: RRR with S1 S2. No murmurs, rubs, or gallops appreciated. Abdomen: Soft, non-tender, non-distended with normoactive bowel sounds. No hepatomegaly. No rebound/guarding. No obvious abdominal masses. Msk:  Strength and tone normal for age. Extremities/Skin: Warm and dry. No clubbing or cyanosis. No edema. No rashes or suspicious lesions. Left index finger is diffusely red, swollen and tender particular in distal phalanx. With incomplete wound healing from initially laceration.  Neuro: Alert and oriented X 3. Moves all extremities spontaneously. Gait is normal. CNII-XII grossly in tact. Psych:  Responds to questions appropriately with a normal affect.   Labs:   ASSESSMENT AND PLAN:  69 y.o. year old male with recurrent cellulitis of the index finger, left hand with poor wound healing that needs to be revised. This chart was scribed in my presence and reviewed by me personally.    ICD-9-CM ICD-10-CM   1.  Finger infection 686.9 L08.9 cefTRIAXone (ROCEPHIN) injection 1 g     amoxicillin-clavulanate (AUGMENTIN) 875-125 MG tablet     Consult to hand surgery      By signing my name below, I, Raven Small, attest that this documentation has been prepared under the direction and in the presence of Robyn Haber, MD.  Electronically Signed: Thea Alken, ED Scribe. 09/02/2015. 12:59 PM.  Signed, Robyn Haber, MD 09/02/2015 12:52 PM

## 2015-09-03 ENCOUNTER — Telehealth: Payer: Self-pay

## 2015-09-03 NOTE — Telephone Encounter (Signed)
Patient's wife called to in

## 2015-09-03 NOTE — Telephone Encounter (Signed)
Patient's wife called in to check on a referral for her husband.  She said Dr Joseph Art was supposed to refer him to a specialist for his infected finger after his OV on 11/6.  No referral has been placed for the patient.  Please advise, thank you.  CB#: [phone]

## 2015-09-04 ENCOUNTER — Other Ambulatory Visit: Payer: Self-pay | Admitting: Family Medicine

## 2015-09-04 DIAGNOSIS — L089 Local infection of the skin and subcutaneous tissue, unspecified: Secondary | ICD-10-CM

## 2015-09-14 DIAGNOSIS — D225 Melanocytic nevi of trunk: Secondary | ICD-10-CM | POA: Diagnosis not present

## 2015-09-14 DIAGNOSIS — D229 Melanocytic nevi, unspecified: Secondary | ICD-10-CM | POA: Diagnosis not present

## 2015-09-14 DIAGNOSIS — L821 Other seborrheic keratosis: Secondary | ICD-10-CM | POA: Diagnosis not present

## 2015-09-14 DIAGNOSIS — D2372 Other benign neoplasm of skin of left lower limb, including hip: Secondary | ICD-10-CM | POA: Diagnosis not present

## 2015-09-14 DIAGNOSIS — D2271 Melanocytic nevi of right lower limb, including hip: Secondary | ICD-10-CM | POA: Diagnosis not present

## 2015-09-14 DIAGNOSIS — D2262 Melanocytic nevi of left upper limb, including shoulder: Secondary | ICD-10-CM | POA: Diagnosis not present

## 2015-11-02 ENCOUNTER — Encounter: Payer: Self-pay | Admitting: Internal Medicine

## 2015-11-02 ENCOUNTER — Ambulatory Visit (INDEPENDENT_AMBULATORY_CARE_PROVIDER_SITE_OTHER): Payer: Medicare Other | Admitting: Internal Medicine

## 2015-11-02 VITALS — BP 116/66 | HR 58 | Temp 98.3°F | Ht 73.0 in | Wt 225.2 lb

## 2015-11-02 DIAGNOSIS — N400 Enlarged prostate without lower urinary tract symptoms: Secondary | ICD-10-CM

## 2015-11-02 DIAGNOSIS — R7303 Prediabetes: Secondary | ICD-10-CM | POA: Diagnosis not present

## 2015-11-02 DIAGNOSIS — I10 Essential (primary) hypertension: Secondary | ICD-10-CM

## 2015-11-02 NOTE — Progress Notes (Signed)
Pre visit review using our clinic review tool, if applicable. No additional management support is needed unless otherwise documented below in the visit note. 

## 2015-11-02 NOTE — Patient Instructions (Addendum)
BEFORE YOU LEAVE THE OFFICE: GO TO THE FRONT DESK  Schedule a complete physical exam to be done in 6 months Please be fasting

## 2015-11-02 NOTE — Progress Notes (Signed)
Subjective:    Patient ID: Thomas Bradley, male    DOB: 30-Jul-1946, 70 y.o.   MRN: [id number]  DOS:  11/02/2015 Type of visit - description : Routine checkup Interval history: HTN: Good compliance with medication, ambulatory BPs 113/70 an average for  last few weeks BPH, elevated PSA: Note from urology reviewed. Had a injury of the left index finger, developed a infection of almost the entire finger.  is better however the range of motion has not returned to normal  Review of Systems Has nocturia 2 qhs , at baseline; no other urinary sx Denies chest pain or difficulty breathing. Very active without DOE, no palpitations or presyncope No nausea, vomiting, diarrhea  Past Medical History  Diagnosis Date  . Hyperlipemia   . Hypertension   . Benign prostatic hypertrophy     ED, sees urology every year  . GERD (gastroesophageal reflux disease)   . Prostatitis     2010, again 09-2011  . Erythema multiforme     dx 2013   . Nocturia   . Erectile dysfunction due to arterial insufficiency     Dr. Risa Grill  . Elevated PSA     Dr. Risa Grill  . BPH (benign prostatic hypertrophy) with urinary obstruction     Dr. Risa Grill    Past Surgical History  Procedure Laterality Date  . Hernia repair  07-2010    open, R inguinal w/ mesh    Social History   Social History  . Marital Status: Married    Spouse Name: N/A  . Number of Children: 2  . Years of Education: N/A   Occupational History  . retired     Social History Main Topics  . Smoking status: Former Smoker    Types: Cigarettes    Quit date: 11/13/1976  . Smokeless tobacco: Never Used  . Alcohol Use: No  . Drug Use: No  . Sexual Activity: Not on file   Other Topics Concern  . Not on file   Social History Narrative   Has 3 acres, works in the yard a lot x 6 hours sometimes, feels well.     4 G-kids             Medication List       This list is accurate as of: 11/02/15 11:59 PM.  Always use your most recent med list.                 amLODipine 10 MG tablet  Commonly known as:  NORVASC  Take 1 tablet (10 mg total) by mouth daily.     aspirin 81 MG tablet  Take 81 mg by mouth daily.     B-12 2500 MCG Tabs  Take 1 tablet by mouth daily.     CALCIUM 500 + D PO  Take 1 tablet by mouth daily.     Co Q-10 300 MG Caps  Take 1 capsule by mouth daily.     doxazosin 8 MG tablet  Commonly known as:  CARDURA  Take 1 tablet (8 mg total) by mouth at bedtime.     EPINEPHrine 0.3 mg/0.3 mL Devi  Commonly known as:  EPIPEN  Inject 0.3 mLs (0.3 mg total) into the muscle once.     multivitamin capsule  Take 1 capsule by mouth daily.     pravastatin 40 MG tablet  Commonly known as:  PRAVACHOL  Take 1 tablet (40 mg total) by mouth at bedtime.     Vitamin D3 2000  units Tabs  Take 1 tablet by mouth daily.           Objective:   Physical Exam BP 116/66 mmHg  Pulse 58  Temp(Src) 98.3 F (36.8 C) (Oral)  Ht 6\' 1"  (1.854 m)  Wt 225 lb 4 oz (102.173 kg)  BMI 29.72 kg/m2  SpO2 97% General:   Well developed, well nourished . NAD.  HEENT:  Normocephalic . Face symmetric, atraumatic Lungs:  CTA B Normal respiratory effort, no intercostal retractions, no accessory muscle use. Heart: Regular?,  no murmur.  No pretibial edema bilaterally  MSK: Left index finger w/ well healed distal injury. No redness, warmness. DIP and PIP ROM  decrease Skin: Not pale. Not jaundice Neurologic:  alert & oriented X3.  Speech normal, gait appropriate for age and unassisted Psych--  Cognition and judgment appear intact.  Cooperative with normal attention span and concentration.  Behavior appropriate. No anxious or depressed appearing.      Assessment & Plan:   Assessment Prediabetes HTN Hyperlipidemia GERD Erythema multiforme 2013 GU: --BPH w/ elevated PSA --Prostatitis 2010, 2012 --ED   PLAN Chart reviewed:Coloscopy 06-2015: Negative except for hemorrhoids, next in 5 years Prediabetes: Last A1c  satisfactory. No change HTN: Under excellent control. Arrhythmia? EKG informally discuss with cardiology via message: felt to be atrial rhythm w/ short PR, present since 2013, no A. fib. Plan: Since he is not a new finding and he is asymptomatic will Rx observation.   BPH, saw urology 04-2015, DRE was confirmed to be abnormal, PSA  was 3.0 per pt,  better than before. No bx was needed. Plans to see urology again soon. Finger laceration: ROM not fully recovered. We discussed possible orthopedic referral RTC 6 months CPX

## 2015-11-12 DIAGNOSIS — H35033 Hypertensive retinopathy, bilateral: Secondary | ICD-10-CM | POA: Diagnosis not present

## 2015-11-12 DIAGNOSIS — H524 Presbyopia: Secondary | ICD-10-CM | POA: Diagnosis not present

## 2015-11-12 DIAGNOSIS — H26033 Infantile and juvenile nuclear cataract, bilateral: Secondary | ICD-10-CM | POA: Diagnosis not present

## 2015-11-30 DIAGNOSIS — Z0000 Encounter for general adult medical examination without abnormal findings: Secondary | ICD-10-CM | POA: Diagnosis not present

## 2015-11-30 DIAGNOSIS — N138 Other obstructive and reflux uropathy: Secondary | ICD-10-CM | POA: Diagnosis not present

## 2015-11-30 DIAGNOSIS — N401 Enlarged prostate with lower urinary tract symptoms: Secondary | ICD-10-CM | POA: Diagnosis not present

## 2015-11-30 DIAGNOSIS — R351 Nocturia: Secondary | ICD-10-CM | POA: Diagnosis not present

## 2015-11-30 DIAGNOSIS — R972 Elevated prostate specific antigen [PSA]: Secondary | ICD-10-CM | POA: Diagnosis not present

## 2015-11-30 DIAGNOSIS — N5201 Erectile dysfunction due to arterial insufficiency: Secondary | ICD-10-CM | POA: Diagnosis not present

## 2015-11-30 LAB — PSA: PSA: 2.17

## 2015-12-18 ENCOUNTER — Other Ambulatory Visit: Payer: Self-pay

## 2015-12-18 MED ORDER — AMLODIPINE BESYLATE 10 MG PO TABS: 10.0000 mg | ORAL_TABLET | Freq: Every day | ORAL | Status: DC

## 2015-12-18 MED ORDER — DOXAZOSIN MESYLATE 8 MG PO TABS: 8.0000 mg | ORAL_TABLET | Freq: Every day | ORAL | Status: DC

## 2015-12-18 MED ORDER — PRAVASTATIN SODIUM 40 MG PO TABS: 40.0000 mg | ORAL_TABLET | Freq: Every day | ORAL | Status: DC

## 2016-02-01 ENCOUNTER — Encounter: Payer: Self-pay | Admitting: Internal Medicine

## 2016-02-01 ENCOUNTER — Ambulatory Visit (INDEPENDENT_AMBULATORY_CARE_PROVIDER_SITE_OTHER): Payer: Medicare Other | Admitting: Internal Medicine

## 2016-02-01 VITALS — BP 128/72 | HR 77 | Temp 97.7°F | Ht 73.0 in | Wt 219.4 lb

## 2016-02-01 DIAGNOSIS — Z09 Encounter for follow-up examination after completed treatment for conditions other than malignant neoplasm: Secondary | ICD-10-CM | POA: Diagnosis not present

## 2016-02-01 DIAGNOSIS — K5900 Constipation, unspecified: Secondary | ICD-10-CM

## 2016-02-01 NOTE — Assessment & Plan Note (Signed)
Constipation X 4 weeks, no obvious  etiology, no new meds or different diet per pt  No red flag symptoms, last colonoscopy a few months ago and normal. Recommend increased fluid intake, eat fruits vegetables, MiraLAX, stop calcium supplements and call if not back to normal in few days.

## 2016-02-01 NOTE — Progress Notes (Signed)
Subjective:    Patient ID: Thomas Bradley, male    DOB: Aug 17, 1946, 70 y.o.   MRN: [id number]  DOS:  02/01/2016 Type of visit - description : Acute visit Interval history: Constipation for the last 4 weeks, used to have bowel movements regularly, now every 3 or sometimes even 5 days. When he sits  down to have a bowel movement, he has been occasional nauseated and vomited twice. On MiraLAX -- seems to help  Review of Systems denies fever chills No blood in the stools or abdominal pain. No abdominal distention No GERD symptoms. No rectal pain or itching.  Past Medical History  Diagnosis Date  . Hyperlipemia   . Hypertension   . Benign prostatic hypertrophy     ED, sees urology every year  . GERD (gastroesophageal reflux disease)   . Prostatitis     2010, again 09-2011  . Erythema multiforme     dx 2013   . Nocturia   . Erectile dysfunction due to arterial insufficiency     Dr. Risa Grill  . Elevated PSA     Dr. Risa Grill  . BPH (benign prostatic hypertrophy) with urinary obstruction     Dr. Risa Grill    Past Surgical History  Procedure Laterality Date  . Hernia repair  07-2010    open, R inguinal w/ mesh    Social History   Social History  . Marital Status: Married    Spouse Name: N/A  . Number of Children: 2  . Years of Education: N/A   Occupational History  . retired     Social History Main Topics  . Smoking status: Former Smoker    Types: Cigarettes    Quit date: 11/13/1976  . Smokeless tobacco: Never Used  . Alcohol Use: No  . Drug Use: No  . Sexual Activity: Not on file   Other Topics Concern  . Not on file   Social History Narrative   Has 3 acres, works in the yard a lot x 6 hours sometimes, feels well.     4 G-kids             Medication List       This list is accurate as of: 02/01/16  5:38 PM.  Always use your most recent med list.               amLODipine 10 MG tablet  Commonly known as:  NORVASC  Take 1 tablet (10 mg total) by mouth  daily.     aspirin 81 MG tablet  Take 81 mg by mouth daily.     B-12 2500 MCG Tabs  Take 1 tablet by mouth daily. Reported on 02/01/2016     Co Q-10 300 MG Caps  Take 1 capsule by mouth daily. Reported on 02/01/2016     doxazosin 8 MG tablet  Commonly known as:  CARDURA  Take 1 tablet (8 mg total) by mouth at bedtime.     EPINEPHrine 0.3 mg/0.3 mL Devi  Commonly known as:  EPIPEN  Inject 0.3 mLs (0.3 mg total) into the muscle once.     ibuprofen 200 MG tablet  Commonly known as:  ADVIL,MOTRIN  Take 100 mg by mouth at bedtime as needed.     multivitamin capsule  Take 1 capsule by mouth daily. Reported on 02/01/2016     pravastatin 40 MG tablet  Commonly known as:  PRAVACHOL  Take 1 tablet (40 mg total) by mouth at bedtime.  Vitamin D3 2000 units Tabs  Take 1 tablet by mouth daily. Reported on 02/01/2016           Objective:   Physical Exam BP 128/72 mmHg  Pulse 77  Temp(Src) 97.7 F (36.5 C) (Oral)  Ht 6\' 1"  (1.854 m)  Wt 219 lb 6 oz (99.508 kg)  BMI 28.95 kg/m2  SpO2 97% General:   Well developed, well nourished . NAD.  HEENT:  Normocephalic . Face symmetric, atraumatic Lungs:  CTA B Normal respiratory effort, no intercostal retractions, no accessory muscle use. Heart: RRR,  no murmur.  no pretibial edema bilaterally  Abdomen:  Not distended, soft, non-tender. No rebound or rigidity.  Skin: Not pale. Not jaundice Neurologic:  alert & oriented X3.  Speech normal, gait appropriate for age and unassisted Psych--  Cognition and judgment appear intact.  Cooperative with normal attention span and concentration.  Behavior appropriate. No anxious or depressed appearing.    Assessment & Plan:   Assessment Prediabetes HTN Hyperlipidemia GERD Erythema multiforme 2013 GU: --BPH w/ elevated PSA --Prostatitis 2010, 2012 --ED   PLAN Constipation X 4 weeks, no obvious  etiology, no new meds or different diet per pt  No red flag symptoms, last  colonoscopy a few months ago and normal. Recommend increased fluid intake, eat fruits vegetables, MiraLAX, stop calcium supplements and call if not back to normal in few days.

## 2016-02-01 NOTE — Patient Instructions (Signed)
Drink plenty of fluids  Eat fruits and vegetables  MiraLAX 17 g with fluids every day as needed  Go back to your supplements except calcium  Call anytime if severe symptoms, fever, chills, abdominal pain or blood in the stools

## 2016-02-01 NOTE — Progress Notes (Signed)
Pre visit review using our clinic review tool, if applicable. No additional management support is needed unless otherwise documented below in the visit note. 

## 2016-04-08 DIAGNOSIS — M25511 Pain in right shoulder: Secondary | ICD-10-CM | POA: Diagnosis not present

## 2016-05-06 ENCOUNTER — Ambulatory Visit (INDEPENDENT_AMBULATORY_CARE_PROVIDER_SITE_OTHER): Payer: Medicare Other | Admitting: Internal Medicine

## 2016-05-06 ENCOUNTER — Encounter: Payer: Self-pay | Admitting: Internal Medicine

## 2016-05-06 VITALS — BP 122/70 | HR 68 | Temp 97.8°F | Ht 73.0 in | Wt 216.5 lb

## 2016-05-06 DIAGNOSIS — I10 Essential (primary) hypertension: Secondary | ICD-10-CM | POA: Diagnosis not present

## 2016-05-06 DIAGNOSIS — Z09 Encounter for follow-up examination after completed treatment for conditions other than malignant neoplasm: Secondary | ICD-10-CM | POA: Diagnosis not present

## 2016-05-06 DIAGNOSIS — Z0000 Encounter for general adult medical examination without abnormal findings: Secondary | ICD-10-CM

## 2016-05-06 DIAGNOSIS — E785 Hyperlipidemia, unspecified: Secondary | ICD-10-CM | POA: Diagnosis not present

## 2016-05-06 DIAGNOSIS — R739 Hyperglycemia, unspecified: Secondary | ICD-10-CM

## 2016-05-06 LAB — HEMOGLOBIN A1C: Hgb A1c MFr Bld: 6.3 % (ref 4.6–6.5)

## 2016-05-06 LAB — BASIC METABOLIC PANEL
BUN: 23 mg/dL (ref 6–23)
CALCIUM: 9.6 mg/dL (ref 8.4–10.5)
CHLORIDE: 107 meq/L (ref 96–112)
CO2: 28 mEq/L (ref 19–32)
CREATININE: 1.04 mg/dL (ref 0.40–1.50)
GFR: 74.53 mL/min (ref >60.00)
Glucose, Bld: 103 mg/dL — ABNORMAL HIGH (ref 70–99)
Potassium: 4.1 mEq/L (ref 3.5–5.1)
Sodium: 141 mEq/L (ref 135–145)

## 2016-05-06 LAB — ALT: ALT: 14 U/L (ref 0–53)

## 2016-05-06 LAB — LIPID PANEL
CHOL/HDL RATIO: 4
CHOLESTEROL: 160 mg/dL (ref 0–200)
HDL: 40 mg/dL (ref >39.00)
LDL CALC: 96 mg/dL (ref 0–99)
NonHDL: 119.56
TRIGLYCERIDES: 116 mg/dL (ref 0.0–149.0)
VLDL: 23.2 mg/dL (ref 0.0–40.0)

## 2016-05-06 LAB — AST: AST: 16 U/L (ref 0–37)

## 2016-05-06 NOTE — Assessment & Plan Note (Signed)
DM: Has a healthy lifestyle, check A1c HTN: Ambulatory BP is very good, cont  amlodipine High cholesterol: Continue Pravachol, labs BPH, increased PSA: Last PSA at this office was  elevated, last visit with urology 12-2015, they check a PSA and asked pt to RTC in 1 year or prn RTC 1 year

## 2016-05-06 NOTE — Patient Instructions (Addendum)
Get your blood work before you leave   Next visit in one year, fasting, physical exam  Fall Prevention and [place] cause injuries and can affect all age groups. It is possible to use preventive measures to significantly decrease the likelihood of falls. There are many simple measures which can make your home safer and prevent falls. OUTDOORS  Repair cracks and edges of walkways and driveways.  Remove high doorway thresholds.  Trim shrubbery on the main path into your home.  Have good outside lighting.  Clear walkways of tools, rocks, debris, and clutter.  Check that handrails are not broken and are securely fastened. Both sides of steps should have handrails.  Have leaves, snow, and ice cleared regularly.  Use sand or salt on walkways during winter months.  In the garage, clean up grease or oil spills. BATHROOM  Install night lights.  Install grab bars by the toilet and in the tub and shower.  Use non-skid mats or decals in the tub or shower.  Place a plastic non-slip stool in the shower to sit on, if needed.  Keep floors dry and clean up all water on the floor immediately.  Remove soap buildup in the tub or shower on a regular basis.  Secure bath mats with non-slip, double-sided rug tape.  Remove throw rugs and tripping hazards from the floors. BEDROOMS  Install night lights.  Make sure a bedside light is easy to reach.  Do not use oversized bedding.  Keep a telephone by your bedside.  Have a firm chair with side arms to use for getting dressed.  Remove throw rugs and tripping hazards from the floor. KITCHEN  Keep handles on pots and pans turned toward the center of the stove. Use back burners when possible.  Clean up spills quickly and allow time for drying.  Avoid walking on wet floors.  Avoid hot utensils and knives.  Position shelves so they are not too high or low.  Place commonly used objects within easy reach.  If necessary, use a  sturdy step stool with a grab bar when reaching.  Keep electrical cables out of the way.  Do not use floor polish or wax that makes floors slippery. If you must use wax, use non-skid floor wax.  Remove throw rugs and tripping hazards from the floor. STAIRWAYS  Never leave objects on stairs.  Place handrails on both sides of stairways and use them. Fix any loose handrails. Make sure handrails on both sides of the stairways are as long as the stairs.  Check carpeting to make sure it is firmly attached along stairs. Make repairs to worn or loose carpet promptly.  Avoid placing throw rugs at the top or bottom of stairways, or properly secure the rug with carpet tape to prevent slippage. Get rid of throw rugs, if possible.  Have an electrician put in a light switch at the top and bottom of the stairs. OTHER FALL PREVENTION TIPS  Wear low-heel or rubber-soled shoes that are supportive and fit well. Wear closed toe shoes.  When using a stepladder, make sure it is fully opened and both spreaders are firmly locked. Do not climb a closed stepladder.  Add color or contrast paint or tape to grab bars and handrails in your home. Place contrasting color strips on first and last steps.  Learn and use mobility aids as needed. Install an electrical emergency response system.  Turn on lights to avoid dark areas. Replace light bulbs that burn out immediately.  Get light switches that glow.  Arrange furniture to create clear pathways. Keep furniture in the same place.  Firmly attach carpet with non-skid or double-sided tape.  Eliminate uneven floor surfaces.  Select a carpet pattern that does not visually hide the edge of steps.  Be aware of all pets. OTHER HOME SAFETY TIPS  Set the water temperature for 120 F (48.8 C).  Keep emergency numbers on or near the telephone.  Keep smoke detectors on every level of the home and near sleeping areas. Document Released: 10/03/2002 Document Revised:  04/13/2012 Document Reviewed: 01/02/2012 [facility] Patient Information 2015 [place], [place]. This information is not intended to replace advice given to you by your health care provider. Make sure you discuss any questions you have with your health care provider.   Preventive Care for Adults Ages 33 and over  Blood pressure check.** / Every 1 to 2 years.  Lipid and cholesterol check.**/ Every 5 years beginning at age 38.  Lung cancer screening. / Every year if you are aged 63-80 years and have a 30-pack-year history of smoking and currently smoke or have quit within the past 15 years. Yearly screening is stopped once you have quit smoking for at least 15 years or develop a health problem that would prevent you from having lung cancer treatment.  Fecal occult blood test (FOBT) of stool. / Every year beginning at age 66 and continuing until age 9. You may not have to do this test if you get a colonoscopy every 10 years.  Flexible sigmoidoscopy** or colonoscopy.** / Every 5 years for a flexible sigmoidoscopy or every 10 years for a colonoscopy beginning at age 11 and continuing until age 83.  Hepatitis C blood test.** / For all people born from [birth year] through 1967 and any individual with known risks for hepatitis C.  Abdominal aortic aneurysm (AAA) screening.** / A one-time screening for ages 50 to 72 years who are current or former smokers.  Skin self-exam. / Monthly.  Influenza vaccine. / Every year.  Tetanus, diphtheria, and acellular pertussis (Tdap/Td) vaccine.** / 1 dose of Td every 10 years.  Varicella vaccine.** / Consult your health care provider.  Zoster vaccine.** / 1 dose for adults aged 85 years or older.  Pneumococcal 13-valent conjugate (PCV13) vaccine.** / Consult your health care provider.  Pneumococcal polysaccharide (PPSV23) vaccine.** / 1 dose for all adults aged 9 years and older.  Meningococcal vaccine.** / Consult your health care provider.  Hepatitis A  vaccine.** / Consult your health care provider.  Hepatitis B vaccine.** / Consult your health care provider.  Haemophilus influenzae type b (Hib) vaccine.** / Consult your health care provider. **Family history and personal history of risk and conditions may change your health care provider's recommendations. Document Released: 12/09/2001 Document Revised: 10/18/2013 Document Reviewed: 03/10/2011 [facility] Patient Information 2015 [place], [place]. This information is not intended to replace advice given to you by your health care provider. Make sure you discuss any questions you have with your health care provider.

## 2016-05-06 NOTE — Progress Notes (Signed)
Pre visit review using our clinic review tool, if applicable. No additional management support is needed unless otherwise documented below in the visit note. 

## 2016-05-06 NOTE — Progress Notes (Signed)
Subjective:    Patient ID: Thomas Bradley, male    DOB: 1945/12/10, 70 y.o.   MRN: [id number]  DOS:  05/06/2016  Type of visit - description :    Here for Medicare AWV:   1. Risk factors based on Past M, S, F history: reviewed   2. Physical Activities: Active, yard work    3. Depression/mood: Neg screening   4. Hearing: Some tinnitus, slt decreased R hearing,  stable x years,s/p audiology eval years ago 5. ADL's: Totally independent   6. Fall Risk: no h/o , low risk , prevention discussed 7. home Safety: does feel safe at home   8. Height, weight, &visual acuity: see VS, uses glasses , eyes are checked yearly   9. Counseling: provided   10. Labs ordered based on risk factors: if needed   11. Referral Coordination: if needed   12. Care Plan, see assessment and plan  . Written plan provided 13. Cognitive Assessment:motor skills and cognition wnl , above average for age   6. Care team updated   15. End of life care discussed -- rec a HC-POA  In addition, today we discussed the following: HTN: Good med compliance, ambulatory BPs 114/82 Cholesterol: On statins, no apparent side effects. Increased PSA, BPH, last visit with urology 12-2015, RTC recommended in one year or as needed.  Review of Systems  Constitutional: No fever. No chills. No unexplained wt changes. No unusual sweats  HEENT: No dental problems, no ear discharge, no facial swelling, no voice changes. No eye discharge, no eye  redness , no  intolerance to light   Respiratory: No wheezing , no  difficulty breathing. No cough , no mucus production  Cardiovascular: No CP, no leg swelling , no  Palpitations  GI: no nausea, no vomiting, no diarrhea , no  abdominal pain.  No blood in the stools. No dysphagia, no odynophagia    Endocrine: No polyphagia, no polyuria , no polydipsia  GU: No dysuria, gross hematuria, difficulty urinating. No urinary urgency, no frequency.  Musculoskeletal: No joint swellings or unusual  aches or pains  Skin: No change in the color of the skin, palor , no  Rash  Allergic, immunologic: No environmental allergies , no  food allergies  Neurological: No dizziness no  syncope. No headaches. No diplopia, no slurred, no slurred speech, no motor deficits, no facial  Numbness  Hematological: No enlarged lymph nodes, no easy bruising , no unusual bleedings  Psychiatry: No suicidal ideas, no hallucinations, no beavior problems, no confusion.  No unusual/severe anxiety, no depression  Past Medical History  Diagnosis Date  . Hyperlipemia   . Hypertension   . Benign prostatic hypertrophy     ED, sees urology every year  . GERD (gastroesophageal reflux disease)   . Prostatitis     2010, again 09-2011  . Erythema multiforme     dx 2013   . Nocturia   . Erectile dysfunction due to arterial insufficiency     Dr. Risa Grill  . Elevated PSA     Dr. Risa Grill  . BPH (benign prostatic hypertrophy) with urinary obstruction     Dr. Risa Grill    Past Surgical History  Procedure Laterality Date  . Hernia repair  07-2010    open, R inguinal w/ mesh    Social History   Social History  . Marital Status: Married    Spouse Name: N/A  . Number of Children: 2  . Years of Education: N/A   Occupational  History  . retired     Social History Main Topics  . Smoking status: Former Smoker    Types: Cigarettes    Quit date: 11/13/1976  . Smokeless tobacco: Never Used  . Alcohol Use: No  . Drug Use: No  . Sexual Activity: Not on file   Other Topics Concern  . Not on file   Social History Narrative   Has 3 acres, works in the yard a lot x 6 hours sometimes, feels well.     4 G-kids          Family History  Problem Relation Age of Onset  . Coronary artery disease Father 13  . Diabetes Other     brothers  . Hypertension Sister   . Stroke Neg Hx   . Colon cancer Neg Hx   . Prostate cancer Neg Hx        Medication List       This list is accurate as of: 05/06/16 12:49 PM.   Always use your most recent med list.               amLODipine 10 MG tablet  Commonly known as:  NORVASC  Take 1 tablet (10 mg total) by mouth daily.     aspirin 81 MG tablet  Take 81 mg by mouth daily.     B-12 2500 MCG Tabs  Take 1 tablet by mouth daily. Reported on 02/01/2016     Co Q-10 300 MG Caps  Take 1 capsule by mouth daily. Reported on 02/01/2016     doxazosin 8 MG tablet  Commonly known as:  CARDURA  Take 1 tablet (8 mg total) by mouth at bedtime.     EPINEPHrine 0.3 mg/0.3 mL Devi  Commonly known as:  EPIPEN  Inject 0.3 mLs (0.3 mg total) into the muscle once.     ibuprofen 200 MG tablet  Commonly known as:  ADVIL,MOTRIN  Take 100 mg by mouth at bedtime as needed.     multivitamin capsule  Take 1 capsule by mouth daily. Reported on 02/01/2016     pravastatin 40 MG tablet  Commonly known as:  PRAVACHOL  Take 1 tablet (40 mg total) by mouth at bedtime.     Vitamin D3 2000 units Tabs  Take 1 tablet by mouth daily. Reported on 02/01/2016           Objective:   Physical Exam BP 122/70 mmHg  Pulse 68  Temp(Src) 97.8 F (36.6 C) (Oral)  Ht 6\' 1"  (1.854 m)  Wt 216 lb 8 oz (98.204 kg)  BMI 28.57 kg/m2  SpO2 97%  General:   Well developed, well nourished . NAD.  Neck: No  thyromegaly  HEENT:  Normocephalic . Face symmetric, atraumatic Lungs:  CTA B Normal respiratory effort, no intercostal retractions, no accessory muscle use. Heart: RRR,  no murmur.  No pretibial edema bilaterally  Abdomen:  Not distended, soft, non-tender. No rebound or rigidity.   Skin: Exposed areas without rash. Not pale. Not jaundice Neurologic:  alert & oriented X3.  Speech normal, gait appropriate for age and unassisted Strength symmetric and appropriate for age.  Psych: Cognition and judgment appear intact.  Cooperative with normal attention span and concentration.  Behavior appropriate. No anxious or depressed appearing.    Assessment & Plan:    Assessment Prediabetes HTN Hyperlipidemia GERD Erythema multiforme 2013 GU: --BPH w/ elevated PSA --Prostatitis 2010, 2012 --ED Has a epi-pen -- reason? Pt not sure, had an allergy  to a medicine, name?  PLAN DM: Has a healthy lifestyle, check A1c HTN: Ambulatory BP is very good, cont  amlodipine High cholesterol: Continue Pravachol, labs BPH, increased PSA: Last PSA at this office was  elevated, last visit with urology 12-2015, they check a PSA and asked pt to RTC in 1 year or prn RTC 1 year

## 2016-05-06 NOTE — Assessment & Plan Note (Addendum)
Td   01-2011;  pneumonia shot 2010;  prevnar-- 2015;  shingles shot 07-22-10   colonoscopy 2004,Dr Santogade, colonoscopy again 05-2010 -Dr Michail Sermon-, hyperplastic polyps, cscope 06-2015: no polyps, 5 years per report Prostate cancer screening: See comments under BPH cont healthy life style Labs: BMP, AST, ALT, FLP, A1c

## 2016-07-11 ENCOUNTER — Other Ambulatory Visit: Payer: Self-pay | Admitting: Internal Medicine

## 2016-07-23 DIAGNOSIS — M1612 Unilateral primary osteoarthritis, left hip: Secondary | ICD-10-CM | POA: Diagnosis not present

## 2016-07-23 DIAGNOSIS — M25561 Pain in right knee: Secondary | ICD-10-CM | POA: Diagnosis not present

## 2016-07-23 DIAGNOSIS — M25552 Pain in left hip: Secondary | ICD-10-CM | POA: Diagnosis not present

## 2016-08-14 DIAGNOSIS — Z23 Encounter for immunization: Secondary | ICD-10-CM | POA: Diagnosis not present

## 2016-09-09 DIAGNOSIS — M25511 Pain in right shoulder: Secondary | ICD-10-CM | POA: Diagnosis not present

## 2016-09-09 DIAGNOSIS — M75111 Incomplete rotator cuff tear or rupture of right shoulder, not specified as traumatic: Secondary | ICD-10-CM | POA: Diagnosis not present

## 2016-09-17 DIAGNOSIS — M25511 Pain in right shoulder: Secondary | ICD-10-CM | POA: Diagnosis not present

## 2016-09-24 DIAGNOSIS — M75121 Complete rotator cuff tear or rupture of right shoulder, not specified as traumatic: Secondary | ICD-10-CM | POA: Diagnosis not present

## 2016-09-30 ENCOUNTER — Ambulatory Visit (INDEPENDENT_AMBULATORY_CARE_PROVIDER_SITE_OTHER): Payer: Medicare Other | Admitting: Internal Medicine

## 2016-09-30 ENCOUNTER — Other Ambulatory Visit: Payer: Self-pay

## 2016-09-30 ENCOUNTER — Encounter: Payer: Self-pay | Admitting: Internal Medicine

## 2016-09-30 VITALS — BP 128/78 | HR 81 | Temp 97.8°F | Resp 14 | Ht 73.0 in | Wt 225.1 lb

## 2016-09-30 DIAGNOSIS — K5900 Constipation, unspecified: Secondary | ICD-10-CM | POA: Diagnosis not present

## 2016-09-30 MED ORDER — LACTULOSE 20 GM/30ML PO SOLN: 10.0000 g | Freq: Two times a day (BID) | ORAL | 1 refills | Status: DC | PRN

## 2016-09-30 NOTE — Progress Notes (Signed)
Subjective:    Patient ID: Thomas Bradley, male    DOB: 27-Jun-1946, 70 y.o.   MRN: [id number]  DOS:  09/30/2016 Type of visit - description : Acute visit, here with his wife Interval history:  About one year history of constipation, has a BM every 2-3 days. Occasionally after a BM has nausea and low abdominal pain. Also is concerned because he did a colon cancer screening kit he got from the Internet and one of the 2 tests came back positive. To have shoulder surgery in 2 or 3 days.  Wt Readings from Last 3 Encounters:  09/30/16 225 lb 2 oz (102.1 kg)  05/06/16 216 lb 8 oz (98.2 kg)  02/01/16 219 lb 6 oz (99.5 kg)    Review of Systems No blood in the stools, no weight loss. No fever chills. No mucus in the stools  Past Medical History:  Diagnosis Date  . Benign prostatic hypertrophy    ED, sees urology every year  . BPH (benign prostatic hypertrophy) with urinary obstruction    Dr. Risa Grill  . Elevated PSA    Dr. Risa Grill  . Erectile dysfunction due to arterial insufficiency    Dr. Risa Grill  . Erythema multiforme    dx 2013   . GERD (gastroesophageal reflux disease)   . Hyperlipemia   . Hypertension   . Nocturia   . Prostatitis    2010, again 09-2011    Past Surgical History:  Procedure Laterality Date  . HERNIA REPAIR  07-2010   open, R inguinal w/ mesh    Social History   Social History  . Marital status: Married    Spouse name: N/A  . Number of children: 2  . Years of education: N/A   Occupational History  . retired     Social History Main Topics  . Smoking status: Former Smoker    Types: Cigarettes    Quit date: 11/13/1976  . Smokeless tobacco: Never Used  . Alcohol use No  . Drug use: No  . Sexual activity: Not on file   Other Topics Concern  . Not on file   Social History Narrative   Has 3 acres, works in the yard a lot x 6 hours sometimes, feels well.     4 G-kids             Medication List       Accurate as of 09/30/16 11:59 PM. Always  use your most recent med list.          amLODipine 10 MG tablet Commonly known as:  NORVASC Take 1 tablet (10 mg total) by mouth daily.   aspirin 81 MG tablet Take 81 mg by mouth daily.   B-12 2500 MCG Tabs Take 1 tablet by mouth daily. Reported on 02/01/2016   Co Q-10 300 MG Caps Take 1 capsule by mouth daily. Reported on 02/01/2016   doxazosin 8 MG tablet Commonly known as:  CARDURA Take 1 tablet (8 mg total) by mouth at bedtime.   EPINEPHrine 0.3 mg/0.3 mL Devi Commonly known as:  EPIPEN Inject 0.3 mLs (0.3 mg total) into the muscle once.   Lactulose 20 GM/30ML Soln Take 15 mLs (10 g total) by mouth 2 (two) times daily as needed.   multivitamin capsule Take 1 capsule by mouth daily. Reported on 02/01/2016   polyethylene glycol packet Commonly known as:  MIRALAX / GLYCOLAX Take 17 g by mouth daily as needed for moderate constipation.   pravastatin 40 MG  tablet Commonly known as:  PRAVACHOL Take 1 tablet (40 mg total) by mouth at bedtime.   Vitamin D3 2000 units Tabs Take 1 tablet by mouth daily. Reported on 02/01/2016          Objective:   Physical Exam BP 128/78 (BP Location: Left Arm, Patient Position: Sitting, Cuff Size: Normal)   Pulse 81   Temp 97.8 F (36.6 C) (Oral)   Resp 14   Ht _0  (1.854 m)   Wt 225 lb 2 oz (102.1 kg)   SpO2 98%   BMI 29.70 kg/m  General:   Well developed, well nourished . NAD.  HEENT:  Normocephalic . Face symmetric, atraumatic Lungs:  CTA B Normal respiratory effort, no intercostal retractions, no accessory muscle use. Heart: RRR,  no murmur.  no pretibial edema bilaterally  Abdomen:  Not distended, soft, non-tender. No rebound or rigidity.  Skin: Not pale. Not jaundice Neurologic:  alert & oriented X3.  Speech normal, gait appropriate for age and unassisted Psych--  Cognition and judgment appear intact.  Cooperative with normal attention span and concentration.  Behavior appropriate. No anxious or depressed  appearing.    Assessment & Plan:    Assessment Prediabetes HTN Hyperlipidemia GERD Erythema multiforme 2013 GU: --BPH w/ elevated PSA --Prostatitis 2010, 2012 --ED Has a epi-pen -- reason? Pt not sure, had an allergy to a medicine, name?  PLAN Constipation: Constipation for one year, no blood in the stools. Last colonoscopy about a year ago normal. On chart review he takes amlodipine with good control of BP; CCBs may be a contributing factor to constipation. The plan is to switch amlodipine to losartan, refer him to GI however he is having shoulder surgery this week consequently for now we'll treat him with MiraLAX and lactulose as needed. Once he is recovering from the surgery he will call, will do the GI referral and change his BP medications.

## 2016-09-30 NOTE — Patient Instructions (Signed)
Continue with the same medications  Start taking MiraLAX 17 g mixed with fluids every day  Take lactulose (see prescription) as needed for severe constipation  If you have severe symptoms call anytime  Once the pain from the surgery is settling down, call the office: Will refer you to GI Will switch amlodipine to another BP medication because amlodipine may contribute to constipation.

## 2016-09-30 NOTE — Progress Notes (Signed)
Pre visit review using our clinic review tool, if applicable. No additional management support is needed unless otherwise documented below in the visit note. 

## 2016-10-01 NOTE — Assessment & Plan Note (Signed)
  Constipation: Constipation for one year, no blood in the stools. Last colonoscopy about a year ago normal. On chart review he takes amlodipine with good control of BP; CCBs may be a contributing factor to constipation. The plan is to switch amlodipine to losartan, refer him to GI however he is having shoulder surgery this week consequently for now we'll treat him with MiraLAX and lactulose as needed. Once he is recovering from the surgery he will call, will do the GI referral and change his BP medications.

## 2016-10-02 DIAGNOSIS — S46191D Other injury of muscle, fascia and tendon of long head of biceps, right arm, subsequent encounter: Secondary | ICD-10-CM | POA: Diagnosis not present

## 2016-10-02 DIAGNOSIS — G8918 Other acute postprocedural pain: Secondary | ICD-10-CM | POA: Diagnosis not present

## 2016-10-02 DIAGNOSIS — M24111 Other articular cartilage disorders, right shoulder: Secondary | ICD-10-CM | POA: Diagnosis not present

## 2016-10-02 DIAGNOSIS — M7541 Impingement syndrome of right shoulder: Secondary | ICD-10-CM | POA: Diagnosis not present

## 2016-10-02 DIAGNOSIS — M66821 Spontaneous rupture of other tendons, right upper arm: Secondary | ICD-10-CM | POA: Diagnosis not present

## 2016-10-02 DIAGNOSIS — M75121 Complete rotator cuff tear or rupture of right shoulder, not specified as traumatic: Secondary | ICD-10-CM | POA: Diagnosis not present

## 2016-10-08 ENCOUNTER — Telehealth: Payer: Self-pay | Admitting: Internal Medicine

## 2016-10-08 DIAGNOSIS — Z9889 Other specified postprocedural states: Secondary | ICD-10-CM | POA: Diagnosis not present

## 2016-10-08 DIAGNOSIS — K5909 Other constipation: Secondary | ICD-10-CM

## 2016-10-08 MED ORDER — LOSARTAN POTASSIUM 50 MG PO TABS: 50.0000 mg | ORAL_TABLET | Freq: Every day | ORAL | 0 refills | Status: DC

## 2016-10-08 NOTE — Telephone Encounter (Signed)
Spoke w/ Pt, GI referral placed. Instructed Pt to d/c Amlodipine and start Losartan 50 mg 1 tab daily. Lab appt scheduled 10/29/2016. Will call if consistently over 140/85.

## 2016-10-08 NOTE — Telephone Encounter (Signed)
Please advise 

## 2016-10-08 NOTE — Telephone Encounter (Signed)
Relation to [id number] Call back [place]: [place], [place] [address] [phone] (Phone) [phone] (Fax)      Reason for call:  Patient was informed to contact PCP after shoulder surgery  and request gastro referral, patient is 1 week post op. Pateint also stated PCP advised BP medication would change after surgery, patient in need of clinical advice

## 2016-10-08 NOTE — Telephone Encounter (Signed)
Enter a GI referral, DX constipation Advise patient to stop amlodipine and start losartan 50 mg one tablet daily #30, no refills. Schedule a BMP 2 weeks after he switch medications. Monitor BPs, if they go over 140/85 let me know.

## 2016-10-14 DIAGNOSIS — R1031 Right lower quadrant pain: Secondary | ICD-10-CM | POA: Diagnosis not present

## 2016-10-14 DIAGNOSIS — K5900 Constipation, unspecified: Secondary | ICD-10-CM | POA: Diagnosis not present

## 2016-10-29 ENCOUNTER — Telehealth: Payer: Self-pay | Admitting: Internal Medicine

## 2016-10-29 ENCOUNTER — Other Ambulatory Visit (INDEPENDENT_AMBULATORY_CARE_PROVIDER_SITE_OTHER): Payer: Medicare Other

## 2016-10-29 DIAGNOSIS — I10 Essential (primary) hypertension: Secondary | ICD-10-CM | POA: Diagnosis not present

## 2016-10-29 DIAGNOSIS — Z9889 Other specified postprocedural states: Secondary | ICD-10-CM | POA: Diagnosis not present

## 2016-10-29 LAB — BASIC METABOLIC PANEL
BUN: 19 mg/dL (ref 6–23)
CALCIUM: 9.6 mg/dL (ref 8.4–10.5)
CO2: 30 meq/L (ref 19–32)
Chloride: 105 mEq/L (ref 96–112)
Creatinine, Ser: 1.11 mg/dL (ref 0.40–1.50)
GFR: 69.04 mL/min (ref >60.00)
Glucose, Bld: 126 mg/dL — ABNORMAL HIGH (ref 70–99)
Potassium: 4.5 mEq/L (ref 3.5–5.1)
Sodium: 143 mEq/L (ref 135–145)

## 2016-10-29 NOTE — Telephone Encounter (Signed)
LMOM informing Pt of recommendations. Instructed him to let us know if he needs refill on Amlodipine or if he has any questions/concerns.

## 2016-10-29 NOTE — Telephone Encounter (Signed)
Advise patient: Stop losartan, re start amlodipine, same dose , send a rx if needed, monitor BPs at home  Definitely let me know if he is not going back to normal within the next 48 hours. ER if symptoms severe or  if he passes out.

## 2016-10-29 NOTE — Telephone Encounter (Signed)
Spoke w/ Pt, since starting Losartan he has been excessively cold, when asked to explain he mentions he is cold "from cranium down to my feet." He has also been experiencing low BP symptoms (dizziness to the point where he may pass out), clarification that he indeed did NOT pass out but felt very weak to the point where he thought he might. He has BP logs at home but he is currently not there. He did remember one BP which was 84/56 which was before taking Losartan, he decided not to take Losartan that day. He is requesting being switched back to Amlodipine. Also informed he will be available to speak with PCP if further questions at [phone].

## 2016-10-29 NOTE — Telephone Encounter (Signed)
Pt says that he was put on Losartan he use to take amlodipine. Pt says that it doesn't agree with him, it makes him cold and also lowering his bp. Pt would like to be advised further.    CB: [phone]

## 2016-11-17 DIAGNOSIS — H35033 Hypertensive retinopathy, bilateral: Secondary | ICD-10-CM | POA: Diagnosis not present

## 2016-11-26 DIAGNOSIS — Z9889 Other specified postprocedural states: Secondary | ICD-10-CM | POA: Diagnosis not present

## 2016-11-28 DIAGNOSIS — M75121 Complete rotator cuff tear or rupture of right shoulder, not specified as traumatic: Secondary | ICD-10-CM | POA: Diagnosis not present

## 2016-11-28 DIAGNOSIS — M25511 Pain in right shoulder: Secondary | ICD-10-CM | POA: Diagnosis not present

## 2016-12-02 DIAGNOSIS — M25511 Pain in right shoulder: Secondary | ICD-10-CM | POA: Diagnosis not present

## 2016-12-02 DIAGNOSIS — M75121 Complete rotator cuff tear or rupture of right shoulder, not specified as traumatic: Secondary | ICD-10-CM | POA: Diagnosis not present

## 2016-12-04 DIAGNOSIS — M25511 Pain in right shoulder: Secondary | ICD-10-CM | POA: Diagnosis not present

## 2016-12-04 DIAGNOSIS — M75121 Complete rotator cuff tear or rupture of right shoulder, not specified as traumatic: Secondary | ICD-10-CM | POA: Diagnosis not present

## 2016-12-09 DIAGNOSIS — M25511 Pain in right shoulder: Secondary | ICD-10-CM | POA: Diagnosis not present

## 2016-12-09 DIAGNOSIS — M75121 Complete rotator cuff tear or rupture of right shoulder, not specified as traumatic: Secondary | ICD-10-CM | POA: Diagnosis not present

## 2016-12-11 DIAGNOSIS — M75121 Complete rotator cuff tear or rupture of right shoulder, not specified as traumatic: Secondary | ICD-10-CM | POA: Diagnosis not present

## 2016-12-11 DIAGNOSIS — M25511 Pain in right shoulder: Secondary | ICD-10-CM | POA: Diagnosis not present

## 2016-12-16 DIAGNOSIS — M25511 Pain in right shoulder: Secondary | ICD-10-CM | POA: Diagnosis not present

## 2016-12-16 DIAGNOSIS — M75121 Complete rotator cuff tear or rupture of right shoulder, not specified as traumatic: Secondary | ICD-10-CM | POA: Diagnosis not present

## 2016-12-18 DIAGNOSIS — M25511 Pain in right shoulder: Secondary | ICD-10-CM | POA: Diagnosis not present

## 2016-12-18 DIAGNOSIS — M75121 Complete rotator cuff tear or rupture of right shoulder, not specified as traumatic: Secondary | ICD-10-CM | POA: Diagnosis not present

## 2016-12-22 DIAGNOSIS — M25511 Pain in right shoulder: Secondary | ICD-10-CM | POA: Diagnosis not present

## 2016-12-22 DIAGNOSIS — M75121 Complete rotator cuff tear or rupture of right shoulder, not specified as traumatic: Secondary | ICD-10-CM | POA: Diagnosis not present

## 2016-12-24 DIAGNOSIS — M25511 Pain in right shoulder: Secondary | ICD-10-CM | POA: Diagnosis not present

## 2016-12-24 DIAGNOSIS — M75121 Complete rotator cuff tear or rupture of right shoulder, not specified as traumatic: Secondary | ICD-10-CM | POA: Diagnosis not present

## 2016-12-29 DIAGNOSIS — M25511 Pain in right shoulder: Secondary | ICD-10-CM | POA: Diagnosis not present

## 2016-12-29 DIAGNOSIS — M75121 Complete rotator cuff tear or rupture of right shoulder, not specified as traumatic: Secondary | ICD-10-CM | POA: Diagnosis not present

## 2016-12-31 DIAGNOSIS — M25511 Pain in right shoulder: Secondary | ICD-10-CM | POA: Diagnosis not present

## 2016-12-31 DIAGNOSIS — M75121 Complete rotator cuff tear or rupture of right shoulder, not specified as traumatic: Secondary | ICD-10-CM | POA: Diagnosis not present

## 2017-01-05 DIAGNOSIS — M75121 Complete rotator cuff tear or rupture of right shoulder, not specified as traumatic: Secondary | ICD-10-CM | POA: Diagnosis not present

## 2017-01-05 DIAGNOSIS — M25511 Pain in right shoulder: Secondary | ICD-10-CM | POA: Diagnosis not present

## 2017-01-07 DIAGNOSIS — Z09 Encounter for follow-up examination after completed treatment for conditions other than malignant neoplasm: Secondary | ICD-10-CM | POA: Diagnosis not present

## 2017-01-07 DIAGNOSIS — M75121 Complete rotator cuff tear or rupture of right shoulder, not specified as traumatic: Secondary | ICD-10-CM | POA: Diagnosis not present

## 2017-01-07 DIAGNOSIS — M25511 Pain in right shoulder: Secondary | ICD-10-CM | POA: Diagnosis not present

## 2017-02-18 DIAGNOSIS — M25511 Pain in right shoulder: Secondary | ICD-10-CM | POA: Diagnosis not present

## 2017-05-08 ENCOUNTER — Ambulatory Visit (INDEPENDENT_AMBULATORY_CARE_PROVIDER_SITE_OTHER): Payer: Medicare Other | Admitting: Internal Medicine

## 2017-05-08 ENCOUNTER — Encounter: Payer: Self-pay | Admitting: Internal Medicine

## 2017-05-08 VITALS — BP 122/74 | HR 73 | Temp 97.7°F | Ht 73.0 in | Wt 222.0 lb

## 2017-05-08 DIAGNOSIS — I10 Essential (primary) hypertension: Secondary | ICD-10-CM | POA: Diagnosis not present

## 2017-05-08 DIAGNOSIS — E785 Hyperlipidemia, unspecified: Secondary | ICD-10-CM | POA: Diagnosis not present

## 2017-05-08 DIAGNOSIS — R972 Elevated prostate specific antigen [PSA]: Secondary | ICD-10-CM | POA: Diagnosis not present

## 2017-05-08 DIAGNOSIS — K5909 Other constipation: Secondary | ICD-10-CM | POA: Diagnosis not present

## 2017-05-08 DIAGNOSIS — R739 Hyperglycemia, unspecified: Secondary | ICD-10-CM | POA: Diagnosis not present

## 2017-05-08 LAB — LIPID PANEL
CHOL/HDL RATIO: 4
CHOLESTEROL: 162 mg/dL (ref 0–200)
HDL: 40.4 mg/dL (ref >39.00)
LDL CALC: 98 mg/dL (ref 0–99)
NonHDL: 121.54
TRIGLYCERIDES: 118 mg/dL (ref 0.0–149.0)
VLDL: 23.6 mg/dL (ref 0.0–40.0)

## 2017-05-08 LAB — CBC WITH DIFFERENTIAL/PLATELET
Basophils Absolute: 0 10*3/uL (ref 0.0–0.1)
Basophils Relative: 0.5 % (ref 0.0–3.0)
Eosinophils Absolute: 0.1 10*3/uL (ref 0.0–0.7)
Eosinophils Relative: 2.1 % (ref 0.0–5.0)
HEMATOCRIT: 44.9 % (ref 39.0–52.0)
HEMOGLOBIN: 15.1 g/dL (ref 13.0–17.0)
LYMPHS PCT: 23 % (ref 12.0–46.0)
Lymphs Abs: 1.1 10*3/uL (ref 0.7–4.0)
MCHC: 33.6 g/dL (ref 30.0–36.0)
MCV: 90.1 fl (ref 78.0–100.0)
MONOS PCT: 10.2 % (ref 3.0–12.0)
Monocytes Absolute: 0.5 10*3/uL (ref 0.1–1.0)
Neutro Abs: 3.1 10*3/uL (ref 1.4–7.7)
Neutrophils Relative %: 64.2 % (ref 43.0–77.0)
Platelets: 165 10*3/uL (ref 150.0–400.0)
RBC: 4.98 Mil/uL (ref 4.22–5.81)
RDW: 15 % (ref 11.5–15.5)
WBC: 4.9 10*3/uL (ref 4.0–10.5)

## 2017-05-08 LAB — AST: AST: 13 U/L (ref 0–37)

## 2017-05-08 LAB — HEMOGLOBIN A1C: Hgb A1c MFr Bld: 6.4 % (ref 4.6–6.5)

## 2017-05-08 LAB — PSA: PSA: 3.94 ng/mL (ref 0.10–4.00)

## 2017-05-08 LAB — ALT: ALT: 14 U/L (ref 0–53)

## 2017-05-08 NOTE — Progress Notes (Signed)
Subjective:    Patient ID: Thomas Bradley, male    DOB: 17-Dec-1945, 71 y.o.   MRN: [id number]  DOS:  05/08/2017 Type of visit - description : Follow-up Interval history: Constipation: Improved, GI note reviewed High cholesterol: Good med compliance . No apparent side effects HTN: Good med compliance, ambulatory BPs around 109/60. Slightly low, he is asymptomatic BPH, L UTS: Nocturia has actually decreased, one time at night, doing well.   Review of Systems Denies chest pain or difficulty breathing No nausea, vomiting, diarrhea No dysuria or gross hematuria  Past Medical History:  Diagnosis Date  . Benign prostatic hypertrophy    ED, sees urology every year  . BPH (benign prostatic hypertrophy) with urinary obstruction    Dr. Risa Grill  . Elevated PSA    Dr. Risa Grill  . Erectile dysfunction due to arterial insufficiency    Dr. Risa Grill  . Erythema multiforme    dx 2013   . GERD (gastroesophageal reflux disease)   . Hyperlipemia   . Hypertension   . Nocturia   . Prostatitis    2010, again 09-2011    Past Surgical History:  Procedure Laterality Date  . HERNIA REPAIR  07-2010   open, R inguinal w/ mesh    Social History   Social History  . Marital status: Married    Spouse name: N/A  . Number of children: 2  . Years of education: N/A   Occupational History  . retired     Social History Main Topics  . Smoking status: Former Smoker    Types: Cigarettes    Quit date: 11/13/1976  . Smokeless tobacco: Never Used  . Alcohol use No  . Drug use: No  . Sexual activity: Not on file   Other Topics Concern  . Not on file   Social History Narrative   Has 3 acres, works in the yard a lot x 6 hours sometimes, feels well.     4 G-kids           Allergies as of 05/08/2017   No Known Allergies     Medication List       Accurate as of 05/08/17 11:59 PM. Always use your most recent med list.          amLODipine 10 MG tablet Commonly known as:  NORVASC Take 1  tablet (10 mg total) by mouth daily.   aspirin 81 MG tablet Take 81 mg by mouth daily.   B-12 2500 MCG Tabs Take 1 tablet by mouth daily. Reported on 02/01/2016   Co Q-10 300 MG Caps Take 1 capsule by mouth daily. Reported on 02/01/2016   doxazosin 8 MG tablet Commonly known as:  CARDURA Take 1 tablet (8 mg total) by mouth at bedtime.   EPINEPHrine 0.3 mg/0.3 mL Devi Commonly known as:  EPIPEN Inject 0.3 mLs (0.3 mg total) into the muscle once.   multivitamin capsule Take 1 capsule by mouth daily. Reported on 02/01/2016   polyethylene glycol packet Commonly known as:  MIRALAX / GLYCOLAX Take 17 g by mouth daily as needed for moderate constipation.   pravastatin 40 MG tablet Commonly known as:  PRAVACHOL Take 1 tablet (40 mg total) by mouth at bedtime.   Vitamin D3 2000 units Tabs Take 1 tablet by mouth daily. Reported on 02/01/2016          Objective:   Physical Exam BP 122/74 (BP Location: Left Arm, Patient Position: Sitting, Cuff Size: Normal)   Pulse 73  Temp 97.7 F (36.5 C) (Oral)   Ht 6\' 1"  (1.854 m)   Wt 222 lb (100.7 kg)   SpO2 99%   BMI 29.29 kg/m  General:   Well developed, well nourished . NAD.  HEENT:  Normocephalic . Face symmetric, atraumatic Lungs:  CTA B Normal respiratory effort, no intercostal retractions, no accessory muscle use. Heart: RRR,  no murmur.  no pretibial edema bilaterally  Abdomen:  Not distended, soft, non-tender. No rebound or rigidity.   DRE: : Right side of the prostate is larger and slightly more firm. Not nodular or tender. Skin: Not pale. Not jaundice Neurologic:  alert & oriented X3.  Speech normal, gait appropriate for age and unassisted Psych--  Cognition and judgment appear intact.  Cooperative with normal attention span and concentration.  Behavior appropriate. No anxious or depressed appearing.     Assessment & Plan:    Assessment Prediabetes HTN (intol to losartan, 2018) Hyperlipidemia GERD Erythema  multiforme 2013 GU: --BPH w/ elevated PSA, asymmetric prostate --Prostatitis 2010, 2012 --ED Has a epi-pen -- reason? Pt not sure, had an allergy to a medicine, name?  PLAN Prediabetes: Due for A1c HTN: Since last visit, we tried to stop amlodipine due to constipation, he went on losartan but couldn't tolerate it (felt very cold), so is back on amlodipine; also takes Cardura,  seem well controlled. Recent BMP satisfactory. Check a CBC. Constipation: Since the last time, saw GI, was reassured , since then sxs actually resolved without any major intervention. Has MiraLAX and lactulose, recommend to use prn. BPH, elevated PSA: Exam today show asymmetric prostate, similar to 2016. Was seen by urology 11-2015 , pt understood that he was to return PRN. Currently with minimal symptoms.. Plan: get results from last PSA from urology, check a PSA. RTC 6 months

## 2017-05-08 NOTE — Patient Instructions (Signed)
GO TO THE LAB : Get the blood work     GO TO THE FRONT DESK Schedule your next appointment for a  routine checkup in 6 months  Consider a medical wellness visit with one of our nurses

## 2017-05-08 NOTE — Progress Notes (Signed)
Pre visit review using our clinic review tool, if applicable. No additional management support is needed unless otherwise documented below in the visit note. 

## 2017-05-09 NOTE — Assessment & Plan Note (Signed)
Prediabetes: Due for A1c HTN: Since last visit, we tried to stop amlodipine due to constipation, he went on losartan but couldn't tolerate it (felt very cold), so is back on amlodipine; also takes Cardura,  seem well controlled. Recent BMP satisfactory. Check a CBC. Constipation: Since the last time, saw GI, was reassured , since then sxs actually resolved without any major intervention. Has MiraLAX and lactulose, recommend to use prn. BPH, elevated PSA: Exam today show asymmetric prostate, similar to 2016. Was seen by urology 11-2015 , pt understood that he was to return PRN. Currently with minimal symptoms.. Plan: get results from last PSA from urology, check a PSA. RTC 6 months

## 2017-05-12 ENCOUNTER — Encounter: Payer: Self-pay | Admitting: Internal Medicine

## 2017-07-07 ENCOUNTER — Other Ambulatory Visit: Payer: Self-pay | Admitting: Internal Medicine

## 2017-07-21 DIAGNOSIS — Z23 Encounter for immunization: Secondary | ICD-10-CM | POA: Diagnosis not present

## 2017-07-30 DIAGNOSIS — N390 Urinary tract infection, site not specified: Secondary | ICD-10-CM | POA: Diagnosis not present

## 2017-08-03 DIAGNOSIS — N390 Urinary tract infection, site not specified: Secondary | ICD-10-CM | POA: Diagnosis not present

## 2017-08-10 ENCOUNTER — Other Ambulatory Visit: Payer: Self-pay | Admitting: Internal Medicine

## 2017-08-12 DIAGNOSIS — N3000 Acute cystitis without hematuria: Secondary | ICD-10-CM | POA: Diagnosis not present

## 2017-11-05 NOTE — Progress Notes (Signed)
Dictation #1 [id number]  [id number]   Subjective:   Thomas Bradley is a 72 y.o. male who presents for Medicare Annual/Subsequent preventive examination.  The Patient was informed that the wellness visit is to identify future health risk and educate and initiate measures that can reduce risk for increased disease through the lifespan.   Describes health as fair, good or great? Great.  Review of Systems:  No ROS.  Medicare Wellness Visit. Additional risk factors are reflected in the social history. Cardiac Risk Factors include: advanced age (>69men, >12 women);dyslipidemia;hypertension;male gender Sleep patterns:  Pt states he sleeps at least 7-8 hrs per night. Home Safety/Smoke Alarms: Feels safe in home. Smoke alarms in place. Live with wife in 1 story home.   Male:   CCS- last 07/04/15: Recall 5 yrs per pt     PSA-  Lab Results  Component Value Date   PSA 3.94 05/08/2017   PSA 2.17 11/30/2015   PSA 4.59 (H) 05/01/2015       Objective:    Vitals: BP 128/76 (BP Location: Left Arm, Patient Position: Sitting, Cuff Size: Normal)   Pulse 78   Temp 97.8 F (36.6 C) (Oral)   Resp 14   Ht 6\' 1"  (1.854 m)   Wt 225 lb 4 oz (102.2 kg)   SpO2 97%   BMI 29.72 kg/m   Body mass index is 29.72 kg/m.  Advanced Directives 11/09/2017  Does Patient Have a Medical Advance Directive? No  Would patient like information on creating a medical advance directive? No - Patient declined    Tobacco Social History   Tobacco Use  Smoking Status Former Smoker  . Types: Cigarettes  . Last attempt to quit: 11/13/1976  . Years since quitting: 41.0  Smokeless Tobacco Never Used     Counseling given: No   Clinical Intake: Pain : No/denies pain  Past Medical History:  Diagnosis Date  . Benign prostatic hypertrophy    ED, sees urology every year  . BPH (benign prostatic hypertrophy) with urinary obstruction    Dr. Risa Grill  . Elevated PSA    Dr. Risa Grill  . Erectile dysfunction due to  arterial insufficiency    Dr. Risa Grill  . Erythema multiforme    dx 2013   . GERD (gastroesophageal reflux disease)   . Hyperlipemia   . Hypertension   . Nocturia   . Prostatitis    2010, again 09-2011   Past Surgical History:  Procedure Laterality Date  . HERNIA REPAIR  07-2010   open, R inguinal w/ mesh   Family History  Problem Relation Age of Onset  . Coronary artery disease Father 26  . Hypertension Sister   . Diabetes Brother   . Stroke Neg Hx   . Colon cancer Neg Hx   . Prostate cancer Neg Hx    Social History   Socioeconomic History  . Marital status: Married    Spouse name: None  . Number of children: 2  . Years of education: None  . Highest education level: None  Social Needs  . Financial resource strain: None  . Food insecurity - worry: None  . Food insecurity - inability: None  . Transportation needs - medical: None  . Transportation needs - non-medical: None  Occupational History  . Occupation: retired   Tobacco Use  . Smoking status: Former Smoker    Types: Cigarettes    Last attempt to quit: 11/13/1976    Years since quitting: 41.0  . Smokeless tobacco: Never Used  Substance and Sexual Activity  . Alcohol use: No  . Drug use: No  . Sexual activity: No  Other Topics Concern  . None  Social History Narrative   Has 3 acres, works in the yard a lot x 6 hours sometimes, feels well.     4 G-kids         Outpatient Encounter Medications as of 11/09/2017  Medication Sig  . amLODipine (NORVASC) 10 MG tablet Take 1 tablet (10 mg total) by mouth daily.  Marland Kitchen aspirin 81 MG tablet Take 81 mg by mouth daily.  . Cholecalciferol (VITAMIN D3) 2000 UNITS TABS Take 1 tablet by mouth daily. Reported on 02/01/2016  . Cyanocobalamin (B-12) 2500 MCG TABS Take 1 tablet by mouth daily. Reported on 02/01/2016  . doxazosin (CARDURA) 8 MG tablet Take 1 tablet (8 mg total) by mouth at bedtime.  . polyethylene glycol (MIRALAX / GLYCOLAX) packet Take 17 g by mouth daily as  needed for moderate constipation.  . pravastatin (PRAVACHOL) 40 MG tablet Take 1 tablet (40 mg total) by mouth at bedtime.  . Coenzyme Q10 (CO Q-10) 300 MG CAPS Take 1 capsule by mouth daily. Reported on 02/01/2016  . EPINEPHrine (EPIPEN) 0.3 mg/0.3 mL DEVI Inject 0.3 mLs (0.3 mg total) into the muscle once. (Patient not taking: Reported on 09/30/2016)  . Multiple Vitamin (MULTIVITAMIN) capsule Take 1 capsule by mouth daily. Reported on 02/01/2016   No facility-administered encounter medications on file as of 11/09/2017.     Activities of Daily Living In your present state of health, do you have any difficulty performing the following activities: 11/09/2017 11/09/2017  Hearing? N N  Vision? N N  Comment Dr.Therman twice per yr. -  Difficulty concentrating or making decisions? N N  Walking or climbing stairs? N N  Dressing or bathing? N N  Doing errands, shopping? N N  Preparing Food and eating ? N -  Using the Toilet? N -  In the past six months, have you accidently leaked urine? N -  Do you have problems with loss of bowel control? N -  Managing your Medications? N -  Managing your Finances? N -  Housekeeping or managing your Housekeeping? N -  Some recent data might be hidden    Patient Care Team: Colon Branch, MD as PCP - General Rana Snare, MD as Consulting Physician (Urology)   Assessment:   This is a routine wellness examination for Thomas Bradley. Physical assessment deferred to PCP.  Exercise Activities and Dietary recommendations Current Exercise Habits: The patient does not participate in regular exercise at present, Exercise limited by: None identified Diet (meal preparation, eat out, water intake, caffeinated beverages, dairy products, fruits and vegetables): in general, a "healthy" diet  , well balanced  Goals    . Maintain current health (pt-stated)       Fall Risk Fall Risk  11/09/2017 11/09/2017 05/06/2016 11/02/2015 05/01/2015  Falls in the past year? No No No No No     Depression Screen PHQ 2/9 Scores 11/09/2017 11/09/2017 05/06/2016 11/02/2015  PHQ - 2 Score 0 0 0 0    Cognitive Function MMSE - Mini Mental State Exam 11/09/2017  Orientation to time 5  Orientation to Place 5  Registration 3  Attention/ Calculation 5  Recall 2  Language- name 2 objects 2  Language- repeat 1  Language- follow 3 step command 3  Language- read & follow direction 1  Write a sentence 1  Copy design 1  Total score 29  Immunization History  Administered Date(s) Administered  . Influenza, High Dose Seasonal PF 07/27/2013  . Influenza,inj,Quad PF,6+ Mos 08/02/2015  . Influenza-Unspecified 08/14/2016, 07/27/2017  . Pneumococcal Conjugate-13 04/27/2014  . Pneumococcal Polysaccharide-23 01/23/2009  . Tdap 01/28/2011, 07/25/2015  . Zoster 07/22/2010    Screening Tests Health Maintenance  Topic Date Due  . Hepatitis C Screening  Aug 30, 1946  . COLONOSCOPY  07/03/2020  . TETANUS/TDAP  07/24/2025  . INFLUENZA VACCINE  Completed  . PNA vac Low Risk Adult  Completed    Plan:   Follow up with Dr.Paz as directed  Continue to eat heart healthy diet (full of fruits, vegetables, whole grains, lean protein, water--limit salt, fat, and sugar intake) and increase physical activity as tolerated.  Continue doing brain stimulating activities (puzzles, reading, adult coloring books, staying active) to keep memory sharp.    I have personally reviewed and noted the following in the patient's chart:   . Medical and social history . Use of alcohol, tobacco or illicit drugs  . Current medications and supplements . Functional ability and status . Nutritional status . Physical activity . Advanced directives . List of other physicians . Hospitalizations, surgeries, and ER visits in previous 12 months . Vitals . Screenings to include cognitive, depression, and falls . Referrals and appointments  In addition, I have reviewed and discussed with patient certain  preventive protocols, quality metrics, and best practice recommendations. A written personalized care plan for preventive services as well as general preventive health recommendations were provided to patient.     Naaman Plummer [place], [place]  11/09/2017  Kathlene November, MD

## 2017-11-09 ENCOUNTER — Encounter: Payer: Self-pay | Admitting: Internal Medicine

## 2017-11-09 ENCOUNTER — Ambulatory Visit (INDEPENDENT_AMBULATORY_CARE_PROVIDER_SITE_OTHER): Payer: Medicare Other | Admitting: Internal Medicine

## 2017-11-09 VITALS — BP 128/76 | HR 78 | Temp 97.8°F | Resp 14 | Ht 73.0 in | Wt 225.2 lb

## 2017-11-09 DIAGNOSIS — E785 Hyperlipidemia, unspecified: Secondary | ICD-10-CM

## 2017-11-09 DIAGNOSIS — Z1159 Encounter for screening for other viral diseases: Secondary | ICD-10-CM

## 2017-11-09 DIAGNOSIS — R739 Hyperglycemia, unspecified: Secondary | ICD-10-CM

## 2017-11-09 DIAGNOSIS — N400 Enlarged prostate without lower urinary tract symptoms: Secondary | ICD-10-CM | POA: Diagnosis not present

## 2017-11-09 DIAGNOSIS — Z0000 Encounter for general adult medical examination without abnormal findings: Secondary | ICD-10-CM

## 2017-11-09 DIAGNOSIS — I10 Essential (primary) hypertension: Secondary | ICD-10-CM | POA: Diagnosis not present

## 2017-11-09 LAB — HEMOGLOBIN A1C: Hgb A1c MFr Bld: 6.2 % (ref 4.6–6.5)

## 2017-11-09 LAB — BASIC METABOLIC PANEL
BUN: 20 mg/dL (ref 6–23)
CO2: 28 meq/L (ref 19–32)
Calcium: 9.6 mg/dL (ref 8.4–10.5)
Chloride: 108 mEq/L (ref 96–112)
Creatinine, Ser: 1.02 mg/dL (ref 0.40–1.50)
GFR: 75.9 mL/min (ref >60.00)
Glucose, Bld: 117 mg/dL — ABNORMAL HIGH (ref 70–99)
POTASSIUM: 4.6 meq/L (ref 3.5–5.1)
SODIUM: 143 meq/L (ref 135–145)

## 2017-11-09 NOTE — Assessment & Plan Note (Addendum)
-  Td  01-2011;  pneumonia shot 2010;  prevnar-- 2015;  shingles shot 07-22-10; shingrix discussed  ; had a flu shot  -HTV:GVSYVGCYOYO 2004,Dr Santogade, colonoscopy again 05-2010 -Dr Michail Sermon-, hyperplastic polyps, cscope 06-2015: no polyps, 5 years per report -Prostate cancer screening: 04/2017:  DRE  symmetric, PSA 3.9 -Has a  healthy life style -Labs:   BMP, A1c, hep C.

## 2017-11-09 NOTE — Patient Instructions (Addendum)
GO TO THE LAB : Get the blood work     GO TO THE FRONT DESK Schedule your next appointment for a  Check up in 6-8 months    Thomas Bradley , Thank you for taking time to come for your Medicare Wellness Visit. I appreciate your ongoing commitment to your health goals. Please review the following plan we discussed and let me know if I can assist you in the future.   These are the goals we discussed: Goals    . Maintain current health (pt-stated)       This is a list of the screening recommended for you and due dates:  Health Maintenance  Topic Date Due  .  Hepatitis C: One time screening is recommended by Center for Disease Control  (CDC) for  adults born from [birth year] through 1967.   1945-12-04  . Colon Cancer Screening  07/03/2020  . Tetanus Vaccine  07/24/2025  . Flu Shot  Completed  . Pneumonia vaccines  Completed    Health Maintenance, Male A healthy lifestyle and preventive care is important for your health and wellness. Ask your health care provider about what schedule of regular examinations is right for you. What should I know about weight and diet? Eat a Healthy Diet  Eat plenty of vegetables, fruits, whole grains, low-fat dairy products, and lean protein.  Do not eat a lot of foods high in solid fats, added sugars, or salt.  Maintain a Healthy Weight Regular exercise can help you achieve or maintain a healthy weight. You should:  Do at least 150 minutes of exercise each week. The exercise should increase your heart rate and make you sweat (moderate-intensity exercise).  Do strength-training exercises at least twice a week.  Watch Your Levels of Cholesterol and Blood Lipids  Have your blood tested for lipids and cholesterol every 5 years starting at 74 years of age. If you are at high risk for heart disease, you should start having your blood tested when you are 74 years old. You may need to have your cholesterol levels checked more often if: ? Your lipid or cholesterol  levels are high. ? You are older than 74 years of age. ? You are at high risk for heart disease.  What should I know about cancer screening? Many types of cancers can be detected early and may often be prevented. Lung Cancer  You should be screened every year for lung cancer if: ? You are a current smoker who has smoked for at least 30 years. ? You are a former smoker who has quit within the past 15 years.  Talk to your health care provider about your screening options, when you should start screening, and how often you should be screened.  Colorectal Cancer  Routine colorectal cancer screening usually begins at 74 years of age and should be repeated every 5-10 years until you are 74 years old. You may need to be screened more often if early forms of precancerous polyps or small growths are found. Your health care provider may recommend screening at an earlier age if you have risk factors for colon cancer.  Your health care provider may recommend using home test kits to check for hidden blood in the stool.  A small camera at the end of a tube can be used to examine your colon (sigmoidoscopy or colonoscopy). This checks for the earliest forms of colorectal cancer.  Prostate and Testicular Cancer  Depending on your age and overall health, your health  care provider may do certain tests to screen for prostate and testicular cancer.  Talk to your health care provider about any symptoms or concerns you have about testicular or prostate cancer.  Skin Cancer  Check your skin from head to toe regularly.  Tell your health care provider about any new moles or changes in moles, especially if: ? There is a change in a mole's size, shape, or color. ? You have a mole that is larger than a pencil eraser.  Always use sunscreen. Apply sunscreen liberally and repeat throughout the day.  Protect yourself by wearing long sleeves, pants, a wide-brimmed hat, and sunglasses when outside.  What should  I know about heart disease, diabetes, and high blood pressure?  If you are 14-93 years of age, have your blood pressure checked every 3-5 years. If you are 61 years of age or older, have your blood pressure checked every year. You should have your blood pressure measured twice-once when you are at a hospital or clinic, and once when you are not at a hospital or clinic. Record the average of the two measurements. To check your blood pressure when you are not at a hospital or clinic, you can use: ? An automated blood pressure machine at a pharmacy. ? A home blood pressure monitor.  Talk to your health care provider about your target blood pressure.  If you are between 80-36 years old, ask your health care provider if you should take aspirin to prevent heart disease.  Have regular diabetes screenings by checking your fasting blood sugar level. ? If you are at a normal weight and have a low risk for diabetes, have this test once every three years after the age of 75. ? If you are overweight and have a high risk for diabetes, consider being tested at a younger age or more often.  A one-time screening for abdominal aortic aneurysm (AAA) by ultrasound is recommended for men aged 45-75 years who are current or former smokers. What should I know about preventing infection? Hepatitis B If you have a higher risk for hepatitis B, you should be screened for this virus. Talk with your health care provider to find out if you are at risk for hepatitis B infection. Hepatitis C Blood testing is recommended for:  Everyone born from [birth year] through 1967.  Anyone with known risk factors for hepatitis C.  Sexually Transmitted Diseases (STDs)  You should be screened each year for STDs including gonorrhea and chlamydia if: ? You are sexually active and are younger than 74 years of age. ? You are older than 74 years of age and your health care provider tells you that you are at risk for this type of  infection. ? Your sexual activity has changed since you were last screened and you are at an increased risk for chlamydia or gonorrhea. Ask your health care provider if you are at risk.  Talk with your health care provider about whether you are at high risk of being infected with HIV. Your health care provider may recommend a prescription medicine to help prevent HIV infection.  What else can I do?  Schedule regular health, dental, and eye exams.  Stay current with your vaccines (immunizations).  Do not use any tobacco products, such as cigarettes, chewing tobacco, and e-cigarettes. If you need help quitting, ask your health care provider.  Limit alcohol intake to no more than 2 drinks per day. One drink equals 12 ounces of beer, 5 ounces of wine,  or 1 ounces of hard liquor.  Do not use street drugs.  Do not share needles.  Ask your health care provider for help if you need support or information about quitting drugs.  Tell your health care provider if you often feel depressed.  Tell your health care provider if you have ever been abused or do not feel safe at home. This information is not intended to replace advice given to you by your health care provider. Make sure you discuss any questions you have with your health care provider. Document Released: 04/10/2008 Document Revised: 06/11/2016 Document Reviewed: 07/17/2015 Elsevier Interactive Patient Education  [employer].

## 2017-11-09 NOTE — Progress Notes (Signed)
Subjective:    Patient ID: Thomas Bradley, male    DOB: 04-10-1946, 72 y.o.   MRN: [id number]  DOS:  11/09/2017 Type of visit - description : rov Interval history: High chol: good compliance w/ meds, no apparent side effects HTN: Good compliance with medications, no recent ambulatory BPs BPH: Currently asymptomatic, doing great.  No nocturia.  No other symptoms.  Review of Systems  Denies chest pain or difficulty breathing No nausea, vomiting, diarrhea No constipation.  Past Medical History:  Diagnosis Date  . Benign prostatic hypertrophy    ED, sees urology every year  . BPH (benign prostatic hypertrophy) with urinary obstruction    Dr. Risa Grill  . Elevated PSA    Dr. Risa Grill  . Erectile dysfunction due to arterial insufficiency    Dr. Risa Grill  . Erythema multiforme    dx 2013   . GERD (gastroesophageal reflux disease)   . Hyperlipemia   . Hypertension   . Nocturia   . Prostatitis    2010, again 09-2011    Past Surgical History:  Procedure Laterality Date  . HERNIA REPAIR  07-2010   open, R inguinal w/ mesh    Social History   Socioeconomic History  . Marital status: Married    Spouse name: Not on file  . Number of children: 2  . Years of education: Not on file  . Highest education level: Not on file  Social Needs  . Financial resource strain: Not on file  . Food insecurity - worry: Not on file  . Food insecurity - inability: Not on file  . Transportation needs - medical: Not on file  . Transportation needs - non-medical: Not on file  Occupational History  . Occupation: retired   Tobacco Use  . Smoking status: Former Smoker    Types: Cigarettes    Last attempt to quit: 11/13/1976    Years since quitting: 41.0  . Smokeless tobacco: Never Used  Substance and Sexual Activity  . Alcohol use: No  . Drug use: No  . Sexual activity: Not on file  Other Topics Concern  . Not on file  Social History Narrative   Has 3 acres, works in the yard a lot x 6 hours  sometimes, feels well.     4 G-kids           Allergies as of 11/09/2017   No Known Allergies     Medication List        Accurate as of 11/09/17  8:30 AM. Always use your most recent med list.          amLODipine 10 MG tablet Commonly known as:  NORVASC Take 1 tablet (10 mg total) by mouth daily.   aspirin 81 MG tablet Take 81 mg by mouth daily.   B-12 2500 MCG Tabs Take 1 tablet by mouth daily. Reported on 02/01/2016   Co Q-10 300 MG Caps Take 1 capsule by mouth daily. Reported on 02/01/2016   doxazosin 8 MG tablet Commonly known as:  CARDURA Take 1 tablet (8 mg total) by mouth at bedtime.   EPINEPHrine 0.3 mg/0.3 mL Devi Commonly known as:  EPIPEN Inject 0.3 mLs (0.3 mg total) into the muscle once.   multivitamin capsule Take 1 capsule by mouth daily. Reported on 02/01/2016   polyethylene glycol packet Commonly known as:  MIRALAX / GLYCOLAX Take 17 g by mouth daily as needed for moderate constipation.   pravastatin 40 MG tablet Commonly known as:  PRAVACHOL Take  1 tablet (40 mg total) by mouth at bedtime.   Vitamin D3 2000 units Tabs Take 1 tablet by mouth daily. Reported on 02/01/2016          Objective:   Physical Exam BP 128/76 (BP Location: Left Arm, Patient Position: Sitting, Cuff Size: Normal)   Pulse 78   Temp 97.8 F (36.6 C) (Oral)   Resp 14   Ht 6\' 1"  (1.854 m)   Wt 225 lb 4 oz (102.2 kg)   SpO2 97%   BMI 29.72 kg/m  General:   Well developed, well nourished . NAD.  Neck: No  thyromegaly  HEENT:  Normocephalic . Face symmetric, atraumatic Lungs:  CTA B Normal respiratory effort, no intercostal retractions, no accessory muscle use. Heart: RRR,  no murmur.  No pretibial edema bilaterally  Abdomen:  Not distended, soft, non-tender. No rebound or rigidity.   Skin: Exposed areas without rash. Not pale. Not jaundice Neurologic:  alert & oriented X3.  Speech normal, gait appropriate for age and unassisted Strength symmetric and  appropriate for age.  Psych: Cognition and judgment appear intact.  Cooperative with normal attention span and concentration.  Behavior appropriate. No anxious or depressed appearing.     Assessment & Plan:   Assessment Prediabetes HTN (intol to losartan, 2018) Hyperlipidemia GERD Erythema multiforme 2013 GU: --BPH w/ elevated PSA, asymmetric prostate --Prostatitis 2010, 2012 --ED Has a epi-pen -- reason? Pt not sure, had an allergy to a medicine, name?  PLAN: Prediabetes: Doing well with diet he remains very active.  Concept of A1c discussed, check labs. HTN: Currently on amlodipine, Cardura.  No recent ambulatory BPs but BP today is very good.  No change, check a BMP Constipation:  no further sxs since taking fiber supplements  High chol:  On Pravachol, last FLP very good BPH: asx , no nocturia at all. Primary care issues discussed RTC 6-8 months, fasting

## 2017-11-10 LAB — HEPATITIS C ANTIBODY
HEP C AB: NONREACTIVE
SIGNAL TO CUT-OFF: 0.02 (ref <1.00)

## 2017-11-10 NOTE — Assessment & Plan Note (Signed)
Prediabetes: Doing well with diet he remains very active.  Concept of A1c discussed, check labs. HTN: Currently on amlodipine, Cardura.  No recent ambulatory BPs but BP today is very good.  No change, check a BMP Constipation:  no further sxs since taking fiber supplements  High chol:  On Pravachol, last FLP very good BPH: asx , no nocturia at all. Primary care issues discussed RTC 6-8 months, fasting

## 2017-12-15 ENCOUNTER — Other Ambulatory Visit: Payer: Self-pay | Admitting: Internal Medicine

## 2018-04-05 ENCOUNTER — Other Ambulatory Visit: Payer: Self-pay | Admitting: Internal Medicine

## 2018-06-29 ENCOUNTER — Ambulatory Visit (INDEPENDENT_AMBULATORY_CARE_PROVIDER_SITE_OTHER): Payer: Medicare Other | Admitting: Internal Medicine

## 2018-06-29 VITALS — BP 124/82 | HR 73 | Temp 97.8°F | Resp 18 | Ht 73.0 in | Wt 217.0 lb

## 2018-06-29 DIAGNOSIS — N400 Enlarged prostate without lower urinary tract symptoms: Secondary | ICD-10-CM

## 2018-06-29 DIAGNOSIS — R739 Hyperglycemia, unspecified: Secondary | ICD-10-CM | POA: Diagnosis not present

## 2018-06-29 DIAGNOSIS — I10 Essential (primary) hypertension: Secondary | ICD-10-CM

## 2018-06-29 DIAGNOSIS — E785 Hyperlipidemia, unspecified: Secondary | ICD-10-CM

## 2018-06-29 LAB — LIPID PANEL
CHOL/HDL RATIO: 4
Cholesterol: 149 mg/dL (ref 0–200)
HDL: 39 mg/dL — AB (ref >39.00)
LDL CALC: 89 mg/dL (ref 0–99)
NONHDL: 109.51
TRIGLYCERIDES: 102 mg/dL (ref 0.0–149.0)
VLDL: 20.4 mg/dL (ref 0.0–40.0)

## 2018-06-29 LAB — CBC WITH DIFFERENTIAL/PLATELET
Basophils Absolute: 0 10*3/uL (ref 0.0–0.1)
Basophils Relative: 0.6 % (ref 0.0–3.0)
Eosinophils Absolute: 0.1 10*3/uL (ref 0.0–0.7)
Eosinophils Relative: 3.4 % (ref 0.0–5.0)
HCT: 44 % (ref 39.0–52.0)
HEMOGLOBIN: 14.8 g/dL (ref 13.0–17.0)
LYMPHS ABS: 1.2 10*3/uL (ref 0.7–4.0)
Lymphocytes Relative: 27.6 % (ref 12.0–46.0)
MCHC: 33.7 g/dL (ref 30.0–36.0)
MCV: 90.5 fl (ref 78.0–100.0)
MONO ABS: 0.6 10*3/uL (ref 0.1–1.0)
Monocytes Relative: 13.2 % — ABNORMAL HIGH (ref 3.0–12.0)
NEUTROS PCT: 55.2 % (ref 43.0–77.0)
Neutro Abs: 2.4 10*3/uL (ref 1.4–7.7)
Platelets: 146 10*3/uL — ABNORMAL LOW (ref 150.0–400.0)
RBC: 4.86 Mil/uL (ref 4.22–5.81)
RDW: 14.5 % (ref 11.5–15.5)
WBC: 4.4 10*3/uL (ref 4.0–10.5)

## 2018-06-29 LAB — AST: AST: 14 U/L (ref 0–37)

## 2018-06-29 LAB — HEMOGLOBIN A1C: Hgb A1c MFr Bld: 6.2 % (ref 4.6–6.5)

## 2018-06-29 LAB — ALT: ALT: 14 U/L (ref 0–53)

## 2018-06-29 MED ORDER — DOXAZOSIN MESYLATE 4 MG PO TABS: 4.0000 mg | ORAL_TABLET | Freq: Every day | ORAL | 2 refills | Status: DC

## 2018-06-29 NOTE — Progress Notes (Signed)
Subjective:    Patient ID: Thomas Bradley, male    DOB: [birth year], 72 y.o.   MRN: [id number]  DOS:  06/29/2018 Type of visit - description : rov Interval history: Hyperglycemia: Doing great with diet, has lost several pounds. HTN: Good compliance of medication, ambulatory BPs when checked are usually in the 112/70 range. BPH: Due to some concerns about doxazosin long-term effects, he decrease from 8 mg to 4 mg, symptoms are equally well controlled with a lower dose.  Wt Readings from Last 3 Encounters:  06/29/18 217 lb (98.4 kg)  11/09/17 225 lb 4 oz (102.2 kg)  05/08/17 222 lb (100.7 kg)     Review of Systems No chest pain no difficulty breathing No nausea, vomiting, diarrhea No headaches  Past Medical History:  Diagnosis Date  . Benign prostatic hypertrophy    ED, sees urology every year  . BPH (benign prostatic hypertrophy) with urinary obstruction    Dr. Risa Grill  . Elevated PSA    Dr. Risa Grill  . Erectile dysfunction due to arterial insufficiency    Dr. Risa Grill  . Erythema multiforme    dx 2013   . GERD (gastroesophageal reflux disease)   . Hyperlipemia   . Hypertension   . Nocturia   . Prostatitis    2010, again 09-2011    Past Surgical History:  Procedure Laterality Date  . HERNIA REPAIR  07-2010   open, R inguinal w/ mesh    Social History   Socioeconomic History  . Marital status: Married    Spouse name: Not on file  . Number of children: 2  . Years of education: Not on file  . Highest education level: Not on file  Occupational History  . Occupation: retired   Scientific laboratory technician  . Financial resource strain: Not on file  . Food insecurity:    Worry: Not on file    Inability: Not on file  . Transportation needs:    Medical: Not on file    Non-medical: Not on file  Tobacco Use  . Smoking status: Former Smoker    Types: Cigarettes    Last attempt to quit: 11/13/1976    Years since quitting: 41.6  . Smokeless tobacco: Never Used  Substance and Sexual  Activity  . Alcohol use: No  . Drug use: No  . Sexual activity: Never  Lifestyle  . Physical activity:    Days per week: Not on file    Minutes per session: Not on file  . Stress: Not on file  Relationships  . Social connections:    Talks on phone: Not on file    Gets together: Not on file    Attends religious service: Not on file    Active member of club or organization: Not on file    Attends meetings of clubs or organizations: Not on file    Relationship status: Not on file  . Intimate partner violence:    Fear of current or ex partner: Not on file    Emotionally abused: Not on file    Physically abused: Not on file    Forced sexual activity: Not on file  Other Topics Concern  . Not on file  Social History Narrative   Has 3 acres, works in the yard a lot x 6 hours sometimes, feels well.     4 G-kids           Allergies as of 06/29/2018   No Known Allergies     Medication List  Accurate as of 06/29/18  9:55 PM. Always use your most recent med list.          amLODipine 10 MG tablet Commonly known as:  NORVASC Take 1 tablet (10 mg total) by mouth daily.   B-12 2500 MCG Tabs Take 1 tablet by mouth daily. Reported on 02/01/2016   doxazosin 4 MG tablet Commonly known as:  CARDURA Take 1 tablet (4 mg total) by mouth daily.   glucosamine-chondroitin 500-400 MG tablet Take 1 tablet by mouth 3 (three) times daily.   multivitamin capsule Take 1 capsule by mouth daily. Reported on 02/01/2016   polyethylene glycol packet Commonly known as:  MIRALAX / GLYCOLAX Take 17 g by mouth daily as needed for moderate constipation.   pravastatin 40 MG tablet Commonly known as:  PRAVACHOL Take 1 tablet (40 mg total) by mouth at bedtime.   Vitamin D3 2000 units Tabs Take 1 tablet by mouth daily. Reported on 02/01/2016          Objective:   Physical Exam BP 124/82 (BP Location: Left Arm, Patient Position: Sitting, Cuff Size: Small)   Pulse 73   Temp 97.8 F (36.6 C)  (Oral)   Resp 18   Ht 6\' 1"  (1.854 m)   Wt 217 lb (98.4 kg)   SpO2 98%   BMI 28.63 kg/m  General:   Well developed, NAD, see BMI.  HEENT:  Normocephalic . Face symmetric, atraumatic Lungs:  CTA B Normal respiratory effort, no intercostal retractions, no accessory muscle use. Heart: RRR,  no murmur.  No pretibial edema bilaterally  Skin: Not pale. Not jaundice Neurologic:  alert & oriented X3.  Speech normal, gait appropriate for age and unassisted Psych--  Cognition and judgment appear intact.  Cooperative with normal attention span and concentration.  Behavior appropriate. No anxious or depressed appearing.      Assessment & Plan:    Assessment Prediabetes HTN (intol to losartan, 2018) Hyperlipidemia GERD Erythema multiforme 2013 GU: --BPH w/ elevated PSA, asymmetric prostate --Prostatitis 2010, 2012 --ED Has a epi-pen -- reason? Pt not sure, had an allergy to a medicine, name?  PLAN: Prediabetes: Diet controlled, has improved his lifestyle, is losing weight.  Check a A1c HTN: Currently on amlodipine, doxazosin.  BPs are under excellent control, check a CBC.  Last BMP satisfactory High cholesterol: On Pravachol, check a FLP, AST, ALT. BPH: Symptoms well controlled, due to some reports he read about doxazosin causing dementia he decrease dose from 8 to 4 mg.  Symptoms are still well controlled.  Plan: Decrease Cardura to 4 mg tablet, take 1 or even half tablet as long as symptoms controlled.  No evidence in the literature about dementia triggered/caused by Cardura. Will rec to watch BPs RTC 6 months.

## 2018-06-29 NOTE — Assessment & Plan Note (Signed)
Prediabetes: Diet controlled, has improved his lifestyle, is losing weight.  Check a A1c HTN: Currently on amlodipine, doxazosin.  BPs are under excellent control, check a CBC.  Last BMP satisfactory High cholesterol: On Pravachol, check a FLP, AST, ALT. BPH: Symptoms well controlled, due to some reports he read about doxazosin causing dementia he decrease dose from 8 to 4 mg.  Symptoms are still well controlled.  Plan: Decrease Cardura to 4 mg tablet, take 1 or even half tablet as long as symptoms controlled.  No evidence in the literature about dementia triggered/caused by Cardura. Will rec to watch BPs RTC 6 months.

## 2018-06-29 NOTE — Patient Instructions (Signed)
GO TO THE LAB : Get the blood work     GO TO THE FRONT DESK Schedule your next appointment for a  Check up in 6 months   

## 2018-08-16 DIAGNOSIS — Z23 Encounter for immunization: Secondary | ICD-10-CM | POA: Diagnosis not present

## 2018-09-02 ENCOUNTER — Other Ambulatory Visit: Payer: Self-pay | Admitting: Internal Medicine

## 2018-11-10 ENCOUNTER — Ambulatory Visit: Payer: BLUE CROSS/BLUE SHIELD | Admitting: *Deleted

## 2018-11-17 DIAGNOSIS — H2513 Age-related nuclear cataract, bilateral: Secondary | ICD-10-CM | POA: Diagnosis not present

## 2018-11-17 DIAGNOSIS — H35033 Hypertensive retinopathy, bilateral: Secondary | ICD-10-CM | POA: Diagnosis not present

## 2018-11-26 ENCOUNTER — Encounter: Payer: Self-pay | Admitting: Family Medicine

## 2018-11-26 ENCOUNTER — Ambulatory Visit (INDEPENDENT_AMBULATORY_CARE_PROVIDER_SITE_OTHER): Payer: Medicare Other | Admitting: Family Medicine

## 2018-11-26 VITALS — BP 128/80 | HR 75 | Temp 98.0°F | Ht 73.5 in | Wt 229.1 lb

## 2018-11-26 DIAGNOSIS — R300 Dysuria: Secondary | ICD-10-CM

## 2018-11-26 LAB — POC URINALSYSI DIPSTICK (AUTOMATED)
BILIRUBIN UA: NEGATIVE
Blood, UA: NEGATIVE
GLUCOSE UA: NEGATIVE
KETONES UA: NEGATIVE
Leukocytes, UA: NEGATIVE
Nitrite, UA: NEGATIVE
Protein, UA: NEGATIVE
UROBILINOGEN UA: 0.2 U/dL
pH, UA: 5.5 (ref 5.0–8.0)

## 2018-11-26 NOTE — Progress Notes (Signed)
Chief Complaint  Patient presents with  . Dysuria    TION TSE is a 73 y.o. male here for possible UTI.  Duration: 3 days. Symptoms: urinary frequency, urinary hesitancy and urinary retention Denies: hematuria, fever, nausea, vomiting, urinary incontinence, flank pain Hx of recurrent UTI? No Denies new sexual partners.   ROS:  Constitutional: denies fever GU: As noted in HPI  Past Medical History:  Diagnosis Date  . Benign prostatic hypertrophy    ED, sees urology every year  . BPH (benign prostatic hypertrophy) with urinary obstruction    Dr. Risa Grill  . Elevated PSA    Dr. Risa Grill  . Erectile dysfunction due to arterial insufficiency    Dr. Risa Grill  . Erythema multiforme    dx 2013   . GERD (gastroesophageal reflux disease)   . Hyperlipemia   . Hypertension   . Nocturia   . Prostatitis    2010, again 09-2011    BP 128/80 (BP Location: Left Arm, Patient Position: Sitting, Cuff Size: Normal)   Pulse 75   Temp 98 F (36.7 C) (Oral)   Ht 6' 1.5" (1.867 m)   Wt 229 lb 2 oz (103.9 kg)   SpO2 97%   BMI 29.82 kg/m  General: Awake, alert, appears stated age Heart: RRR Lungs: CTAB, normal respiratory effort, no accessory muscle usage Abd: BS+, soft, NT, ND, no masses or organomegaly MSK: No CVA tenderness, neg Lloyd's sign Rectal: Sphincter of good tone, prostate firm without bogginess or tenderness Psych: Age appropriate judgment and insight  Dysuria  UA neg, will cx and treat based on that.  Stay hydrated. Seek immediate care if pt starts to develop fevers, new/worsening symptoms, uncontrollable N/V. F/u prn. The patient voiced understanding and agreement to the plan.  [place], DO 11/26/18 2:09 PM

## 2018-11-26 NOTE — Patient Instructions (Signed)
Stay hydrated.   Warning signs/symptoms: Uncontrollable nausea/vomiting, fevers, worsening symptoms despite treatment, confusion.  Give us around 2 business days to get culture back to you.  Let us know if you need anything. 

## 2018-11-26 NOTE — Addendum Note (Signed)
Addended by: Sharon Seller B on: 11/26/2018 02:12 PM   Modules accepted: Orders

## 2018-11-26 NOTE — Progress Notes (Signed)
Pre visit review using our clinic review tool, if applicable. No additional management support is needed unless otherwise documented below in the visit note. 

## 2018-11-27 LAB — URINE CULTURE
MICRO NUMBER:: 134347
Result:: NO GROWTH
SPECIMEN QUALITY: ADEQUATE

## 2018-12-04 ENCOUNTER — Other Ambulatory Visit: Payer: Self-pay | Admitting: Internal Medicine

## 2018-12-27 NOTE — Progress Notes (Addendum)
Subjective:   Thomas Bradley is a 73 y.o. male who presents for Medicare Annual/Subsequent preventive examination.  Goes out daily with wife. Fiddles with wood work. Church every Sunday.  Review of Systems: No ROS.  Medicare Wellness Visit. Additional risk factors are reflected in the social history. Cardiac Risk Factors include: advanced age (>74men, >67 women);dyslipidemia;hypertension;male gender Sleep patterns:  No issues. Home Safety/Smoke Alarms: Feels safe in home. Smoke alarms in place.  Lives with wife in 1 story home. Walk in shower.   Eye- Dr. Lindalou Bradley yearly.  Male:   CCS- last 07/04/15. 5 yr recall per pt     PSA-  Lab Results  Component Value Date   PSA 3.94 05/08/2017   PSA 2.17 11/30/2015   PSA 4.59 (H) 05/01/2015       Objective:    Vitals: BP 140/84 (BP Location: Left Arm, Patient Position: Sitting, Cuff Size: Normal)   Pulse 88   Ht 6\' 2"  (1.88 m)   Wt 227 lb 6.4 oz (103.1 kg)   SpO2 96%   BMI 29.20 kg/m   Body mass index is 29.2 kg/m.  Advanced Directives 12/28/2018 11/09/2017  Does Patient Have a Medical Advance Directive? No No  Would patient like information on creating a medical advance directive? No - Patient declined No - Patient declined    Tobacco Social History   Tobacco Use  Smoking Status Former Smoker  . Types: Cigarettes  . Last attempt to quit: 11/13/1976  . Years since quitting: 42.1  Smokeless Tobacco Never Used     Counseling given: Not Answered   Clinical Intake: Pain : No/denies pain   Past Medical History:  Diagnosis Date  . Benign prostatic hypertrophy    ED, sees urology every year  . BPH (benign prostatic hypertrophy) with urinary obstruction    Dr. Risa Grill  . Elevated PSA    Dr. Risa Grill  . Erectile dysfunction due to arterial insufficiency    Dr. Risa Grill  . Erythema multiforme    dx 2013   . GERD (gastroesophageal reflux disease)   . Hyperlipemia   . Hypertension   . Nocturia   . Prostatitis    2010, again  09-2011   Past Surgical History:  Procedure Laterality Date  . HERNIA REPAIR  07-2010   open, R inguinal w/ mesh   Family History  Problem Relation Age of Onset  . Coronary artery disease Father 60  . Hypertension Sister   . Diabetes Brother   . Stroke Neg Hx   . Colon cancer Neg Hx   . Prostate cancer Neg Hx    Social History   Socioeconomic History  . Marital status: Married    Spouse name: Not on file  . Number of children: 2  . Years of education: Not on file  . Highest education level: Not on file  Occupational History  . Occupation: retired   Scientific laboratory technician  . Financial resource strain: Not on file  . Food insecurity:    Worry: Not on file    Inability: Not on file  . Transportation needs:    Medical: Not on file    Non-medical: Not on file  Tobacco Use  . Smoking status: Former Smoker    Types: Cigarettes    Last attempt to quit: 11/13/1976    Years since quitting: 42.1  . Smokeless tobacco: Never Used  Substance and Sexual Activity  . Alcohol use: No  . Drug use: No  . Sexual activity: Never  Lifestyle  . Physical activity:    Days per week: Not on file    Minutes per session: Not on file  . Stress: Not on file  Relationships  . Social connections:    Talks on phone: Not on file    Gets together: Not on file    Attends religious service: Not on file    Active member of club or organization: Not on file    Attends meetings of clubs or organizations: Not on file    Relationship status: Not on file  Other Topics Concern  . Not on file  Social History Narrative   Has 3 acres, works in the yard a lot x 6 hours sometimes, feels well.     4 G-kids         Outpatient Encounter Medications as of 12/28/2018  Medication Sig  . amLODipine (NORVASC) 10 MG tablet Take 1 tablet (10 mg total) by mouth daily.  . Cholecalciferol (VITAMIN D3) 2000 UNITS TABS Take 1 tablet by mouth daily. Reported on 02/01/2016  . Cyanocobalamin (B-12) 2500 MCG TABS Take 1 tablet by  mouth daily. Reported on 02/01/2016  . doxazosin (CARDURA) 4 MG tablet Take 1 tablet (4 mg total) by mouth daily.  Marland Kitchen glucosamine-chondroitin 500-400 MG tablet Take 1 tablet by mouth daily.   . Multiple Vitamin (MULTIVITAMIN) capsule Take 1 capsule by mouth daily. Reported on 02/01/2016  . pravastatin (PRAVACHOL) 40 MG tablet Take 1 tablet (40 mg total) by mouth at bedtime.   No facility-administered encounter medications on file as of 12/28/2018.     Activities of Daily Living In your present state of health, do you have any difficulty performing the following activities: 12/28/2018  Hearing? N  Vision? N  Difficulty concentrating or making decisions? N  Walking or climbing stairs? N  Dressing or bathing? N  Doing errands, shopping? N  Preparing Food and eating ? N  Using the Toilet? N  In the past six months, have you accidently leaked urine? N  Do you have problems with loss of bowel control? N  Managing your Medications? N  Managing your Finances? N  Housekeeping or managing your Housekeeping? N  Some recent data might be hidden    Patient Care Team: Colon Branch, MD as PCP - General Rana Snare, MD as Consulting Physician (Urology)   Assessment:   This is a routine wellness examination for [name]. Physical assessment deferred to PCP.  Exercise Activities and Dietary recommendations Current Exercise Habits: The patient does not participate in regular exercise at present, Exercise limited by: None identified Diet (meal preparation, eat out, water intake, caffeinated beverages, dairy products, fruits and vegetables): in general, a "healthy" diet  , well balanced Drinks plenty of water.  Goals    . Increase physical activity    . Maintain current health (pt-stated)       Fall Risk Fall Risk  12/28/2018 11/09/2017 11/09/2017 05/06/2016 11/02/2015  Falls in the past year? 0 No No No No    Depression Screen PHQ 2/9 Scores 12/28/2018 11/09/2017 11/09/2017 05/06/2016  PHQ - 2 Score 0 0 0 0     Cognitive Function Ad8 score reviewed for issues:  Issues making decisions:no  Less interest in hobbies / activities:no  Repeats questions, stories (family complaining):no  Trouble using ordinary gadgets (microwave, computer, phone):no  Forgets the month or year: no  Mismanaging finances: no  Remembering appts:no  Daily problems with thinking and/or memory:no Ad8 score is=0   MMSE - Mini  Mental State Exam 11/09/2017  Orientation to time 5  Orientation to Place 5  Registration 3  Attention/ Calculation 5  Recall 2  Language- name 2 objects 2  Language- repeat 1  Language- follow 3 step command 3  Language- read & follow direction 1  Write a sentence 1  Copy design 1  Total score 29        Immunization History  Administered Date(s) Administered  . Influenza, High Dose Seasonal PF 07/27/2013, 08/16/2018  . Influenza,inj,Quad PF,6+ Mos 08/02/2015  . Influenza-Unspecified 08/14/2016, 07/27/2017  . Pneumococcal Conjugate-13 04/27/2014  . Pneumococcal Polysaccharide-23 01/23/2009  . Tdap 01/28/2011, 07/25/2015  . Zoster 07/22/2010   Screening Tests Health Maintenance  Topic Date Due  . COLONOSCOPY  07/03/2020  . TETANUS/TDAP  07/24/2025  . INFLUENZA VACCINE  Completed  . Hepatitis C Screening  Completed  . PNA vac Low Risk Adult  Completed         Plan:    Please schedule your next medicare wellness visit with me in 1 yr.  Continue to eat heart healthy diet (full of fruits, vegetables, whole grains, lean protein, water--limit salt, fat, and sugar intake) and increase physical activity as tolerated.    I have personally reviewed and noted the following in the patient's chart:   . Medical and social history . Use of alcohol, tobacco or illicit drugs  . Current medications and supplements . Functional ability and status . Nutritional status . Physical activity . Advanced directives . List of other physicians . Hospitalizations, surgeries, and ER  visits in previous 12 months . Vitals . Screenings to include cognitive, depression, and falls . Referrals and appointments  In addition, I have reviewed and discussed with patient certain preventive protocols, quality metrics, and best practice recommendations. A written personalized care plan for preventive services as well as general preventive health recommendations were provided to patient.     Naaman Plummer [place], [place]  12/28/2018  Kathlene November, MD

## 2018-12-28 ENCOUNTER — Ambulatory Visit (INDEPENDENT_AMBULATORY_CARE_PROVIDER_SITE_OTHER): Payer: Medicare Other | Admitting: *Deleted

## 2018-12-28 ENCOUNTER — Encounter: Payer: Self-pay | Admitting: *Deleted

## 2018-12-28 ENCOUNTER — Encounter: Payer: Self-pay | Admitting: Internal Medicine

## 2018-12-28 ENCOUNTER — Ambulatory Visit (INDEPENDENT_AMBULATORY_CARE_PROVIDER_SITE_OTHER): Payer: Medicare Other | Admitting: Internal Medicine

## 2018-12-28 VITALS — BP 140/84 | HR 88 | Ht 74.0 in | Wt 227.4 lb

## 2018-12-28 DIAGNOSIS — Z0000 Encounter for general adult medical examination without abnormal findings: Secondary | ICD-10-CM

## 2018-12-28 DIAGNOSIS — I10 Essential (primary) hypertension: Secondary | ICD-10-CM | POA: Diagnosis not present

## 2018-12-28 DIAGNOSIS — E785 Hyperlipidemia, unspecified: Secondary | ICD-10-CM

## 2018-12-28 DIAGNOSIS — R972 Elevated prostate specific antigen [PSA]: Secondary | ICD-10-CM | POA: Diagnosis not present

## 2018-12-28 DIAGNOSIS — R739 Hyperglycemia, unspecified: Secondary | ICD-10-CM | POA: Diagnosis not present

## 2018-12-28 LAB — CBC WITH DIFFERENTIAL/PLATELET
BASOS PCT: 0.5 % (ref 0.0–3.0)
Basophils Absolute: 0 10*3/uL (ref 0.0–0.1)
EOS ABS: 0.1 10*3/uL (ref 0.0–0.7)
Eosinophils Relative: 2.1 % (ref 0.0–5.0)
HEMATOCRIT: 43.7 % (ref 39.0–52.0)
Hemoglobin: 14.9 g/dL (ref 13.0–17.0)
LYMPHS PCT: 20.8 % (ref 12.0–46.0)
Lymphs Abs: 1.1 10*3/uL (ref 0.7–4.0)
MCHC: 34.2 g/dL (ref 30.0–36.0)
MCV: 90.3 fl (ref 78.0–100.0)
MONOS PCT: 9.5 % (ref 3.0–12.0)
Monocytes Absolute: 0.5 10*3/uL (ref 0.1–1.0)
NEUTROS ABS: 3.5 10*3/uL (ref 1.4–7.7)
Neutrophils Relative %: 67.1 % (ref 43.0–77.0)
PLATELETS: 166 10*3/uL (ref 150.0–400.0)
RBC: 4.84 Mil/uL (ref 4.22–5.81)
RDW: 14.8 % (ref 11.5–15.5)
WBC: 5.2 10*3/uL (ref 4.0–10.5)

## 2018-12-28 LAB — BASIC METABOLIC PANEL
BUN: 30 mg/dL — ABNORMAL HIGH (ref 6–23)
CHLORIDE: 104 meq/L (ref 96–112)
CO2: 25 meq/L (ref 19–32)
CREATININE: 1.09 mg/dL (ref 0.40–1.50)
Calcium: 9.5 mg/dL (ref 8.4–10.5)
GFR: 65.95 mL/min (ref >60.00)
GLUCOSE: 118 mg/dL — AB (ref 70–99)
Potassium: 4.2 mEq/L (ref 3.5–5.1)
Sodium: 139 mEq/L (ref 135–145)

## 2018-12-28 LAB — HEMOGLOBIN A1C: Hgb A1c MFr Bld: 6.6 % — ABNORMAL HIGH (ref 4.6–6.5)

## 2018-12-28 LAB — PSA: PSA: 2.23 ng/mL (ref 0.10–4.00)

## 2018-12-28 MED ORDER — ZOSTER VAC RECOMB ADJUVANTED 50 MCG/0.5ML IM SUSR: 0.5000 mL | Freq: Once | INTRAMUSCULAR | 1 refills | Status: AC

## 2018-12-28 NOTE — Progress Notes (Signed)
Subjective:    Patient ID: Thomas Bradley, male    DOB: Jan 04, 1946, 73 y.o.   MRN: [id number]  DOS:  12/28/2018 Type of visit - description: Routine visit In general feeling well. HTN: Reports good compliance with medications, ambulatory BPs normal High cholesterol: On statins, no apparent side effects Recently was seen by Dr. Nani Ravens, he was doing some exercises after that he felt some dysuria and urinary odor (unclear if exercise was related to sxs). DRE was done, a urine culture was negative.  He was not prescribed any antibiotics and at this point is much better.  Wt Readings from Last 3 Encounters:  12/28/18 227 lb 6 oz (103.1 kg)  12/28/18 227 lb 6.4 oz (103.1 kg)  11/26/18 229 lb 2 oz (103.9 kg)    Review of Systems  Denies fever chills No chest pain no difficulty breathing. No lower extremity edema No gross hematuria. Diet typically healthy, needs to exercise more Past Medical History:  Diagnosis Date  . Benign prostatic hypertrophy    ED, sees urology every year  . BPH (benign prostatic hypertrophy) with urinary obstruction    Dr. Risa Grill  . Elevated PSA    Dr. Risa Grill  . Erectile dysfunction due to arterial insufficiency    Dr. Risa Grill  . Erythema multiforme    dx 2013   . GERD (gastroesophageal reflux disease)   . Hyperlipemia   . Hypertension   . Nocturia   . Prostatitis    2010, again 09-2011    Past Surgical History:  Procedure Laterality Date  . HERNIA REPAIR  07-2010   open, R inguinal w/ mesh    Social History   Socioeconomic History  . Marital status: Married    Spouse name: Not on file  . Number of children: 2  . Years of education: Not on file  . Highest education level: Not on file  Occupational History  . Occupation: retired   Scientific laboratory technician  . Financial resource strain: Not on file  . Food insecurity:    Worry: Not on file    Inability: Not on file  . Transportation needs:    Medical: Not on file    Non-medical: Not on file    Tobacco Use  . Smoking status: Former Smoker    Types: Cigarettes    Last attempt to quit: 11/13/1976    Years since quitting: 42.1  . Smokeless tobacco: Never Used  Substance and Sexual Activity  . Alcohol use: No  . Drug use: No  . Sexual activity: Never  Lifestyle  . Physical activity:    Days per week: Not on file    Minutes per session: Not on file  . Stress: Not on file  Relationships  . Social connections:    Talks on phone: Not on file    Gets together: Not on file    Attends religious service: Not on file    Active member of club or organization: Not on file    Attends meetings of clubs or organizations: Not on file    Relationship status: Not on file  . Intimate partner violence:    Fear of current or ex partner: Not on file    Emotionally abused: Not on file    Physically abused: Not on file    Forced sexual activity: Not on file  Other Topics Concern  . Not on file  Social History Narrative   Has 3 acres, works in the yard a lot x 6 hours sometimes,  feels well.     4 G-kids           Allergies as of 12/28/2018   No Known Allergies     Medication List       Accurate as of December 28, 2018  9:54 AM. Always use your most recent med list.        amLODipine 10 MG tablet Commonly known as:  NORVASC Take 1 tablet (10 mg total) by mouth daily.   B-12 2500 MCG Tabs Take 1 tablet by mouth daily. Reported on 02/01/2016   doxazosin 4 MG tablet Commonly known as:  CARDURA Take 1 tablet (4 mg total) by mouth daily.   glucosamine-chondroitin 500-400 MG tablet Take 1 tablet by mouth daily.   multivitamin capsule Take 1 capsule by mouth daily. Reported on 02/01/2016   pravastatin 40 MG tablet Commonly known as:  PRAVACHOL Take 1 tablet (40 mg total) by mouth at bedtime.   Vitamin D3 50 MCG (2000 UT) Tabs Take 1 tablet by mouth daily. Reported on 02/01/2016           Objective:   Physical Exam BP 140/84 (BP Location: Left Arm, Patient Position: Sitting,  Cuff Size: Normal)   Pulse 88   Ht 6\' 2"  (1.88 m)   Wt 227 lb 6 oz (103.1 kg)   SpO2 96%   BMI 29.19 kg/m  General:   Well developed, NAD, BMI noted.  HEENT:  Normocephalic . Face symmetric, atraumatic Lungs:  CTA B Normal respiratory effort, no intercostal retractions, no accessory muscle use. Heart: RRR,  no murmur.  no pretibial edema bilaterally  Abdomen:  Not distended, soft, non-tender. No rebound or rigidity.   Skin: Not pale. Not jaundice DRE: Declined  Neurologic:  alert & oriented X3.  Speech normal, gait appropriate for age and unassisted Psych--  Cognition and judgment appear intact.  Cooperative with normal attention span and concentration.  Behavior appropriate. No anxious or depressed appearing.     Assessment    Assessment Prediabetes HTN (intol to losartan, 2018) Hyperlipidemia GERD Erythema multiforme 2013 GU: --BPH w/ elevated PSA, asymmetric prostate --Prostatitis 2010, 2012 --ED Has a epi-pen -- reason? Pt not sure, had an allergy to a medicine, name?  PLAN: Had a Medicare wellness today Prediabetes: We discussed exercise, encourage daily walks. Check a A1C HTN: Currently on Cardura.  Ambulatory BPs 120/70s, BP today in the 140s, no change for now.  Check BMP and CBC Slightly decreased platelets: See last CBC, rechecking labs today BPH with elevated PSA: Declined a DRE today, it was done few weeks ago by another provider in the context of dysuria, reportedly normal..  LUTS resolved, check a PSA. Last visit with urology 11-2015. Preventive care: Shingrix prescription printed. RTC 6 months

## 2018-12-28 NOTE — Progress Notes (Signed)
Pre visit review using our clinic review tool, if applicable. No additional management support is needed unless otherwise documented below in the visit note. 

## 2018-12-28 NOTE — Patient Instructions (Signed)
GO TO THE LAB : Get the blood work     GO TO THE FRONT DESK Schedule your next appointment for a   checkup in 6 months, fasting 

## 2018-12-28 NOTE — Assessment & Plan Note (Addendum)
Had a Medicare wellness today Prediabetes: We discussed exercise, encourage daily walks. Check a A1C HTN: Currently on Cardura.  Ambulatory BPs 120/70s, BP today in the 140s, no change for now.  Check BMP and CBC Slightly decreased platelets: See last CBC, rechecking labs today BPH with elevated PSA: Declined a DRE today, it was done few weeks ago by another provider in the context of dysuria, reportedly normal..  LUTS resolved, check a PSA. Last visit with urology 11-2015. Preventive care: Shingrix prescription printed. RTC 6 months

## 2018-12-28 NOTE — Patient Instructions (Signed)
Please schedule your next medicare wellness visit with me in 1 yr.  Continue to eat heart healthy diet (full of fruits, vegetables, whole grains, lean protein, water--limit salt, fat, and sugar intake) and increase physical activity as tolerated.   Thomas Bradley , Thank you for taking time to come for your Medicare Wellness Visit. I appreciate your ongoing commitment to your health goals. Please review the following plan we discussed and let me know if I can assist you in the future.   These are the goals we discussed: Goals    . Increase physical activity    . Maintain current health (pt-stated)       This is a list of the screening recommended for you and due dates:  Health Maintenance  Topic Date Due  . Colon Cancer Screening  07/03/2020  . Tetanus Vaccine  07/24/2025  . Flu Shot  Completed  .  Hepatitis C: One time screening is recommended by Center for Disease Control  (CDC) for  adults born from [birth year] through 1967.   Completed  . Pneumonia vaccines  Completed    Health Maintenance After Age 36 After age 58, you are at a higher risk for certain long-term diseases and infections as well as injuries from falls. Falls are a major cause of broken bones and head injuries in people who are older than age 82. Getting regular preventive care can help to keep you healthy and well. Preventive care includes getting regular testing and making lifestyle changes as recommended by your health care provider. Talk with your health care provider about:  Which screenings and tests you should have. A screening is a test that checks for a disease when you have no symptoms.  A diet and exercise plan that is right for you. What should I know about screenings and tests to prevent falls? Screening and testing are the best ways to find a health problem early. Early diagnosis and treatment give you the best chance of managing medical conditions that are common after age 69. Certain conditions and lifestyle  choices may make you more likely to have a fall. Your health care provider may recommend:  Regular vision checks. Poor vision and conditions such as cataracts can make you more likely to have a fall. If you wear glasses, make sure to get your prescription updated if your vision changes.  Medicine review. Work with your health care provider to regularly review all of the medicines you are taking, including over-the-counter medicines. Ask your health care provider about any side effects that may make you more likely to have a fall. Tell your health care provider if any medicines that you take make you feel dizzy or sleepy.  Osteoporosis screening. Osteoporosis is a condition that causes the bones to get weaker. This can make the bones weak and cause them to break more easily.  Blood pressure screening. Blood pressure changes and medicines to control blood pressure can make you feel dizzy.  Strength and balance checks. Your health care provider may recommend certain tests to check your strength and balance while standing, walking, or changing positions.  Foot health exam. Foot pain and numbness, as well as not wearing proper footwear, can make you more likely to have a fall.  Depression screening. You may be more likely to have a fall if you have a fear of falling, feel emotionally low, or feel unable to do activities that you used to do.  Alcohol use screening. Using too much alcohol can affect your  balance and may make you more likely to have a fall. What actions can I take to lower my risk of falls? General instructions  Talk with your health care provider about your risks for falling. Tell your health care provider if: ? You fall. Be sure to tell your health care provider about all falls, even ones that seem minor. ? You feel dizzy, sleepy, or off-balance.  Take over-the-counter and prescription medicines only as told by your health care provider. These include any supplements.  Eat a  healthy diet and maintain a healthy weight. A healthy diet includes low-fat dairy products, low-fat (lean) meats, and fiber from whole grains, beans, and lots of fruits and vegetables. Home safety  Remove any tripping hazards, such as rugs, cords, and clutter.  Install safety equipment such as grab bars in bathrooms and safety rails on stairs.  Keep rooms and walkways well-lit. Activity   Follow a regular exercise program to stay fit. This will help you maintain your balance. Ask your health care provider what types of exercise are appropriate for you.  If you need a cane or walker, use it as recommended by your health care provider.  Wear supportive shoes that have nonskid soles. Lifestyle  Do not drink alcohol if your health care provider tells you not to drink.  If you drink alcohol, limit how much you have: ? 0-1 drink a day for women. ? 0-2 drinks a day for men.  Be aware of how much alcohol is in your drink. In the [place] one drink equals one typical bottle of beer (12 oz), one-half glass of wine (5 oz), or one shot of hard liquor (1 oz).  Do not use any products that contain nicotine or tobacco, such as cigarettes and e-cigarettes. If you need help quitting, ask your health care provider. Summary  Having a healthy lifestyle and getting preventive care can help to protect your health and wellness after age 43.  Screening and testing are the best way to find a health problem early and help you avoid having a fall. Early diagnosis and treatment give you the best chance for managing medical conditions that are more common for people who are older than age 37.  Falls are a major cause of broken bones and head injuries in people who are older than age 4. Take precautions to prevent a fall at home.  Work with your health care provider to learn what changes you can make to improve your health and wellness and to prevent falls. This information is not intended to replace advice  given to you by your health care provider. Make sure you discuss any questions you have with your health care provider. Document Released: 08/26/2017 Document Revised: 08/26/2017 Document Reviewed: 08/26/2017 Elsevier Interactive Patient Education  2019 [employer].

## 2019-02-05 DIAGNOSIS — N390 Urinary tract infection, site not specified: Secondary | ICD-10-CM | POA: Diagnosis not present

## 2019-02-05 DIAGNOSIS — N12 Tubulo-interstitial nephritis, not specified as acute or chronic: Secondary | ICD-10-CM | POA: Diagnosis not present

## 2019-03-05 ENCOUNTER — Other Ambulatory Visit: Payer: Self-pay | Admitting: Internal Medicine

## 2019-03-29 ENCOUNTER — Other Ambulatory Visit: Payer: Self-pay | Admitting: Internal Medicine

## 2019-06-28 ENCOUNTER — Other Ambulatory Visit: Payer: Self-pay

## 2019-06-29 ENCOUNTER — Other Ambulatory Visit: Payer: Self-pay | Admitting: Internal Medicine

## 2019-06-30 ENCOUNTER — Other Ambulatory Visit: Payer: Self-pay

## 2019-06-30 ENCOUNTER — Ambulatory Visit (INDEPENDENT_AMBULATORY_CARE_PROVIDER_SITE_OTHER): Payer: Medicare Other | Admitting: Internal Medicine

## 2019-06-30 ENCOUNTER — Encounter: Payer: Self-pay | Admitting: Internal Medicine

## 2019-06-30 VITALS — BP 123/71 | HR 79 | Temp 96.8°F | Resp 16 | Ht 74.0 in | Wt 210.4 lb

## 2019-06-30 DIAGNOSIS — E785 Hyperlipidemia, unspecified: Secondary | ICD-10-CM | POA: Diagnosis not present

## 2019-06-30 DIAGNOSIS — R739 Hyperglycemia, unspecified: Secondary | ICD-10-CM | POA: Diagnosis not present

## 2019-06-30 DIAGNOSIS — I10 Essential (primary) hypertension: Secondary | ICD-10-CM

## 2019-06-30 LAB — HEMOGLOBIN A1C: Hgb A1c MFr Bld: 6.5 % (ref 4.6–6.5)

## 2019-06-30 LAB — AST: AST: 15 U/L (ref 0–37)

## 2019-06-30 LAB — LIPID PANEL
Cholesterol: 152 mg/dL (ref 0–200)
HDL: 43.8 mg/dL (ref >39.00)
LDL Cholesterol: 94 mg/dL (ref 0–99)
NonHDL: 107.91
Total CHOL/HDL Ratio: 3
Triglycerides: 68 mg/dL (ref 0.0–149.0)
VLDL: 13.6 mg/dL (ref 0.0–40.0)

## 2019-06-30 LAB — ALT: ALT: 14 U/L (ref 0–53)

## 2019-06-30 NOTE — Assessment & Plan Note (Signed)
DM: Last A1c was 6.6, patient is doing great, has lost 17 pounds, is determined to continue with his healthier diet.  He remains active although is limited by DJD.  Will check a A1c. HTN: Controlled on amlodipine, Cardura.  BPs range from 101-119.  DBP 60-70.  No change High cholesterol: On Pravachol, check FLP Recommend early flu shot, will do in a week or 2   along with his wife. Patient education:  Follows all CDC guideline recommendations regards COVID 19 RTC 6 months

## 2019-06-30 NOTE — Progress Notes (Signed)
Subjective:    Patient ID: Thomas Bradley, male    DOB: 1946-02-04, 73 y.o.   MRN: [id number]  DOS:  06/30/2019 Type of visit - description: rov Since the last office visit, he has lost 17 pounds, doing great with diet, + portion control. He remains as active as he can, exercise somewhat limited by MSK issues. BP log reviewed  Wt Readings from Last 3 Encounters:  06/30/19 210 lb 6 oz (95.4 kg)  12/28/18 227 lb 6 oz (103.1 kg)  12/28/18 227 lb 6.4 oz (103.1 kg)       Review of Systems  Denies chest pain no difficulty breathing No nausea vomiting or diarrhea Occasional nocturia without any other LUTS Occasionally ears are itchy at night.  Past Medical History:  Diagnosis Date  . Benign prostatic hypertrophy    ED, sees urology every year  . BPH (benign prostatic hypertrophy) with urinary obstruction    Dr. Risa Grill  . Elevated PSA    Dr. Risa Grill  . Erectile dysfunction due to arterial insufficiency    Dr. Risa Grill  . Erythema multiforme    dx 2013   . GERD (gastroesophageal reflux disease)   . Hyperlipemia   . Hypertension   . Nocturia   . Prostatitis    2010, again 09-2011    Past Surgical History:  Procedure Laterality Date  . HERNIA REPAIR  07-2010   open, R inguinal w/ mesh    Social History   Socioeconomic History  . Marital status: Married    Spouse name: Not on file  . Number of children: 2  . Years of education: Not on file  . Highest education level: Not on file  Occupational History  . Occupation: retired   Scientific laboratory technician  . Financial resource strain: Not on file  . Food insecurity    Worry: Not on file    Inability: Not on file  . Transportation needs    Medical: Not on file    Non-medical: Not on file  Tobacco Use  . Smoking status: Former Smoker    Types: Cigarettes    Quit date: 11/13/1976    Years since quitting: 42.6  . Smokeless tobacco: Never Used  Substance and Sexual Activity  . Alcohol use: No  . Drug use: No  . Sexual activity:  Never  Lifestyle  . Physical activity    Days per week: Not on file    Minutes per session: Not on file  . Stress: Not on file  Relationships  . Social Herbalist on phone: Not on file    Gets together: Not on file    Attends religious service: Not on file    Active member of club or organization: Not on file    Attends meetings of clubs or organizations: Not on file    Relationship status: Not on file  . Intimate partner violence    Fear of current or ex partner: Not on file    Emotionally abused: Not on file    Physically abused: Not on file    Forced sexual activity: Not on file  Other Topics Concern  . Not on file  Social History Narrative   Has 3 acres, works in the yard a lot x 6 hours sometimes, feels well.     4 G-kids           Allergies as of 06/30/2019   No Known Allergies     Medication List  Accurate as of June 30, 2019  9:09 AM. If you have any questions, ask your nurse or doctor.        amLODipine 10 MG tablet Commonly known as: NORVASC Take 1 tablet (10 mg total) by mouth daily.   B-12 2500 MCG Tabs Take 1 tablet by mouth daily. Reported on 02/01/2016   doxazosin 4 MG tablet Commonly known as: CARDURA Take 1 tablet (4 mg total) by mouth daily.   glucosamine-chondroitin 500-400 MG tablet Take 1 tablet by mouth daily.   multivitamin capsule Take 1 capsule by mouth daily. Reported on 02/01/2016   pravastatin 40 MG tablet Commonly known as: PRAVACHOL Take 1 tablet (40 mg total) by mouth at bedtime.   Vitamin D3 50 MCG (2000 UT) Tabs Take 1 tablet by mouth daily. Reported on 02/01/2016   VITAMIN E PO Take by mouth.           Objective:   Physical Exam BP 123/71 (BP Location: Left Arm, Patient Position: Sitting, Cuff Size: Normal)   Pulse 79   Temp (!) 96.8 F (36 C) (Temporal)   Resp 16   Ht 6\' 2"  (1.88 m)   Wt 210 lb 6 oz (95.4 kg)   SpO2 100%   BMI 27.01 kg/m  General:   Well developed, NAD, BMI noted. HEENT:   Normocephalic . Face symmetric, atraumatic Ears: Normal canals and TMs Lungs:  CTA B Normal respiratory effort, no intercostal retractions, no accessory muscle use. Heart: RRR,  no murmur.  No pretibial edema bilaterally  Skin: Not pale. Not jaundice Neurologic:  alert & oriented X3.  Speech normal, gait appropriate for age and unassisted Psych--  Cognition and judgment appear intact.  Cooperative with normal attention span and concentration.  Behavior appropriate. No anxious or depressed appearing.      Assessment     Assessment DM HTN (intol to losartan, 2018) Hyperlipidemia GERD Erythema multiforme 2013 GU: --BPH w/ elevated PSA, asymmetric prostate --Prostatitis 2010, 2012 --ED Has a epi-pen -- reason? Pt not sure, had an allergy to a medicine, name?  PLAN: DM: Last A1c was 6.6, patient is doing great, has lost 17 pounds, is determined to continue with his healthier diet.  He remains active although is limited by DJD.  Will check a A1c. HTN: Controlled on amlodipine, Cardura.  BPs range from 101-119.  DBP 60-70.  No change High cholesterol: On Pravachol, check FLP Recommend early flu shot, will do in a week or 2   along with his wife. Patient education:  Follows all CDC guideline recommendations regards COVID 19 RTC 6 months

## 2019-06-30 NOTE — Progress Notes (Signed)
Pre visit review using our clinic review tool, if applicable. No additional management support is needed unless otherwise documented below in the visit note. 

## 2019-06-30 NOTE — Patient Instructions (Signed)
GO TO THE LAB : Get the blood work     GO TO THE FRONT DESK Schedule your next appointment for a   checkup in 6 months 

## 2019-07-11 DIAGNOSIS — Z23 Encounter for immunization: Secondary | ICD-10-CM | POA: Diagnosis not present

## 2019-08-17 DIAGNOSIS — M25552 Pain in left hip: Secondary | ICD-10-CM | POA: Diagnosis not present

## 2019-08-17 DIAGNOSIS — M1612 Unilateral primary osteoarthritis, left hip: Secondary | ICD-10-CM | POA: Diagnosis not present

## 2019-08-29 ENCOUNTER — Other Ambulatory Visit: Payer: Self-pay | Admitting: Internal Medicine

## 2019-09-27 ENCOUNTER — Other Ambulatory Visit: Payer: Self-pay | Admitting: Internal Medicine

## 2019-10-06 ENCOUNTER — Other Ambulatory Visit: Payer: Self-pay | Admitting: Orthopedic Surgery

## 2019-10-06 DIAGNOSIS — M1611 Unilateral primary osteoarthritis, right hip: Secondary | ICD-10-CM | POA: Diagnosis not present

## 2019-10-07 ENCOUNTER — Encounter (HOSPITAL_COMMUNITY)
Admission: RE | Admit: 2019-10-07 | Discharge: 2019-10-07 | Disposition: A | Discharge status: Home / Self Care | Payer: Medicare Other | Source: Ambulatory Visit | Attending: Orthopedic Surgery | Admitting: Orthopedic Surgery

## 2019-10-07 ENCOUNTER — Encounter (HOSPITAL_COMMUNITY): Payer: Self-pay

## 2019-10-07 ENCOUNTER — Other Ambulatory Visit: Payer: Self-pay

## 2019-10-07 ENCOUNTER — Ambulatory Visit (HOSPITAL_COMMUNITY)
Admission: RE | Admit: 2019-10-07 | Discharge: 2019-10-07 | Disposition: A | Discharge status: Home / Self Care | Payer: Medicare Other | Source: Ambulatory Visit | Attending: Orthopedic Surgery | Admitting: Orthopedic Surgery

## 2019-10-07 ENCOUNTER — Other Ambulatory Visit (HOSPITAL_COMMUNITY)
Admission: RE | Admit: 2019-10-07 | Discharge: 2019-10-07 | Disposition: A | Discharge status: Home / Self Care | Payer: Medicare Other | Source: Ambulatory Visit | Attending: Orthopedic Surgery | Admitting: Orthopedic Surgery

## 2019-10-07 DIAGNOSIS — I700 Atherosclerosis of aorta: Secondary | ICD-10-CM | POA: Diagnosis not present

## 2019-10-07 DIAGNOSIS — Z20828 Contact with and (suspected) exposure to other viral communicable diseases: Secondary | ICD-10-CM | POA: Diagnosis not present

## 2019-10-07 DIAGNOSIS — Z01818 Encounter for other preprocedural examination: Secondary | ICD-10-CM

## 2019-10-07 DIAGNOSIS — M87052 Idiopathic aseptic necrosis of left femur: Secondary | ICD-10-CM | POA: Diagnosis not present

## 2019-10-07 DIAGNOSIS — M87059 Idiopathic aseptic necrosis of unspecified femur: Secondary | ICD-10-CM | POA: Diagnosis present

## 2019-10-07 DIAGNOSIS — Z96649 Presence of unspecified artificial hip joint: Secondary | ICD-10-CM | POA: Diagnosis not present

## 2019-10-07 LAB — URINALYSIS, ROUTINE W REFLEX MICROSCOPIC
Bacteria, UA: NONE SEEN
Bilirubin Urine: NEGATIVE
Glucose, UA: NEGATIVE mg/dL
Hgb urine dipstick: NEGATIVE
Ketones, ur: 5 mg/dL — AB
Nitrite: NEGATIVE
Protein, ur: NEGATIVE mg/dL
Specific Gravity, Urine: 1.028 (ref 1.005–1.030)
pH: 5 (ref 5.0–8.0)

## 2019-10-07 LAB — CBC WITH DIFFERENTIAL/PLATELET
Abs Immature Granulocytes: 0.01 10*3/uL (ref 0.00–0.07)
Basophils Absolute: 0 10*3/uL (ref 0.0–0.1)
Basophils Relative: 1 %
Eosinophils Absolute: 0.1 10*3/uL (ref 0.0–0.5)
Eosinophils Relative: 2 %
HCT: 43.2 % (ref 39.0–52.0)
Hemoglobin: 14.2 g/dL (ref 13.0–17.0)
Immature Granulocytes: 0 %
Lymphocytes Relative: 20 %
Lymphs Abs: 1 10*3/uL (ref 0.7–4.0)
MCH: 31.1 pg (ref 26.0–34.0)
MCHC: 32.9 g/dL (ref 30.0–36.0)
MCV: 94.7 fL (ref 80.0–100.0)
Monocytes Absolute: 0.6 10*3/uL (ref 0.1–1.0)
Monocytes Relative: 11 %
Neutro Abs: 3.3 10*3/uL (ref 1.7–7.7)
Neutrophils Relative %: 66 %
Platelets: 149 10*3/uL — ABNORMAL LOW (ref 150–400)
RBC: 4.56 MIL/uL (ref 4.22–5.81)
RDW: 14 % (ref 11.5–15.5)
WBC: 5 10*3/uL (ref 4.0–10.5)
nRBC: 0 % (ref 0.0–0.2)

## 2019-10-07 LAB — ABO/RH: ABO/RH(D): A POS

## 2019-10-07 LAB — PROTIME-INR
INR: 1.2 (ref 0.8–1.2)
Prothrombin Time: 14.7 seconds (ref 11.4–15.2)

## 2019-10-07 LAB — SARS CORONAVIRUS 2 (TAT 6-24 HRS): SARS Coronavirus 2: NEGATIVE

## 2019-10-07 LAB — BASIC METABOLIC PANEL
Anion gap: 8 (ref 5–15)
BUN: 22 mg/dL (ref 8–23)
CO2: 27 mmol/L (ref 22–32)
Calcium: 9.6 mg/dL (ref 8.9–10.3)
Chloride: 107 mmol/L (ref 98–111)
Creatinine, Ser: 0.96 mg/dL (ref 0.61–1.24)
GFR calc Af Amer: >60 mL/min (ref >60)
GFR calc non Af Amer: >60 mL/min (ref >60)
Glucose, Bld: 111 mg/dL — ABNORMAL HIGH (ref 70–99)
Potassium: 4.8 mmol/L (ref 3.5–5.1)
Sodium: 142 mmol/L (ref 135–145)

## 2019-10-07 LAB — SURGICAL PCR SCREEN
MRSA, PCR: NEGATIVE
Staphylococcus aureus: NEGATIVE

## 2019-10-07 LAB — APTT: aPTT: 29 seconds (ref 24–36)

## 2019-10-07 NOTE — Care Plan (Signed)
Spoke with patient and wife prior to surgery. He plans to discharge to home with her. He has all needed equipment at home. He is set up for OPPT at [facility] on 10/13/19. Patient and MD in agreement with plan. Choice offered.   Ladell Heads, [place]

## 2019-10-07 NOTE — Progress Notes (Signed)
PCP - Kathlene November Cardiologist -   Chest x-ray -  EKG - 10-07-19 Stress Test -  ECHO -  Cardiac Cath -   Sleep Study -  CPAP -   Fasting Blood Sugar -  Checks Blood Sugar _____ times a day  Blood Thinner Instructions: Aspirin Instructions: Last Dose:  Anesthesia review:   Patient denies shortness of breath, fever, cough and chest pain at PAT appointment   Patient verbalized understanding of instructions that were given to them at the PAT appointment. Patient was also instructed that they will need to review over the PAT instructions again at home before surgery.

## 2019-10-07 NOTE — H&P (Signed)
TOTAL HIP ADMISSION H&P  Patient is admitted for left total hip arthroplasty.  Subjective:  Chief Complaint: left hip pain  HPI: Thomas Bradley, 73 y.o. male, has a history of pain and functional disability in the left hip(s) due to AVN and patient has failed non-surgical conservative treatments for greater than 12 weeks to include NSAID's and/or analgesics, use of assistive devices, weight reduction as appropriate and activity modification.  Onset of symptoms was abrupt starting 1 years ago with rapidlly worsening course since that time.The patient noted no past surgery on the left hip(s).  Patient currently rates pain in the left hip at 10 out of 10 with activity. Patient has night pain, worsening of pain with activity and weight bearing, trendelenberg gait, pain that interfers with activities of daily living and pain with passive range of motion. Patient has evidence of subchondral cysts, joint space narrowing and AVN by imaging studies. This condition presents safety issues increasing the risk of falls.   There is no current active infection.  Patient Active Problem List   Diagnosis Date Noted  . PCP NOTES >>>>>>>>>>>>>>>>> 02/01/2016  . Hyperglycemia 05/01/2015  . Rhinitis 08/30/2012  . DJD (degenerative joint disease) 03/08/2012  . Annual physical exam 01/28/2011  . INGUINAL HERNIA, RIGHT 01/23/2009  . GERD 09/20/2008  . Dyslipidemia 03/08/2007  . Essential hypertension 03/08/2007  . BPH (benign prostatic hyperplasia) 03/08/2007   Past Medical History:  Diagnosis Date  . Benign prostatic hypertrophy    ED, sees urology every year  . BPH (benign prostatic hypertrophy) with urinary obstruction    Dr. Risa Grill  . Elevated PSA    Dr. Risa Grill  . Erectile dysfunction due to arterial insufficiency    Dr. Risa Grill  . Erythema multiforme    dx 2013   . GERD (gastroesophageal reflux disease)   . Hyperlipemia   . Hypertension   . Nocturia   . Prostatitis    2010, again 09-2011    Past  Surgical History:  Procedure Laterality Date  . HERNIA REPAIR  07-2010   open, R inguinal w/ mesh    No current facility-administered medications for this encounter.   Current Outpatient Medications  Medication Sig Dispense Refill Last Dose  . amLODipine (NORVASC) 10 MG tablet Take 1 tablet (10 mg total) by mouth daily. 90 tablet 1   . Cholecalciferol (VITAMIN D3) 2000 UNITS TABS Take 1 tablet by mouth daily. Reported on 02/01/2016     . Cyanocobalamin (B-12) 2500 MCG TABS Take 1 tablet by mouth daily. Reported on 02/01/2016     . doxazosin (CARDURA) 4 MG tablet Take 1 tablet (4 mg total) by mouth daily. 90 tablet 1   . Multiple Vitamin (MULTIVITAMIN) capsule Take 1 capsule by mouth daily. Reported on 02/01/2016     . pravastatin (PRAVACHOL) 40 MG tablet Take 1 tablet (40 mg total) by mouth at bedtime. 90 tablet 1   . vitamin E 400 UNIT capsule Take 400 mg by mouth daily.       No Known Allergies  Social History   Tobacco Use  . Smoking status: Former Smoker    Types: Cigarettes    Quit date: 11/13/1976    Years since quitting: 42.9  . Smokeless tobacco: Never Used  Substance Use Topics  . Alcohol use: No    Family History  Problem Relation Age of Onset  . Coronary artery disease Father 35  . Hypertension Sister   . Diabetes Brother   . Stroke Neg Hx   .  Colon cancer Neg Hx   . Prostate cancer Neg Hx      Review of Systems  Constitutional: Negative.   HENT: Negative.   Eyes: Negative.   Respiratory: Negative.   Cardiovascular:       HTN  Gastrointestinal: Negative.   Endocrine: Negative.   Genitourinary:       ED and enlarged prostate  Musculoskeletal: Positive for arthralgias.  Skin: Negative.   Allergic/Immunologic: Negative.   Neurological: Negative.   Hematological: Negative.   Psychiatric/Behavioral: Negative.     Objective:  Physical Exam  Constitutional: He is oriented to person, place, and time. He appears well-developed and well-nourished.  HENT:   Head: Normocephalic and atraumatic.  Eyes: Pupils are equal, round, and reactive to light.  Cardiovascular: Intact distal pulses.  Respiratory: Effort normal.  Musculoskeletal:        General: Tenderness present.     Cervical back: Normal range of motion and neck supple.     Comments: Patient walks with a profound left-sided limp internal rotation of his left hip reproduces his pain at 0 as does external rotation foot tap is mildly positive.  Neurovascular intact distally toes are pink and well perfused normal pulses skin is intact no cuts scrapes or abrasions.  Good power to testing of the abductors and abductors the flexors are relatively weak secondary to severe pain when he fires his rectus muscle.    Neurological: He is alert and oriented to person, place, and time.  Skin: Skin is warm and dry.  Psychiatric: He has a normal mood and affect. His behavior is normal. Judgment and thought content normal.    Vital signs in last 24 hours: Temp:  [97.6 F (36.4 C)] 97.6 F (36.4 C) (12/11 1131) Pulse Rate:  [74] 74 (12/11 1131) Resp:  [18] 18 (12/11 1131) BP: (130)/(78) 130/78 (12/11 1131) SpO2:  [99 %] 99 % (12/11 1131) Weight:  [95.9 kg] 95.9 kg (12/11 1131)  Labs:   Estimated body mass index is 27.14 kg/m as calculated from the following:   Height as of 10/07/19: 6\' 2"  (1.88 m).   Weight as of 10/07/19: 95.9 kg.   Imaging Review Plain radiographs demonstrate  AP pelvis and crosstable lateral shows marked progression of the avascular necrosis on the left side with partial collapse of the femoral head and very impressive subchondral cysts in both the acetabulum and femur.   Assessment/Plan:  Progressive avascular necrosis of the left hip with collapse of the femoral head and impressive subchondral cysts throughout the femoral head   The patient history, physical examination, clinical judgement of the provider and imaging studies are consistent with end stage degenerative  joint disease of the left hip(s) and total hip arthroplasty is deemed medically necessary. The treatment options including medical management, injection therapy, arthroscopy and arthroplasty were discussed at length. The risks and benefits of total hip arthroplasty were presented and reviewed. The risks due to aseptic loosening, infection, stiffness, dislocation/subluxation,  thromboembolic complications and other imponderables were discussed.  The patient acknowledged the explanation, agreed to proceed with the plan and consent was signed. Patient is being admitted for inpatient treatment for surgery, pain control, PT, OT, prophylactic antibiotics, VTE prophylaxis, progressive ambulation and ADL's and discharge planning.The patient is planning to be discharged home with home health services    Patient's anticipated LOS is less than 2 midnights, meeting these requirements: - Younger than 78 - Lives within 1 hour of care - Has a competent adult at home to recover  with post-op recover - NO history of  - Chronic pain requiring opiods  - Diabetes  - Coronary Artery Disease  - Heart failure  - Heart attack  - Stroke  - DVT/VTE  - Cardiac arrhythmia  - Respiratory Failure/COPD  - Renal failure  - Anemia  - Advanced Liver disease

## 2019-10-07 NOTE — Patient Instructions (Addendum)
DUE TO COVID-19 ONLY ONE VISITOR IS ALLOWED TO COME WITH YOU AND STAY IN THE WAITING ROOM ONLY DURING PRE OP AND PROCEDURE DAY OF SURGERY. THE 1 VISITOR MAY VISIT WITH YOU AFTER SURGERY IN YOUR PRIVATE ROOM DURING VISITING HOURS ONLY!  YOU NEED TO HAVE A COVID 19 TEST ON 10-07-19 @ 12:35 PM, THIS TEST MUST BE DONE BEFORE SURGERY, COME  [name], [name] , [phone].  ([place]) ONCE YOUR COVID TEST IS COMPLETED, PLEASE BEGIN THE QUARANTINE INSTRUCTIONS AS OUTLINED IN YOUR HANDOUT.                Thomas Bradley  10/07/2019   Your procedure is scheduled on: 10-10-19    Report to [facility] Main  Entrance    Report to Admitting at  7:10 AM    Call this number if you have problems the morning of surgery [phone]    Remember: NO SOLID FOOD AFTER MIDNIGHT THE NIGHT PRIOR TO SURGERY. NOTHING BY MOUTH EXCEPT CLEAR LIQUIDS UNTIL 6:40 AM . PLEASE FINISH ENSURE DRINK PER SURGEON ORDER  WHICH NEEDS TO BE COMPLETED AT 6:40 AM.   CLEAR LIQUID DIET   Foods Allowed                                                                     Foods Excluded  Coffee and tea, regular and decaf                             liquids that you cannot  Plain Jell-O any favor except red or purple                                           see through such as: Fruit ices (not with fruit pulp)                                     milk, soups, orange juice  Iced Popsicles                                    All solid food Carbonated beverages, regular and diet                                    Cranberry, grape and apple juices Sports drinks like Gatorade Lightly seasoned clear broth or consume(fat free) Sugar, honey syrup   _____________________________________________________________________     Take these medicines the morning of surgery with A SIP OF WATER: Amlodipine (Norvasc), and Doxazosin (Cardura)  BRUSH YOUR TEETH MORNING OF SURGERY AND RINSE YOUR MOUTH OUT, NO  CHEWING GUM CANDY OR MINTS.                                You may not have any metal on your  body including hair pins and              piercings    Do not wear jewelry, cologne, lotions, powders or deodorant             Men may shave face and neck.   Do not bring valuables to the hospital. [place].  Contacts, dentures or bridgework may not be worn into surgery.  You may bring an overnight bag   Special Instructions: N/A              Please read over the following fact sheets you were given: _____________________________________________________________________             [facility] - Preparing for Surgery Before surgery, you can play an important role.  Because skin is not sterile, your skin needs to be as free of germs as possible.  You can reduce the number of germs on your skin by washing with CHG (chlorahexidine gluconate) soap before surgery.  CHG is an antiseptic cleaner which kills germs and bonds with the skin to continue killing germs even after washing. Please DO NOT use if you have an allergy to CHG or antibacterial soaps.  If your skin becomes reddened/irritated stop using the CHG and inform your nurse when you arrive at Short Stay. Do not shave (including legs and underarms) for at least 48 hours prior to the first CHG shower.  You may shave your face/neck. Please follow these instructions carefully:  1.  Shower with CHG Soap the night before surgery and the  morning of Surgery.  2.  If you choose to wash your hair, wash your hair first as usual with your  normal  shampoo.  3.  After you shampoo, rinse your hair and body thoroughly to remove the  shampoo.                           4.  Use CHG as you would any other liquid soap.  You can apply chg directly  to the skin and wash                       Gently with a scrungie or clean washcloth.  5.  Apply the CHG Soap to your body ONLY FROM THE NECK DOWN.   Do not use on  face/ open                           Wound or open sores. Avoid contact with eyes, ears mouth and genitals (private parts).                       Wash face,  Genitals (private parts) with your normal soap.             6.  Wash thoroughly, paying special attention to the area where your surgery  will be performed.  7.  Thoroughly rinse your body with warm water from the neck down.  8.  DO NOT shower/wash with your normal soap after using and rinsing off  the CHG Soap.                9.  Pat yourself dry with a clean towel.  10.  Wear clean pajamas.            11.  Place clean sheets on your bed the night of your first shower and do not  sleep with pets. Day of Surgery : Do not apply any lotions/deodorants the morning of surgery.  Please wear clean clothes to the hospital/surgery center.  FAILURE TO FOLLOW THESE INSTRUCTIONS MAY RESULT IN THE CANCELLATION OF YOUR SURGERY PATIENT SIGNATURE_________________________________  NURSE SIGNATURE__________________________________  ________________________________________________________________________   [name]  An incentive spirometer is a tool that can help keep your lungs clear and active. This tool measures how well you are filling your lungs with each breath. Taking long deep breaths may help reverse or decrease the chance of developing breathing (pulmonary) problems (especially infection) following:  A long period of time when you are unable to move or be active. BEFORE THE PROCEDURE   If the spirometer includes an indicator to show your best effort, your nurse or respiratory therapist will set it to a desired goal.  If possible, sit up straight or lean slightly forward. Try not to slouch.  Hold the incentive spirometer in an upright position. INSTRUCTIONS FOR USE  1. Sit on the edge of your bed if possible, or sit up as far as you can in bed or on a chair. 2. Hold the incentive spirometer in an upright  position. 3. Breathe out normally. 4. Place the mouthpiece in your mouth and seal your lips tightly around it. 5. Breathe in slowly and as deeply as possible, raising the piston or the ball toward the top of the column. 6. Hold your breath for 3-5 seconds or for as long as possible. Allow the piston or ball to fall to the bottom of the column. 7. Remove the mouthpiece from your mouth and breathe out normally. 8. Rest for a few seconds and repeat Steps 1 through 7 at least 10 times every 1-2 hours when you are awake. Take your time and take a few normal breaths between deep breaths. 9. The spirometer may include an indicator to show your best effort. Use the indicator as a goal to work toward during each repetition. 10. After each set of 10 deep breaths, practice coughing to be sure your lungs are clear. If you have an incision (the cut made at the time of surgery), support your incision when coughing by placing a pillow or rolled up towels firmly against it. Once you are able to get out of bed, walk around indoors and cough well. You may stop using the incentive spirometer when instructed by your caregiver.  RISKS AND COMPLICATIONS  Take your time so you do not get dizzy or light-headed.  If you are in pain, you may need to take or ask for pain medication before doing incentive spirometry. It is harder to take a deep breath if you are having pain. AFTER USE  Rest and breathe slowly and easily.  It can be helpful to keep track of a log of your progress. Your caregiver can provide you with a simple table to help with this. If you are using the spirometer at home, follow these instructions: [place] IF:   You are having difficultly using the spirometer.  You have trouble using the spirometer as often as instructed.  Your pain medication is not giving enough relief while using the spirometer.  You develop fever of 100.5 F (38.1 C) or higher. SEEK IMMEDIATE MEDICAL CARE IF:    You cough up bloody  sputum that had not been present before.  You develop fever of 102 F (38.9 C) or greater.  You develop worsening pain at or near the incision site. MAKE SURE YOU:   Understand these instructions.  Will watch your condition.  Will get help right away if you are not doing well or get worse. Document Released: 02/23/2007 Document Revised: 01/05/2012 Document Reviewed: 04/26/2007 [facility] Patient Information 2014 [place], [place].   ________________________________________________________________________

## 2019-10-09 MED ORDER — TRANEXAMIC ACID 1000 MG/10ML IV SOLN
2000.0000 mg | INTRAVENOUS | Status: DC
  Filled 2019-10-09: qty 20

## 2019-10-09 MED ORDER — BUPIVACAINE LIPOSOME 1.3 % IJ SUSP
10.0000 mL | Freq: Once | INTRAMUSCULAR | Status: DC
  Filled 2019-10-09: qty 10

## 2019-10-10 ENCOUNTER — Ambulatory Visit (HOSPITAL_COMMUNITY): Payer: Medicare Other | Admitting: Certified Registered Nurse Anesthetist

## 2019-10-10 ENCOUNTER — Ambulatory Visit (HOSPITAL_COMMUNITY)
Admission: RE | Admit: 2019-10-10 | Discharge: 2019-10-10 | Disposition: A | Discharge status: Home / Self Care | Payer: Medicare Other | Attending: Orthopedic Surgery | Admitting: Orthopedic Surgery

## 2019-10-10 ENCOUNTER — Ambulatory Visit (HOSPITAL_COMMUNITY): Payer: Medicare Other

## 2019-10-10 ENCOUNTER — Encounter (HOSPITAL_COMMUNITY): Payer: Self-pay | Admitting: Orthopedic Surgery

## 2019-10-10 ENCOUNTER — Other Ambulatory Visit: Payer: Self-pay

## 2019-10-10 ENCOUNTER — Encounter (HOSPITAL_COMMUNITY)
Admission: RE | Disposition: A | Discharge status: Home / Self Care | Payer: Self-pay | Source: Home / Self Care | Attending: Orthopedic Surgery

## 2019-10-10 DIAGNOSIS — I10 Essential (primary) hypertension: Secondary | ICD-10-CM | POA: Insufficient documentation

## 2019-10-10 DIAGNOSIS — I700 Atherosclerosis of aorta: Secondary | ICD-10-CM | POA: Insufficient documentation

## 2019-10-10 DIAGNOSIS — E785 Hyperlipidemia, unspecified: Secondary | ICD-10-CM | POA: Insufficient documentation

## 2019-10-10 DIAGNOSIS — Z79899 Other long term (current) drug therapy: Secondary | ICD-10-CM | POA: Insufficient documentation

## 2019-10-10 DIAGNOSIS — K219 Gastro-esophageal reflux disease without esophagitis: Secondary | ICD-10-CM | POA: Diagnosis not present

## 2019-10-10 DIAGNOSIS — Z87891 Personal history of nicotine dependence: Secondary | ICD-10-CM | POA: Diagnosis not present

## 2019-10-10 DIAGNOSIS — M1612 Unilateral primary osteoarthritis, left hip: Secondary | ICD-10-CM | POA: Diagnosis not present

## 2019-10-10 DIAGNOSIS — M1990 Unspecified osteoarthritis, unspecified site: Secondary | ICD-10-CM | POA: Insufficient documentation

## 2019-10-10 DIAGNOSIS — Z7982 Long term (current) use of aspirin: Secondary | ICD-10-CM | POA: Diagnosis not present

## 2019-10-10 DIAGNOSIS — M879 Osteonecrosis, unspecified: Secondary | ICD-10-CM | POA: Insufficient documentation

## 2019-10-10 DIAGNOSIS — Z833 Family history of diabetes mellitus: Secondary | ICD-10-CM | POA: Diagnosis not present

## 2019-10-10 DIAGNOSIS — Z419 Encounter for procedure for purposes other than remedying health state, unspecified: Secondary | ICD-10-CM

## 2019-10-10 DIAGNOSIS — Z8249 Family history of ischemic heart disease and other diseases of the circulatory system: Secondary | ICD-10-CM | POA: Insufficient documentation

## 2019-10-10 DIAGNOSIS — N400 Enlarged prostate without lower urinary tract symptoms: Secondary | ICD-10-CM | POA: Insufficient documentation

## 2019-10-10 DIAGNOSIS — M25752 Osteophyte, left hip: Secondary | ICD-10-CM | POA: Diagnosis not present

## 2019-10-10 DIAGNOSIS — M87059 Idiopathic aseptic necrosis of unspecified femur: Secondary | ICD-10-CM

## 2019-10-10 HISTORY — PX: TOTAL HIP ARTHROPLASTY: SHX124

## 2019-10-10 LAB — TYPE AND SCREEN
ABO/RH(D): A POS
Antibody Screen: NEGATIVE

## 2019-10-10 SURGERY — ARTHROPLASTY, HIP, TOTAL, ANTERIOR APPROACH
Anesthesia: Monitor Anesthesia Care | Site: Hip | Laterality: Left

## 2019-10-10 MED ORDER — METHOCARBAMOL 500 MG PO TABS: 500.0000 mg | ORAL_TABLET | Freq: Four times a day (QID) | ORAL | Status: DC | PRN

## 2019-10-10 MED ORDER — SODIUM CHLORIDE (PF) 0.9 % IJ SOLN
INTRAMUSCULAR | Status: AC
  Filled 2019-10-10: qty 50

## 2019-10-10 MED ORDER — PHENYLEPHRINE 40 MCG/ML (10ML) SYRINGE FOR IV PUSH (FOR BLOOD PRESSURE SUPPORT)
PREFILLED_SYRINGE | INTRAVENOUS | Status: AC
  Filled 2019-10-10: qty 10

## 2019-10-10 MED ORDER — FENTANYL CITRATE (PF) 100 MCG/2ML IJ SOLN
INTRAMUSCULAR | Status: AC
  Filled 2019-10-10: qty 2

## 2019-10-10 MED ORDER — ONDANSETRON HCL 4 MG/2ML IJ SOLN
INTRAMUSCULAR | Status: DC | PRN
  Administered 2019-10-10: 4 mg via INTRAVENOUS

## 2019-10-10 MED ORDER — METHOCARBAMOL 500 MG IVPB - SIMPLE MED
500.0000 mg | Freq: Four times a day (QID) | INTRAVENOUS | Status: DC | PRN
  Administered 2019-10-10: 500 mg via INTRAVENOUS

## 2019-10-10 MED ORDER — METOCLOPRAMIDE HCL 5 MG PO TABS: 5.0000 mg | ORAL_TABLET | Freq: Three times a day (TID) | ORAL | Status: DC | PRN

## 2019-10-10 MED ORDER — OXYCODONE HCL 5 MG PO TABS: 5.0000 mg | ORAL_TABLET | Freq: Once | ORAL | Status: AC | PRN

## 2019-10-10 MED ORDER — ACETAMINOPHEN 325 MG PO TABS: 325.0000 mg | ORAL_TABLET | Freq: Four times a day (QID) | ORAL | Status: DC | PRN

## 2019-10-10 MED ORDER — TIZANIDINE HCL 2 MG PO TABS: 2.0000 mg | ORAL_TABLET | Freq: Four times a day (QID) | ORAL | 0 refills | Status: DC | PRN

## 2019-10-10 MED ORDER — BISACODYL 5 MG PO TBEC: 5.0000 mg | DELAYED_RELEASE_TABLET | Freq: Every day | ORAL | Status: DC | PRN

## 2019-10-10 MED ORDER — DIPHENHYDRAMINE HCL 12.5 MG/5ML PO ELIX: 12.5000 mg | ORAL_SOLUTION | ORAL | Status: DC | PRN

## 2019-10-10 MED ORDER — OXYCODONE HCL 5 MG PO TABS: 5.0000 mg | ORAL_TABLET | ORAL | Status: DC | PRN

## 2019-10-10 MED ORDER — ACETAMINOPHEN 500 MG PO TABS: 1000.0000 mg | ORAL_TABLET | Freq: Once | ORAL | Status: DC | PRN

## 2019-10-10 MED ORDER — POVIDONE-IODINE 10 % EX SWAB
2.0000 "application " | Freq: Once | CUTANEOUS | Status: AC
  Administered 2019-10-10: 2 via TOPICAL

## 2019-10-10 MED ORDER — MEPIVACAINE HCL (PF) 2 % IJ SOLN
INTRAMUSCULAR | Status: AC
  Filled 2019-10-10: qty 20

## 2019-10-10 MED ORDER — KCL IN DEXTROSE-NACL 20-5-0.45 MEQ/L-%-% IV SOLN: INTRAVENOUS | Status: DC

## 2019-10-10 MED ORDER — GABAPENTIN 100 MG PO CAPS: 100.0000 mg | ORAL_CAPSULE | Freq: Three times a day (TID) | ORAL | Status: DC

## 2019-10-10 MED ORDER — ACETAMINOPHEN 500 MG PO TABS: 1000.0000 mg | ORAL_TABLET | Freq: Four times a day (QID) | ORAL | Status: DC

## 2019-10-10 MED ORDER — OXYCODONE HCL 5 MG/5ML PO SOLN: 5.0000 mg | Freq: Once | ORAL | Status: AC | PRN

## 2019-10-10 MED ORDER — BUPIVACAINE HCL (PF) 0.5 % IJ SOLN
INTRAMUSCULAR | Status: AC
  Filled 2019-10-10: qty 30

## 2019-10-10 MED ORDER — TRANEXAMIC ACID-NACL 1000-0.7 MG/100ML-% IV SOLN
1000.0000 mg | Freq: Once | INTRAVENOUS | Status: AC
  Administered 2019-10-10: 1000 mg via INTRAVENOUS
  Filled 2019-10-10: qty 100

## 2019-10-10 MED ORDER — ACETAMINOPHEN 160 MG/5ML PO SOLN: 1000.0000 mg | Freq: Once | ORAL | Status: DC | PRN

## 2019-10-10 MED ORDER — DEXAMETHASONE SODIUM PHOSPHATE 10 MG/ML IJ SOLN: 10.0000 mg | Freq: Once | INTRAMUSCULAR | Status: DC

## 2019-10-10 MED ORDER — METHOCARBAMOL 500 MG IVPB - SIMPLE MED
INTRAVENOUS | Status: AC
  Filled 2019-10-10: qty 50

## 2019-10-10 MED ORDER — PANTOPRAZOLE SODIUM 40 MG PO TBEC: 40.0000 mg | DELAYED_RELEASE_TABLET | Freq: Every day | ORAL | Status: DC

## 2019-10-10 MED ORDER — ASPIRIN EC 81 MG PO TBEC: 81.0000 mg | DELAYED_RELEASE_TABLET | Freq: Two times a day (BID) | ORAL | 0 refills | Status: DC

## 2019-10-10 MED ORDER — FENTANYL CITRATE (PF) 100 MCG/2ML IJ SOLN
INTRAMUSCULAR | Status: DC | PRN
  Administered 2019-10-10 (×2): 50 ug via INTRAVENOUS

## 2019-10-10 MED ORDER — HYDROMORPHONE HCL 1 MG/ML IJ SOLN: 0.5000 mg | INTRAMUSCULAR | Status: DC | PRN

## 2019-10-10 MED ORDER — ACETAMINOPHEN 10 MG/ML IV SOLN
1000.0000 mg | Freq: Once | INTRAVENOUS | Status: DC | PRN
  Administered 2019-10-10: 1000 mg via INTRAVENOUS

## 2019-10-10 MED ORDER — BUPIVACAINE-EPINEPHRINE 0.25% -1:200000 IJ SOLN
INTRAMUSCULAR | Status: DC | PRN
  Administered 2019-10-10: 30 mg

## 2019-10-10 MED ORDER — PHENYLEPHRINE 40 MCG/ML (10ML) SYRINGE FOR IV PUSH (FOR BLOOD PRESSURE SUPPORT)
PREFILLED_SYRINGE | INTRAVENOUS | Status: DC | PRN
  Administered 2019-10-10: 120 ug via INTRAVENOUS
  Administered 2019-10-10 (×2): 80 ug via INTRAVENOUS
  Administered 2019-10-10 (×2): 120 ug via INTRAVENOUS
  Administered 2019-10-10: 40 ug via INTRAVENOUS
  Administered 2019-10-10: 80 ug via INTRAVENOUS

## 2019-10-10 MED ORDER — TRANEXAMIC ACID 1000 MG/10ML IV SOLN
INTRAVENOUS | Status: DC | PRN
  Administered 2019-10-10: 2000 mg via TOPICAL

## 2019-10-10 MED ORDER — CHLORHEXIDINE GLUCONATE 4 % EX LIQD: 60.0000 mL | Freq: Once | CUTANEOUS | Status: DC

## 2019-10-10 MED ORDER — EPHEDRINE 5 MG/ML INJ
INTRAVENOUS | Status: AC
  Filled 2019-10-10: qty 10

## 2019-10-10 MED ORDER — CEFAZOLIN SODIUM-DEXTROSE 2-4 GM/100ML-% IV SOLN
2.0000 g | INTRAVENOUS | Status: AC
  Administered 2019-10-10: 10:00:00 2 g via INTRAVENOUS
  Filled 2019-10-10: qty 100

## 2019-10-10 MED ORDER — CELECOXIB 200 MG PO CAPS: 200.0000 mg | ORAL_CAPSULE | Freq: Two times a day (BID) | ORAL | Status: DC

## 2019-10-10 MED ORDER — MEPIVACAINE HCL (PF) 2 % IJ SOLN
INTRAMUSCULAR | Status: DC | PRN
  Administered 2019-10-10: 50 mg via EPIDURAL

## 2019-10-10 MED ORDER — 0.9 % SODIUM CHLORIDE (POUR BTL) OPTIME
TOPICAL | Status: DC | PRN
  Administered 2019-10-10: 11:00:00 1000 mL

## 2019-10-10 MED ORDER — ALUMINUM HYDROXIDE GEL 320 MG/5ML PO SUSP: 15.0000 mL | ORAL | Status: DC | PRN

## 2019-10-10 MED ORDER — PHENOL 1.4 % MT LIQD: 1.0000 | OROMUCOSAL | Status: DC | PRN

## 2019-10-10 MED ORDER — SODIUM CHLORIDE 0.9% FLUSH
INTRAVENOUS | Status: DC | PRN
  Administered 2019-10-10: 70 mL via TOPICAL

## 2019-10-10 MED ORDER — MENTHOL 3 MG MT LOZG: 1.0000 | LOZENGE | OROMUCOSAL | Status: DC | PRN

## 2019-10-10 MED ORDER — ONDANSETRON HCL 4 MG/2ML IJ SOLN: 4.0000 mg | Freq: Four times a day (QID) | INTRAMUSCULAR | Status: DC | PRN

## 2019-10-10 MED ORDER — PROPOFOL 500 MG/50ML IV EMUL
INTRAVENOUS | Status: AC
  Filled 2019-10-10: qty 50

## 2019-10-10 MED ORDER — BUPIVACAINE HCL (PF) 0.25 % IJ SOLN
INTRAMUSCULAR | Status: AC
  Filled 2019-10-10: qty 30

## 2019-10-10 MED ORDER — ASPIRIN 81 MG PO CHEW: 81.0000 mg | CHEWABLE_TABLET | Freq: Two times a day (BID) | ORAL | Status: DC

## 2019-10-10 MED ORDER — PROPOFOL 500 MG/50ML IV EMUL
INTRAVENOUS | Status: DC | PRN
  Administered 2019-10-10: 100 ug/kg/min via INTRAVENOUS
  Administered 2019-10-10: 30 mg via INTRAVENOUS

## 2019-10-10 MED ORDER — LACTATED RINGERS IV SOLN
INTRAVENOUS | Status: DC
  Administered 2019-10-10: 08:00:00 via INTRAVENOUS

## 2019-10-10 MED ORDER — BUPIVACAINE LIPOSOME 1.3 % IJ SUSP
INTRAMUSCULAR | Status: DC | PRN
  Administered 2019-10-10: 20 mL

## 2019-10-10 MED ORDER — ONDANSETRON HCL 4 MG/2ML IJ SOLN
INTRAMUSCULAR | Status: AC
  Filled 2019-10-10: qty 2

## 2019-10-10 MED ORDER — OXYCODONE-ACETAMINOPHEN 5-325 MG PO TABS: 1.0000 | ORAL_TABLET | ORAL | 0 refills | Status: DC | PRN

## 2019-10-10 MED ORDER — POLYETHYLENE GLYCOL 3350 17 G PO PACK: 17.0000 g | PACK | Freq: Every day | ORAL | Status: DC | PRN

## 2019-10-10 MED ORDER — EPHEDRINE SULFATE-NACL 50-0.9 MG/10ML-% IV SOSY
PREFILLED_SYRINGE | INTRAVENOUS | Status: DC | PRN
  Administered 2019-10-10: 5 mg via INTRAVENOUS
  Administered 2019-10-10: 10 mg via INTRAVENOUS
  Administered 2019-10-10 (×4): 5 mg via INTRAVENOUS

## 2019-10-10 MED ORDER — METOCLOPRAMIDE HCL 5 MG/ML IJ SOLN: 5.0000 mg | Freq: Three times a day (TID) | INTRAMUSCULAR | Status: DC | PRN

## 2019-10-10 MED ORDER — TRANEXAMIC ACID-NACL 1000-0.7 MG/100ML-% IV SOLN
1000.0000 mg | INTRAVENOUS | Status: AC
  Administered 2019-10-10: 10:00:00 1000 mg via INTRAVENOUS
  Filled 2019-10-10: qty 100

## 2019-10-10 MED ORDER — FLEET ENEMA 7-19 GM/118ML RE ENEM: 1.0000 | ENEMA | Freq: Once | RECTAL | Status: DC | PRN

## 2019-10-10 MED ORDER — FENTANYL CITRATE (PF) 100 MCG/2ML IJ SOLN
25.0000 ug | INTRAMUSCULAR | Status: DC | PRN
  Administered 2019-10-10 (×3): 50 ug via INTRAVENOUS

## 2019-10-10 MED ORDER — OXYCODONE HCL 5 MG PO TABS
ORAL_TABLET | ORAL | Status: AC
  Administered 2019-10-10: 14:00:00 5 mg via ORAL
  Filled 2019-10-10: qty 1

## 2019-10-10 MED ORDER — ONDANSETRON HCL 4 MG PO TABS: 4.0000 mg | ORAL_TABLET | Freq: Four times a day (QID) | ORAL | Status: DC | PRN

## 2019-10-10 MED ORDER — ACETAMINOPHEN 10 MG/ML IV SOLN
INTRAVENOUS | Status: AC
  Filled 2019-10-10: qty 100

## 2019-10-10 MED ORDER — DOCUSATE SODIUM 100 MG PO CAPS: 100.0000 mg | ORAL_CAPSULE | Freq: Two times a day (BID) | ORAL | Status: DC

## 2019-10-10 SURGICAL SUPPLY — 44 items
BAG DECANTER FOR FLEXI CONT (MISCELLANEOUS) ×3 IMPLANT
BLADE SAW SGTL 18X1.27X75 (BLADE) ×2 IMPLANT
BLADE SAW SGTL 18X1.27X75MM (BLADE) ×1
BLADE SURG SZ10 CARB STEEL (BLADE) ×6 IMPLANT
COVER PERINEAL POST (MISCELLANEOUS) ×3 IMPLANT
COVER WAND RF STERILE (DRAPES) ×3 IMPLANT
CUP ACETBLR 54 OD 100 SERIES (Hips) ×2 IMPLANT
DECANTER SPIKE VIAL GLASS SM (MISCELLANEOUS) ×6 IMPLANT
DRAPE STERI IOBAN 125X83 (DRAPES) ×3 IMPLANT
DRAPE U-SHAPE 47X51 STRL (DRAPES) ×6 IMPLANT
DRSG AQUACEL AG ADV 3.5X10 (GAUZE/BANDAGES/DRESSINGS) ×3 IMPLANT
DURAPREP 26ML APPLICATOR (WOUND CARE) ×3 IMPLANT
ELECT BLADE TIP CTD 4 INCH (ELECTRODE) ×3 IMPLANT
ELECT REM PT RETURN 15FT ADLT (MISCELLANEOUS) ×3 IMPLANT
ELIMINATOR HOLE APEX DEPUY (Hips) ×2 IMPLANT
GLOVE BIO SURGEON STRL SZ7.5 (GLOVE) ×3 IMPLANT
GLOVE BIO SURGEON STRL SZ8.5 (GLOVE) ×3 IMPLANT
GLOVE BIOGEL PI IND STRL 8 (GLOVE) ×1 IMPLANT
GLOVE BIOGEL PI IND STRL 9 (GLOVE) ×1 IMPLANT
GLOVE BIOGEL PI INDICATOR 8 (GLOVE) ×2
GLOVE BIOGEL PI INDICATOR 9 (GLOVE) ×2
GOWN STRL REUS W/TWL XL LVL3 (GOWN DISPOSABLE) ×6 IMPLANT
HEAD CERAMIC DELTA 36 PLUS 1.5 (Hips) ×2 IMPLANT
KIT TURNOVER KIT A (KITS) IMPLANT
LINER NEUTRAL 54X36MM PLUS 4 (Hips) ×2 IMPLANT
MANIFOLD NEPTUNE II (INSTRUMENTS) ×3 IMPLANT
NDL HYPO 21X1.5 SAFETY (NEEDLE) ×2 IMPLANT
NEEDLE HYPO 21X1.5 SAFETY (NEEDLE) ×6 IMPLANT
NS IRRIG 1000ML POUR BTL (IV SOLUTION) ×3 IMPLANT
PACK ANTERIOR HIP CUSTOM (KITS) ×3 IMPLANT
PENCIL SMOKE EVACUATOR (MISCELLANEOUS) IMPLANT
STEM FEM ACTIS HIGH SZ10 (Stem) ×2 IMPLANT
SUT ETHIBOND NAB CT1 #1 30IN (SUTURE) ×3 IMPLANT
SUT VIC AB 0 CT1 27 (SUTURE) ×3
SUT VIC AB 0 CT1 27XBRD ANBCTR (SUTURE) ×1 IMPLANT
SUT VIC AB 1 CTX 36 (SUTURE) ×3
SUT VIC AB 1 CTX36XBRD ANBCTR (SUTURE) ×1 IMPLANT
SUT VIC AB 2-0 CT1 27 (SUTURE) ×3
SUT VIC AB 2-0 CT1 TAPERPNT 27 (SUTURE) ×1 IMPLANT
SUT VIC AB 3-0 CT1 27 (SUTURE) ×3
SUT VIC AB 3-0 CT1 TAPERPNT 27 (SUTURE) ×1 IMPLANT
SYR CONTROL 10ML LL (SYRINGE) ×9 IMPLANT
TRAY FOLEY MTR SLVR 16FR STAT (SET/KITS/TRAYS/PACK) IMPLANT
YANKAUER SUCT BULB TIP 10FT TU (MISCELLANEOUS) ×3 IMPLANT

## 2019-10-10 NOTE — Anesthesia Preprocedure Evaluation (Addendum)
Anesthesia Evaluation  Patient identified by MRN, date of birth, ID band Patient awake    Reviewed: Allergy & Precautions, NPO status , Patient's Chart, lab work & pertinent test results  History of Anesthesia Complications Negative for: history of anesthetic complications  Airway Mallampati: II  TM Distance: >3 FB Neck ROM: Full    Dental  (+) Dental Advisory Given, Upper Dentures   Pulmonary neg recent URI, former smoker,    breath sounds clear to auscultation       Cardiovascular hypertension, Pt. on medications (-) angina(-) Past MI and (-) CHF  Rhythm:Regular     Neuro/Psych negative neurological ROS  negative psych ROS   GI/Hepatic Neg liver ROS, GERD  Controlled,  Endo/Other  negative endocrine ROS  Renal/GU negative Renal ROS     Musculoskeletal  (+) Arthritis ,   Abdominal   Peds  Hematology negative hematology ROS (+)   Anesthesia Other Findings   Reproductive/Obstetrics                            Anesthesia Physical Anesthesia Plan  ASA: II  Anesthesia Plan: MAC and Spinal   Post-op Pain Management:    Induction:   PONV Risk Score and Plan: 1 and Treatment may vary due to age or medical condition and Propofol infusion  Airway Management Planned: Nasal Cannula  Additional Equipment: None  Intra-op Plan:   Post-operative Plan:   Informed Consent: I have reviewed the patients History and Physical, chart, labs and discussed the procedure including the risks, benefits and alternatives for the proposed anesthesia with the patient or authorized representative who has indicated his/her understanding and acceptance.     Dental advisory given  Plan Discussed with: CRNA and Surgeon  Anesthesia Plan Comments:         Anesthesia Quick Evaluation

## 2019-10-10 NOTE — Interval H&P Note (Signed)
History and Physical Interval Note:  10/10/2019 9:16 AM  Thomas Bradley  has presented today for surgery, with the diagnosis of LEFT HIP AVASCULAR NECROSIS.  The various methods of treatment have been discussed with the patient and family. After consideration of risks, benefits and other options for treatment, the patient has consented to  Procedure(s): LEFT TOTAL HIP ARTHROPLASTY ANTERIOR APPROACH (Left) as a surgical intervention.  The patient's history has been reviewed, patient examined, no change in status, stable for surgery.  I have reviewed the patient's chart and labs.  Questions were answered to the patient's satisfaction.     Kerin Salen

## 2019-10-10 NOTE — Transfer of Care (Signed)
Immediate Anesthesia Transfer of Care Note  Patient: Thomas Bradley  Procedure(s) Performed: LEFT TOTAL HIP ARTHROPLASTY ANTERIOR APPROACH (Left Hip)  Patient Location: PACU  Anesthesia Type:Spinal  Level of Consciousness: awake, alert , oriented and patient cooperative  Airway & Oxygen Therapy: Patient Spontanous Breathing and Patient connected to face mask  Post-op Assessment: Report given to RN and Post -op Vital signs reviewed and stable  Post vital signs: Reviewed and stable  Last Vitals:  Vitals Value Taken Time  BP 100/68 10/10/19 1156  Temp    Pulse 77 10/10/19 1157  Resp 17 10/10/19 1157  SpO2 97 % 10/10/19 1157  Vitals shown include unvalidated device data.  Last Pain:  Vitals:   10/10/19 0753  TempSrc: Oral  PainSc:          Complications: No apparent anesthesia complications

## 2019-10-10 NOTE — Op Note (Signed)
PATIENT ID:      Thomas Bradley  MRN:     [id number] DOB/AGE:    06/30/1946 / 73 y.o.  OPERATIVE REPORT   DATE OF PROCEDURE:  10/10/2019      PREOPERATIVE DIAGNOSIS:  LEFT HIP AVASCULAR NECROSIS                                                         POSTOPERATIVE DIAGNOSIS:  Same                                                         PROCEDURE: Anterior L total hip arthroplasty using a 54 mm DePuy Pinnacle  Cup, [employer], 0-degree polyethylene liner, a +1.5 mm x 60mm 36 head, a 10hi Depuy Actis stem  SURGEON: Kerin Salen  ASSISTANT:   Kerry Hough. [employer]  (present throughout entire procedure and necessary for timely completion of the procedure)   ANESTHESIA: Spinal, Exparel 133mg  injection BLOOD LOSS: 300 cc FLUID REPLACEMENT: 1600 cc crystalloid TRANEXAMIC ACID: 1gm IV, 2gm Topical COMPLICATIONS: none    INDICATIONS FOR PROCEDURE: A 73 y.o. year-old With  LEFT HIP AVASCULAR NECROSIS   for 2 years, x-rays show bone-on-bone arthritic changes, and osteophytes. Despite conservative measures with observation, anti-inflammatory medicine, narcotics, use of a cane, has severe unremitting pain and can ambulate only a few blocks before resting. Patient desires elective L total hip arthroplasty to decrease pain and increase function. The risks, benefits, and alternatives were discussed at length including but not limited to the risks of infection, bleeding, nerve injury, stiffness, blood clots, the need for revision surgery, cardiopulmonary complications, among others, and they were willing to proceed. Questions answered      PROCEDURE IN DETAIL: The patient was identified by armband,   received preoperative IV antibiotics in the holding area at [facility], taken to the operating room , appropriate anesthetic monitors   were attached and anesthesia was induced with the patient on the gurney. HANA boots were applied to the feet, and the patient  was transferred to the HANA  table with a peroneal post and support underneath the non-operative leg. Theoperative lower extremity was then prepped and draped in the usual sterile fashion from just above the iliac crest to the knee. And a timeout procedure was performed. Skin along incision area was injected with 10 cc of Exparel solution. We then made a 13 cm incision along the interval at the leading edge of the tensor fascia lata of starting at 2 cm lateral to the ASIS. Small bleeders in the skin and subcutaneous tissue identified and cauterized we dissected down to the fascia and made an incision in the fascia allowing [place] to elevate the fascia of the tensor muscle and exploited the interval between the rectus and the tensor fascia lata. A Cobra retractor was then placed along the superior neck of the femur. A cerebellar retractor was used to expose the interval between the tensor fascia lata and the rectus femoris.  We identified and cauterized the ascending branch of the anterior circumflex artery. A second Cobra retractor along the inferior neck of  the femur. A small Hohmann retractor was placed underneath the origin of the rectus femoris, giving [place] good medial exposure. Using Ronguers fatty tissue was removed from in front of the anterior capsule. The capsule was then incised, starting out at the superior anterior rim of the acetabulum going laterally along the anterior neck. The capsule was then teed along the neck superiorly and inferiorly. Electrocautery was used to release capsule from the anterior and medial neck of the femur to allow external rotation. Cobra retractors were then placed along the inferior and superior neck allowing [place] to perform a standard neck cut and removed the femoral head with a power corkscrew. We then placed a medium bent homan retractor in the cotyloid notch and posteriorly along the acetabular rim a narrow Cobra retractor. Exposed labral tissue and osteophytes were then removed. We then sequentially reamed  up to a 53 mm basket reamer obtaining good coverage in all quadrants, verified by C-arm imaging. Under C-arm control we then hammered into place a 54 mm Pinnacle cup in 45 of abduction and 15 of anteversion. The cup seated nicely and required no supplemental screws. We then placed a central hole Eliminator and a 0 polyethylene liner. The foot was then externally rotated to 130-140. The limb was extended and adducted to the floor, delivering the proximal femur up into the wound. A medium curved Hohmann retractor was placed over the greater trochanter and a long Homan retractor along the posterior femoral neck completing the exposure and lateralizing the femur. We then performed releases superiorly and and inferiorly of the capsule going back to the pirformis fossa superiorly and to the lesser trochanter inferiorly. We then entered the proximal femur with the box cutting offset chisel followed by, a canal sounder, the chili pepper and broaching up to a 10 broach. This seated nicely and we reamed the calcar. A trial reduction was performed with a 1.5 mm X 36 mm head.The limb lengths were excellent the hip was stable in 90 of external rotation. At this point the trial components removed and we hammered into place a # 10 hi  Offset Actis stem with Gryption coating. A + 1.5 mm x 36 head was then hammered into place. The hip was reduced and final C-arm images obtained. The wound was thoroughly irrigated with normal saline solution. We repaired the ant capsule and the tensor fascia lot a with running 0 vicryl suture. the subcutaneous tissue was closed with 2-0 and 3-0 Vicryl suture followed by an Aquacil dressing. At this point the patient was awaken and transferred to hospital gurney without difficulty.   Kerin Salen 10/10/2019, 7:18 AM

## 2019-10-10 NOTE — Discharge Instructions (Signed)

## 2019-10-10 NOTE — Anesthesia Procedure Notes (Signed)
Spinal  Patient location during procedure: OR Start time: 10/10/2019 10:00 AM End time: 10/10/2019 10:04 AM Staffing Performed: resident/CRNA  Resident/CRNA: Claudia Desanctis, CRNA Preanesthetic Checklist Completed: patient identified, IV checked, site marked, risks and benefits discussed, surgical consent, monitors and equipment checked, pre-op evaluation and timeout performed Spinal Block Patient position: sitting Prep: DuraPrep Patient monitoring: heart rate, cardiac monitor, continuous pulse ox and blood pressure Approach: midline Location: L3-4 Injection technique: single-shot Needle Needle type: Sprotte and Pencan  Needle gauge: 24 G Needle length: 10 cm Needle insertion depth: 8.5 cm Assessment Sensory level: T4 Additional Notes Expiration dates checked, positive csf at beginning and end of local injection

## 2019-10-10 NOTE — Discharge Summary (Signed)
Patient ID: Thomas Bradley MRN: [id number] DOB/AGE: Jul 20, 1946 73 y.o.  Admit date: 10/10/2019 Discharge date: 10/10/2019  Admission Diagnoses:  Principal Problem:   AVN of femur [facility])   Discharge Diagnoses:  Same  Past Medical History:  Diagnosis Date  . Benign prostatic hypertrophy    ED, sees urology every year  . BPH (benign prostatic hypertrophy) with urinary obstruction    Dr. Risa Grill  . Elevated PSA    Dr. Risa Grill  . Erectile dysfunction due to arterial insufficiency    Dr. Risa Grill  . Erythema multiforme    dx 2013   . GERD (gastroesophageal reflux disease)   . Hyperlipemia   . Hypertension   . Nocturia   . Prostatitis    2010, again 09-2011    Surgeries: Procedure(s): LEFT TOTAL HIP ARTHROPLASTY ANTERIOR APPROACH on 10/10/2019   Consultants:   Discharged Condition: Improved  Hospital Course: Thomas Bradley is an 73 y.o. male who was admitted 10/10/2019 for operative treatment ofAVN of femur ([place]). Patient has severe unremitting pain that affects sleep, daily activities, and work/hobbies. After pre-op clearance the patient was taken to the operating room on 10/10/2019 and underwent  Procedure(s): LEFT TOTAL HIP ARTHROPLASTY ANTERIOR APPROACH.    Patient was given perioperative antibiotics:  Anti-infectives (From admission, onward)   Start     Dose/Rate Route Frequency Ordered Stop   10/10/19 0745  ceFAZolin (ANCEF) IVPB 2g/100 mL premix     2 g 200 mL/hr over 30 Minutes Intravenous On call to O.R. 10/10/19 [id number] 10/10/19 1012       Patient was given sequential compression devices, early ambulation, and chemoprophylaxis to prevent DVT.  Patient benefited maximally from hospital stay and there were no complications.    Recent vital signs:  Patient Vitals for the past 24 hrs:  BP Temp Temp src Pulse Resp SpO2 Height Weight  10/10/19 0753 137/82 98.3 F (36.8 C) Oral 77 17 99 % -- --  10/10/19 0749 -- -- -- -- -- -- 6\' 2"  (1.88 m) 93.9 kg     Recent  laboratory studies:  Recent Labs    10/07/19 1233  WBC 5.0  HGB 14.2  HCT 43.2  PLT 149*  NA 142  K 4.8  CL 107  CO2 27  BUN 22  CREATININE 0.96  GLUCOSE 111*  INR 1.2  CALCIUM 9.6     Discharge Medications:   Allergies as of 10/10/2019   No Known Allergies     Medication List    TAKE these medications   amLODipine 10 MG tablet Commonly known as: NORVASC Take 1 tablet (10 mg total) by mouth daily.   aspirin EC 81 MG tablet Take 1 tablet (81 mg total) by mouth 2 (two) times daily.   B-12 2500 MCG Tabs Take 1 tablet by mouth daily. Reported on 02/01/2016   doxazosin 4 MG tablet Commonly known as: CARDURA Take 1 tablet (4 mg total) by mouth daily.   multivitamin capsule Take 1 capsule by mouth daily. Reported on 02/01/2016   oxyCODONE-acetaminophen 5-325 MG tablet Commonly known as: PERCOCET/ROXICET Take 1 tablet by mouth every 4 (four) hours as needed for severe pain.   pravastatin 40 MG tablet Commonly known as: PRAVACHOL Take 1 tablet (40 mg total) by mouth at bedtime.   tiZANidine 2 MG tablet Commonly known as: ZANAFLEX Take 1 tablet (2 mg total) by mouth every 6 (six) hours as needed.   Vitamin D3 50 MCG (2000 UT) Tabs Take 1 tablet by mouth  daily. Reported on 02/01/2016   vitamin E 400 UNIT capsule Take 400 mg by mouth daily.            Discharge Care Instructions  (From admission, onward)         Start     Ordered   10/10/19 0000  Weight bearing as tolerated     10/10/19 1154          Diagnostic Studies: DG Chest 2 View  Result Date: 10/07/2019 CLINICAL DATA:  Evaluation prior 73 year old male under to hip replacement preoperative. EXAM: CHEST - 2 VIEW COMPARISON:  Chest x-ray 12/21/2018. FINDINGS: Lung volumes are normal. No consolidative airspace disease. No pleural effusions. No pneumothorax. No pulmonary nodule or mass noted. Pulmonary vasculature and the cardiomediastinal silhouette are within normal limits. Atherosclerosis in the  thoracic aorta. IMPRESSION: 1.  No radiographic evidence of acute cardiopulmonary disease. 2. Aortic atherosclerosis. Electronically Signed   By: Vinnie Langton M.D.   On: 10/07/2019 16:42   DG C-Arm 1-60 Min-No Report  Result Date: 10/10/2019 Fluoroscopy was utilized by the requesting physician.  No radiographic interpretation.   DG HIP OPERATIVE UNILAT W OR W/O PELVIS LEFT  Result Date: 10/10/2019 CLINICAL DATA:  73 year old male undergoing hip arthroplasty. EXAM: OPERATIVE LEFT HIP (WITH PELVIS IF PERFORMED) 2 VIEWS TECHNIQUE: Fluoroscopic spot image(s) were submitted for interpretation post-operatively. COMPARISON:  None. FLUOROSCOPY TIME:  0 minutes 12 seconds FINDINGS: 2 AP portable fluoroscopic spot views of the left hip and pelvis at 1208 hours. Bipolar left hip arthroplasty hardware in place. Normal AP alignment. No adverse features identified. IMPRESSION: Left hip arthroplasty with no adverse features identified. Electronically Signed   By: Genevie Ann M.D.   On: 10/10/2019 11:27    Disposition: Discharge disposition: 01-Home or Self Care       Discharge Instructions    Call MD / Call 911   Complete by: As directed    If you experience chest pain or shortness of breath, CALL 911 and be transported to the hospital emergency room.  If you develope a fever above 101 F, pus (white drainage) or increased drainage or redness at the wound, or calf pain, call your surgeon's office.   Constipation Prevention   Complete by: As directed    Drink plenty of fluids.  Prune juice may be helpful.  You may use a stool softener, such as Colace (over the counter) 100 mg twice a day.  Use MiraLax (over the counter) for constipation as needed.   Diet - low sodium heart healthy   Complete by: As directed    Driving restrictions   Complete by: As directed    No driving for 2 weeks   Increase activity slowly as tolerated   Complete by: As directed    Patient may shower   Complete by: As directed     You may shower without a dressing once there is no drainage.  Do not wash over the wound.  If drainage remains, cover wound with plastic wrap and then shower.   Weight bearing as tolerated   Complete by: As directed       Follow-up Information    Frederik Pear, MD. Go on 10/18/2019.   Specialty: Orthopedic Surgery Why: Your appointment has been scheduled for 10:00.  Contact information: [place] [address] [phone]        [facility], [place]. Go on 10/13/2019.   Why: You are scheduled to start outpatient physical therapy at 10:40. Please arrive  at 10:20 to complete your paperwork.  Contact information: Physical Therapy [place] [address] [phone]        Frederik Pear, MD In 2 weeks.   Specialty: Orthopedic Surgery Contact information: [place] [address] [phone]            Signed: Joanell Rising 10/10/2019, 11:55 AM

## 2019-10-10 NOTE — Evaluation (Signed)
Physical Therapy Treatment Patient Details Name: Thomas Bradley MRN: [id number] DOB: 12-07-1945 Today's Date: 10/10/2019    History of Present Illness s/p L DA THA. PMH: HTN    PT Comments    Patient evaluated by Physical Therapy with no further acute PT needs identified. All education has been completed and the patient has no further questions.  See below for any follow-up Physical Therapy or equipment needs. PT is signing off. Thank you for this referral.    Follow Up Recommendations  Follow surgeon's recommendation for DC plan and follow-up therapies     Equipment Recommendations  None recommended by PT    Recommendations for Other Services       Precautions / Restrictions Precautions Precautions: Fall Restrictions Weight Bearing Restrictions: No Other Position/Activity Restrictions: WBAT    Mobility  Bed Mobility Overal bed mobility: Needs Assistance Bed Mobility: Supine to Sit     Supine to sit: Supervision     General bed mobility comments: for safety  Transfers Overall transfer level: Needs assistance Equipment used: Rolling walker (2 wheeled) Transfers: Sit to/from Stand Sit to Stand: Min guard         General transfer comment: cues for hand placement  Ambulation/Gait Ambulation/Gait assistance: Min guard;Supervision Gait Distance (Feet): 120 Feet Assistive device: Rolling walker (2 wheeled) Gait Pattern/deviations: Step-to pattern;Decreased stance time - left;Step-through pattern;Decreased stride length     General Gait Details: cues for RW positioning, step length, safety. discussed picking up standard walker that pt has at home (vs pushing the RW).   Stairs Stairs: Yes Stairs assistance: Min guard Stair Management: One rail Right;Step to pattern;Sideways Number of Stairs: 3 General stair comments: cues for technique and safety   Wheelchair Mobility    Modified Rankin (Stroke Patients Only)       Balance                                             Cognition Arousal/Alertness: Awake/alert Behavior During Therapy: WFL for tasks assessed/performed Overall Cognitive Status: Within Functional Limits for tasks assessed                                        Exercises Total Joint Exercises Ankle Circles/Pumps: AROM;Both;10 reps Quad Sets: 10 reps;Both;AROM Short Arc Quad: AROM;Left;10 reps Heel Slides: 10 reps;Left;AROM;AAROM Hip ABduction/ADduction: AAROM;AROM;Left;10 reps    General Comments        Pertinent Vitals/Pain Pain Assessment: 0-10 Pain Score: 2  Pain Location: left hip Pain Descriptors / Indicators: Discomfort Pain Intervention(s): Monitored during session;Limited activity within patient's tolerance;Premedicated before session    Home Living Family/patient expects to be discharged to:: Private residence   Available Help at Discharge: Family Type of Home: House Home Access: Stairs to enter Entrance Stairs-Rails: Right Home Layout: One level Home Equipment: Environmental consultant - standard      Prior Function Level of Independence: Independent          PT Goals (current goals can now be found in the care plan section) Acute Rehab PT Goals Patient Stated Goal: home today PT Goal Formulation: All assessment and education complete, DC therapy    Frequency           PT Plan      Co-evaluation  AM-PAC PT "6 Clicks" Mobility   Outcome Measure  Help needed turning from your back to your side while in a flat bed without using bedrails?: A Little Help needed moving from lying on your back to sitting on the side of a flat bed without using bedrails?: A Little Help needed moving to and from a bed to a chair (including a wheelchair)?: A Little Help needed standing up from a chair using your arms (e.g., wheelchair or bedside chair)?: A Little Help needed to walk in hospital room?: A Little Help needed climbing 3-5 steps with a railing? : A  Little 6 Click Score: 18    End of Session Equipment Utilized During Treatment: Gait belt Activity Tolerance: Patient tolerated treatment well Patient left: with call bell/phone within reach;in bed Nurse Communication: Mobility status PT Visit Diagnosis: Difficulty in walking, not elsewhere classified (R26.2)     Time: [id number] PT Time Calculation (min) (ACUTE ONLY): 27 min  Charges:  $Gait Training: 8-22 mins                     Baxter Flattery, PT   Acute Rehab Dept [facility]): [id number]   10/10/2019    [facility] 10/10/2019, 3:25 PM

## 2019-10-11 ENCOUNTER — Encounter: Payer: Self-pay | Admitting: *Deleted

## 2019-10-12 ENCOUNTER — Encounter: Payer: Self-pay | Admitting: Anesthesiology

## 2019-10-12 NOTE — Anesthesia Postprocedure Evaluation (Signed)
Anesthesia Post Note  Patient: Thomas Bradley  Procedure(s) Performed: LEFT TOTAL HIP ARTHROPLASTY ANTERIOR APPROACH (Left Hip)     Patient location during evaluation: PACU Anesthesia Type: MAC and Spinal Level of consciousness: awake and alert Pain management: pain level controlled Vital Signs Assessment: post-procedure vital signs reviewed and stable Respiratory status: spontaneous breathing, nonlabored ventilation, respiratory function stable and patient connected to nasal cannula oxygen Cardiovascular status: stable and blood pressure returned to baseline Postop Assessment: no apparent nausea or vomiting and spinal receding Anesthetic complications: no    Last Vitals:  Vitals:   10/10/19 1400 10/10/19 1600  BP: 109/66 (!) 103/59  Pulse: 72 70  Resp: 16 16  Temp:    SpO2: 99% 100%    Last Pain:  Vitals:   10/10/19 1600  TempSrc:   PainSc: 3                  Korayma Hagwood

## 2019-11-01 DIAGNOSIS — Z96642 Presence of left artificial hip joint: Secondary | ICD-10-CM | POA: Diagnosis not present

## 2019-11-10 DIAGNOSIS — Z96642 Presence of left artificial hip joint: Secondary | ICD-10-CM | POA: Diagnosis not present

## 2019-12-30 NOTE — Progress Notes (Signed)
Nurse connected with patient 01/02/20 at  9:30 AM EST by a telephone enabled telemedicine application and verified that I am speaking with the correct person using two identifiers. Patient stated full name and DOB. Patient gave permission to continue with virtual visit. Patient's location was at home and Nurse's location was at [place] office.   Subjective:   Thomas Bradley is a 74 y.o. male who presents for Medicare Annual/Subsequent preventive examination.  Goes out daily w/ wife. Does a little wood work. Attends church every Sunday.  Review of Systems:  Home Safety/Smoke Alarms: Feels safe in home. Smoke alarms in place.  Lives w/ wife in 1 story home. Walk-in shower.   Male:   CCS- 07/04/15. 5 yr recall per pt.      PSA-  Lab Results  Component Value Date   PSA 2.23 12/28/2018   PSA 3.94 05/08/2017   PSA 2.17 11/30/2015       Objective:    Vitals: Unable to assess. This visit is enabled though telemedicine due to Covid 19.   Advanced Directives 01/02/2020 10/07/2019 12/28/2018 11/09/2017  Does Patient Have a Medical Advance Directive? No No No No  Would patient like information on creating a medical advance directive? No - Patient declined No - Patient declined No - Patient declined No - Patient declined    Tobacco Social History   Tobacco Use  Smoking Status Former Smoker  . Types: Cigarettes  . Quit date: 11/13/1976  . Years since quitting: 43.1  Smokeless Tobacco Never Used     Counseling given: Not Answered   Clinical Intake: Pain : No/denies pain     Past Medical History:  Diagnosis Date  . Benign prostatic hypertrophy    ED, sees urology every year  . BPH (benign prostatic hypertrophy) with urinary obstruction    Dr. Risa Grill  . Elevated PSA    Dr. Risa Grill  . Erectile dysfunction due to arterial insufficiency    Dr. Risa Grill  . Erythema multiforme    dx 2013   . GERD (gastroesophageal reflux disease)   . Hyperlipemia   . Hypertension   . Nocturia    . Prostatitis    2010, again 09-2011   Past Surgical History:  Procedure Laterality Date  . HERNIA REPAIR  07-2010   open, R inguinal w/ mesh  . TOTAL HIP ARTHROPLASTY Left 10/10/2019   Procedure: LEFT TOTAL HIP ARTHROPLASTY ANTERIOR APPROACH;  Surgeon: Frederik Pear, MD;  Location: [facility];  Service: Orthopedics;  Laterality: Left;   Family History  Problem Relation Age of Onset  . Coronary artery disease Father 68  . Hypertension Sister   . Diabetes Brother   . Stroke Neg Hx   . Colon cancer Neg Hx   . Prostate cancer Neg Hx    Social History   Socioeconomic History  . Marital status: Married    Spouse name: Not on file  . Number of children: 2  . Years of education: Not on file  . Highest education level: Not on file  Occupational History  . Occupation: retired   Tobacco Use  . Smoking status: Former Smoker    Types: Cigarettes    Quit date: 11/13/1976    Years since quitting: 43.1  . Smokeless tobacco: Never Used  Substance and Sexual Activity  . Alcohol use: No  . Drug use: No  . Sexual activity: Never  Other Topics Concern  . Not on file  Social History Narrative   Has 3 acres, works  in the yard a lot x 6 hours sometimes, feels well.     4 G-kids        Social Determinants of Radio broadcast assistant Strain: Low Risk   . Difficulty of Paying Living Expenses: Not hard at all  Food Insecurity: No Food Insecurity  . Worried About Charity fundraiser in the Last Year: Never true  . Ran Out of Food in the Last Year: Never true  Transportation Needs: No Transportation Needs  . Lack of Transportation (Medical): No  . Lack of Transportation (Non-Medical): No  Physical Activity:   . Days of Exercise per Week: Not on file  . Minutes of Exercise per Session: Not on file  Stress:   . Feeling of Stress : Not on file  Social Connections:   . Frequency of Communication with Friends and Family: Not on file  . Frequency of Social Gatherings with Friends and  Family: Not on file  . Attends Religious Services: Not on file  . Active Member of Clubs or Organizations: Not on file  . Attends Archivist Meetings: Not on file  . Marital Status: Not on file    Outpatient Encounter Medications as of 01/02/2020  Medication Sig  . amLODipine (NORVASC) 10 MG tablet Take 1 tablet (10 mg total) by mouth daily.  . Cholecalciferol (VITAMIN D3) 2000 UNITS TABS Take 1 tablet by mouth daily. Reported on 02/01/2016  . Cyanocobalamin (B-12) 2500 MCG TABS Take 1 tablet by mouth daily. Reported on 02/01/2016  . doxazosin (CARDURA) 4 MG tablet Take 1 tablet (4 mg total) by mouth daily.  . Multiple Vitamin (MULTIVITAMIN) capsule Take 1 capsule by mouth daily. Reported on 02/01/2016  . pravastatin (PRAVACHOL) 40 MG tablet Take 1 tablet (40 mg total) by mouth at bedtime.  . vitamin E 400 UNIT capsule Take 400 mg by mouth daily.   Marland Kitchen aspirin EC 81 MG tablet Take 1 tablet (81 mg total) by mouth 2 (two) times daily. (Patient not taking: Reported on 01/02/2020)  . oxyCODONE-acetaminophen (PERCOCET/ROXICET) 5-325 MG tablet Take 1 tablet by mouth every 4 (four) hours as needed for severe pain. (Patient not taking: Reported on 01/02/2020)  . tiZANidine (ZANAFLEX) 2 MG tablet Take 1 tablet (2 mg total) by mouth every 6 (six) hours as needed. (Patient not taking: Reported on 01/02/2020)   No facility-administered encounter medications on file as of 01/02/2020.    Activities of Daily Living In your present state of health, do you have any difficulty performing the following activities: 01/02/2020 10/10/2019  Hearing? N N  Vision? N N  Difficulty concentrating or making decisions? N N  Walking or climbing stairs? N N  Dressing or bathing? N N  Doing errands, shopping? N -  Preparing Food and eating ? N -  Using the Toilet? N -  In the past six months, have you accidently leaked urine? N -  Do you have problems with loss of bowel control? N -  Managing your Medications? N -   Managing your Finances? N -  Housekeeping or managing your Housekeeping? N -  Some recent data might be hidden    Patient Care Team: Colon Branch, MD as PCP - General Rana Snare, MD as Consulting Physician (Urology) Frederik Pear, MD as Consulting Physician (Orthopedic Surgery)   Assessment:   This is a routine wellness examination for [place]. Physical assessment deferred to PCP.  Exercise Activities and Dietary recommendations Current Exercise Habits: The patient does not  participate in regular exercise at present, Exercise limited by: None identified Diet (meal preparation, eat out, water intake, caffeinated beverages, dairy products, fruits and vegetables): well balanced    Goals    . Increase physical activity    . Maintain current health (pt-stated)       Fall Risk Fall Risk  01/02/2020 06/30/2019 12/28/2018 11/09/2017 11/09/2017  Falls in the past year? 0 0 0 No No  Number falls in past yr: 0 - - - -  Injury with Fall? 0 - - - -  Follow up Education provided;Falls prevention discussed - - - -   Depression Screen PHQ 2/9 Scores 01/02/2020 12/28/2018 11/09/2017 11/09/2017  PHQ - 2 Score 0 0 0 0    Cognitive Function Ad8 score reviewed for issues:  Issues making decisions: no  Less interest in hobbies / activities:no  Repeats questions, stories (family complaining):no  Trouble using ordinary gadgets (microwave, computer, phone):no  Forgets the month or year: no  Mismanaging finances: no  Remembering appts:no Daily problems with thinking and/or memory:no Ad8 score is=0  MMSE - Mini Mental State Exam 11/09/2017  Orientation to time 5  Orientation to Place 5  Registration 3  Attention/ Calculation 5  Recall 2  Language- name 2 objects 2  Language- repeat 1  Language- follow 3 step command 3  Language- read & follow direction 1  Write a sentence 1  Copy design 1  Total score 29        Immunization History  Administered Date(s) Administered  . Fluad Quad(high  Dose 65+) 07/11/2019  . Influenza, High Dose Seasonal PF 07/27/2013, 08/16/2018  . Influenza,inj,Quad PF,6+ Mos 08/02/2015  . Influenza-Unspecified 08/14/2016, 07/27/2017  . Pneumococcal Conjugate-13 04/27/2014  . Pneumococcal Polysaccharide-23 01/23/2009  . Tdap 01/28/2011, 07/25/2015  . Zoster 07/22/2010    Screening Tests Health Maintenance  Topic Date Due  . COLONOSCOPY  07/03/2020  . TETANUS/TDAP  07/24/2025  . INFLUENZA VACCINE  Completed  . Hepatitis C Screening  Completed  . PNA vac Low Risk Adult  Completed     Plan:   See you next year!  Continue to eat heart healthy diet (full of fruits, vegetables, whole grains, lean protein, water--limit salt, fat, and sugar intake) and increase physical activity as tolerated.  Continue doing brain stimulating activities (puzzles, reading, adult coloring books, staying active) to keep memory sharp.    I have personally reviewed and noted the following in the patient's chart:   . Medical and social history . Use of alcohol, tobacco or illicit drugs  . Current medications and supplements . Functional ability and status . Nutritional status . Physical activity . Advanced directives . List of other physicians . Hospitalizations, surgeries, and ER visits in previous 12 months . Vitals . Screenings to include cognitive, depression, and falls . Referrals and appointments  In addition, I have reviewed and discussed with patient certain preventive protocols, quality metrics, and best practice recommendations. A written personalized care plan for preventive services as well as general preventive health recommendations were provided to patient.     Shela Nevin, [place]  01/02/2020

## 2020-01-02 ENCOUNTER — Other Ambulatory Visit: Payer: Self-pay

## 2020-01-02 ENCOUNTER — Ambulatory Visit (INDEPENDENT_AMBULATORY_CARE_PROVIDER_SITE_OTHER): Payer: Medicare Other | Admitting: *Deleted

## 2020-01-02 DIAGNOSIS — Z0000 Encounter for general adult medical examination without abnormal findings: Secondary | ICD-10-CM | POA: Diagnosis not present

## 2020-01-02 NOTE — Patient Instructions (Signed)
See you next year!  Continue to eat heart healthy diet (full of fruits, vegetables, whole grains, lean protein, water--limit salt, fat, and sugar intake) and increase physical activity as tolerated.  Continue doing brain stimulating activities (puzzles, reading, adult coloring books, staying active) to keep memory sharp.    Thomas Bradley , Thank you for taking time to come for your Medicare Wellness Visit. I appreciate your ongoing commitment to your health goals. Please review the following plan we discussed and let me know if I can assist you in the future.   These are the goals we discussed: Goals    . Increase physical activity    . Maintain current health (pt-stated)       This is a list of the screening recommended for you and due dates:  Health Maintenance  Topic Date Due  . Colon Cancer Screening  07/03/2020  . Tetanus Vaccine  07/24/2025  . Flu Shot  Completed  .  Hepatitis C: One time screening is recommended by Center for Disease Control  (CDC) for  adults born from [birth year] through 1967.   Completed  . Pneumonia vaccines  Completed    Preventive Care 27 Years and Older, Male Preventive care refers to lifestyle choices and visits with your health care provider that can promote health and wellness. This includes:  A yearly physical exam. This is also called an annual well check.  Regular dental and eye exams.  Immunizations.  Screening for certain conditions.  Healthy lifestyle choices, such as diet and exercise. What can I expect for my preventive care visit? Physical exam Your health care provider will check:  Height and weight. These may be used to calculate body mass index (BMI), which is a measurement that tells if you are at a healthy weight.  Heart rate and blood pressure.  Your skin for abnormal spots. Counseling Your health care provider may ask you questions about:  Alcohol, tobacco, and drug use.  Emotional well-being.  Home and relationship  well-being.  Sexual activity.  Eating habits.  History of falls.  Memory and ability to understand (cognition).  Work and work Statistician. What immunizations do I need?  Influenza (flu) vaccine  This is recommended every year. Tetanus, diphtheria, and pertussis (Tdap) vaccine  You may need a Td booster every 10 years. Varicella (chickenpox) vaccine  You may need this vaccine if you have not already been vaccinated. Zoster (shingles) vaccine  You may need this after age 83. Pneumococcal conjugate (PCV13) vaccine  One dose is recommended after age 36. Pneumococcal polysaccharide (PPSV23) vaccine  One dose is recommended after age 67. Measles, mumps, and rubella (MMR) vaccine  You may need at least one dose of MMR if you were born in 1957 or later. You may also need a second dose. Meningococcal conjugate (MenACWY) vaccine  You may need this if you have certain conditions. Hepatitis A vaccine  You may need this if you have certain conditions or if you travel or work in places where you may be exposed to hepatitis A. Hepatitis B vaccine  You may need this if you have certain conditions or if you travel or work in places where you may be exposed to hepatitis B. Haemophilus influenzae type b (Hib) vaccine  You may need this if you have certain conditions. You may receive vaccines as individual doses or as more than one vaccine together in one shot (combination vaccines). Talk with your health care provider about the risks and benefits of combination vaccines.  What tests do I need? Blood tests  Lipid and cholesterol levels. These may be checked every 5 years, or more frequently depending on your overall health.  Hepatitis C test.  Hepatitis B test. Screening  Lung cancer screening. You may have this screening every year starting at age 3 if you have a 30-pack-year history of smoking and currently smoke or have quit within the past 15 years.  Colorectal cancer  screening. All adults should have this screening starting at age 17 and continuing until age 73. Your health care provider may recommend screening at age 81 if you are at increased risk. You will have tests every 1-10 years, depending on your results and the type of screening test.  Prostate cancer screening. Recommendations will vary depending on your family history and other risks.  Diabetes screening. This is done by checking your blood sugar (glucose) after you have not eaten for a while (fasting). You may have this done every 1-3 years.  Abdominal aortic aneurysm (AAA) screening. You may need this if you are a current or former smoker.  Sexually transmitted disease (STD) testing. Follow these instructions at home: Eating and drinking  Eat a diet that includes fresh fruits and vegetables, whole grains, lean protein, and low-fat dairy products. Limit your intake of foods with high amounts of sugar, saturated fats, and salt.  Take vitamin and mineral supplements as recommended by your health care provider.  Do not drink alcohol if your health care provider tells you not to drink.  If you drink alcohol: ? Limit how much you have to 0-2 drinks a day. ? Be aware of how much alcohol is in your drink. In the [place] one drink equals one 12 oz bottle of beer (355 mL), one 5 oz glass of wine (148 mL), or one 1 oz glass of hard liquor (44 mL). Lifestyle  Take daily care of your teeth and gums.  Stay active. Exercise for at least 30 minutes on 5 or more days each week.  Do not use any products that contain nicotine or tobacco, such as cigarettes, e-cigarettes, and chewing tobacco. If you need help quitting, ask your health care provider.  If you are sexually active, practice safe sex. Use a condom or other form of protection to prevent STIs (sexually transmitted infections).  Talk with your health care provider about taking a low-dose aspirin or statin. What's next?  Visit your health care  provider once a year for a well check visit.  Ask your health care provider how often you should have your eyes and teeth checked.  Stay up to date on all vaccines. This information is not intended to replace advice given to you by your health care provider. Make sure you discuss any questions you have with your health care provider. Document Revised: 10/07/2018 Document Reviewed: 10/07/2018 Elsevier Patient Education  2020 [employer].

## 2020-01-17 ENCOUNTER — Ambulatory Visit: Payer: Medicare Other | Admitting: Internal Medicine

## 2020-01-26 ENCOUNTER — Other Ambulatory Visit: Payer: Self-pay

## 2020-01-26 ENCOUNTER — Ambulatory Visit (INDEPENDENT_AMBULATORY_CARE_PROVIDER_SITE_OTHER): Payer: Medicare Other | Admitting: Internal Medicine

## 2020-01-26 ENCOUNTER — Encounter: Payer: Self-pay | Admitting: Internal Medicine

## 2020-01-26 VITALS — BP 129/77 | HR 80 | Temp 97.5°F | Resp 16 | Ht 74.0 in | Wt 215.1 lb

## 2020-01-26 DIAGNOSIS — M8949 Other hypertrophic osteoarthropathy, multiple sites: Secondary | ICD-10-CM

## 2020-01-26 DIAGNOSIS — E119 Type 2 diabetes mellitus without complications: Secondary | ICD-10-CM

## 2020-01-26 DIAGNOSIS — I10 Essential (primary) hypertension: Secondary | ICD-10-CM | POA: Diagnosis not present

## 2020-01-26 DIAGNOSIS — M159 Polyosteoarthritis, unspecified: Secondary | ICD-10-CM

## 2020-01-26 LAB — HEMOGLOBIN A1C: Hgb A1c MFr Bld: 6.4 % (ref 4.6–6.5)

## 2020-01-26 LAB — BASIC METABOLIC PANEL
BUN: 18 mg/dL (ref 6–23)
CO2: 27 mEq/L (ref 19–32)
Calcium: 9.3 mg/dL (ref 8.4–10.5)
Chloride: 105 mEq/L (ref 96–112)
Creatinine, Ser: 0.88 mg/dL (ref 0.40–1.50)
GFR: 84.18 mL/min (ref >60.00)
Glucose, Bld: 107 mg/dL — ABNORMAL HIGH (ref 70–99)
Potassium: 4.2 mEq/L (ref 3.5–5.1)
Sodium: 138 mEq/L (ref 135–145)

## 2020-01-26 NOTE — Progress Notes (Signed)
Pre visit review using our clinic review tool, if applicable. No additional management support is needed unless otherwise documented below in the visit note. 

## 2020-01-26 NOTE — Patient Instructions (Addendum)
Continue checking your blood pressures BP GOAL is between 110/65 and  135/85. If it is consistently higher or lower, let me know   GO TO THE LAB : Get the blood work     [place], please reschedule your appointments Come back for   a checkup in 6 months

## 2020-01-26 NOTE — Progress Notes (Signed)
Subjective:    Patient ID: Thomas Bradley, male    DOB: 26-Apr-1946, 74 y.o.   MRN: [id number]  DOS:  01/26/2020 Type of visit - description: Follow-up Since the last office visit he got Covid diagnosed 10-2019, he felt poorly for 3 weeks, did not take any infusions, he feels 100% recuperated. We also talked about high blood pressure, DJD, high cholesterol.    Review of Systems Denies chest pain or difficulty breathing No cough  Past Medical History:  Diagnosis Date  . Benign prostatic hypertrophy    ED, sees urology every year  . BPH (benign prostatic hypertrophy) with urinary obstruction    Dr. Risa Grill  . Elevated PSA    Dr. Risa Grill  . Erectile dysfunction due to arterial insufficiency    Dr. Risa Grill  . Erythema multiforme    dx 2013   . GERD (gastroesophageal reflux disease)   . Hyperlipemia   . Hypertension   . Nocturia   . Prostatitis    2010, again 09-2011    Past Surgical History:  Procedure Laterality Date  . HERNIA REPAIR  07-2010   open, R inguinal w/ mesh  . TOTAL HIP ARTHROPLASTY Left 10/10/2019   Procedure: LEFT TOTAL HIP ARTHROPLASTY ANTERIOR APPROACH;  Surgeon: Frederik Pear, MD;  Location: [facility];  Service: Orthopedics;  Laterality: Left;    Allergies as of 01/26/2020   No Known Allergies     Medication List       Accurate as of January 26, 2020 11:59 PM. If you have any questions, ask your nurse or doctor.        STOP taking these medications   aspirin EC 81 MG tablet Stopped by: Kathlene November, MD   oxyCODONE-acetaminophen 5-325 MG tablet Commonly known as: PERCOCET/ROXICET Stopped by: Kathlene November, MD   tiZANidine 2 MG tablet Commonly known as: ZANAFLEX Stopped by: Kathlene November, MD     TAKE these medications   amLODipine 10 MG tablet Commonly known as: NORVASC Take 1 tablet (10 mg total) by mouth daily.   B-12 2500 MCG Tabs Take 1 tablet by mouth daily. Reported on 02/01/2016   doxazosin 4 MG tablet Commonly known as: CARDURA Take 1 tablet (4 mg  total) by mouth daily.   multivitamin capsule Take 1 capsule by mouth daily. Reported on 02/01/2016   pravastatin 40 MG tablet Commonly known as: PRAVACHOL Take 1 tablet (40 mg total) by mouth at bedtime.   Vitamin D3 50 MCG (2000 UT) Tabs Take 1 tablet by mouth daily. Reported on 02/01/2016   vitamin E 180 MG (400 UNITS) capsule Take 400 mg by mouth daily.          Objective:   Physical Exam BP 129/77 (BP Location: Left Arm, Patient Position: Sitting, Cuff Size: Normal)   Pulse 80   Temp (!) 97.5 F (36.4 C) (Temporal)   Resp 16   Ht 6\' 2"  (1.88 m)   Wt 215 lb 2 oz (97.6 kg)   SpO2 99%   BMI 27.62 kg/m  General:   Well developed, NAD, BMI noted. HEENT:  Normocephalic . Face symmetric, atraumatic Lungs:  CTA B Normal respiratory effort, no intercostal retractions, no accessory muscle use. Heart: RRR,  no murmur.  Lower extremities: no pretibial edema bilaterally  Skin: Not pale. Not jaundice Neurologic:  alert & oriented X3.  Speech normal, gait appropriate for age, still has some difficulty transferring due to recent hip replacement Psych--  Cognition and judgment appear intact.  Cooperative with  normal attention span and concentration.  Behavior appropriate. No anxious or depressed appearing.      Assessment     Assessment DM HTN (intol to losartan, 2018) Hyperlipidemia GERD Erythema multiforme 2013 GU: --BPH w/ elevated PSA, asymmetric prostate --Prostatitis 2010, 2012 --ED Has a epi-pen -- reason? Pt not sure, had an allergy to a medicine, name?  PLAN: DM: Diet controlled, check a BMP and A1c. HTN: On amlodipine, Cardura, 3-week average BP: 116/74.  No change COVID-19: Dx 10-2019, feels fully recuperated, did not get any infusions, encouraged to proceed with the shots. DJD: Had a hip replacement few months ago, feels great, no pain, still have some difficulty transferring. RTC 6 months   This visit occurred during the SARS-CoV-2 public health  emergency.  Safety protocols were in place, including screening questions prior to the visit, additional usage of staff PPE, and extensive cleaning of exam room while observing appropriate contact time as indicated for disinfecting solutions.

## 2020-01-29 NOTE — Assessment & Plan Note (Signed)
DM: Diet controlled, check a BMP and A1c. HTN: On amlodipine, Cardura, 3-week average BP: 116/74.  No change COVID-19: Dx 10-2019, feels fully recuperated, did not get any infusions, encouraged to proceed with the shots. DJD: Had a hip replacement few months ago, feels great, no pain, still have some difficulty transferring. RTC 6 months

## 2020-02-09 ENCOUNTER — Ambulatory Visit: Payer: Medicare Other | Attending: Internal Medicine

## 2020-02-09 DIAGNOSIS — Z23 Encounter for immunization: Secondary | ICD-10-CM

## 2020-02-09 NOTE — Progress Notes (Signed)
   Covid-19 Vaccination Clinic  Name:  Thomas Bradley    MRN: [id number] DOB: Apr 06, 1946  02/09/2020  [name] was observed post Covid-19 immunization for 15 minutes without incident. He was provided with Vaccine Information Sheet and instruction to access the V-Safe system.   [name] was instructed to call 911 with any severe reactions post vaccine: Marland Kitchen Difficulty breathing  . Swelling of face and throat  . A fast heartbeat  . A bad rash all over body  . Dizziness and weakness   Immunizations Administered    Name Date Dose VIS Date Route   Pfizer COVID-19 Vaccine 02/09/2020  9:31 AM 0.3 mL 10/07/2019 Intramuscular   Manufacturer: [place]   Lot: [id number]   [place]: [id number]

## 2020-02-24 ENCOUNTER — Other Ambulatory Visit: Payer: Self-pay | Admitting: Internal Medicine

## 2020-03-05 ENCOUNTER — Ambulatory Visit: Payer: Medicare Other | Attending: Internal Medicine

## 2020-03-05 DIAGNOSIS — Z23 Encounter for immunization: Secondary | ICD-10-CM

## 2020-03-05 NOTE — Progress Notes (Signed)
   Covid-19 Vaccination Clinic  Name:  Thomas Bradley    MRN: [id number] DOB: 1946-09-23  03/05/2020  [name] was observed post Covid-19 immunization for 15 minutes without incident. He was provided with Vaccine Information Sheet and instruction to access the V-Safe system.   [name] was instructed to call 911 with any severe reactions post vaccine: Marland Kitchen Difficulty breathing  . Swelling of face and throat  . A fast heartbeat  . A bad rash all over body  . Dizziness and weakness   Immunizations Administered    Name Date Dose VIS Date Route   Pfizer COVID-19 Vaccine 03/05/2020  9:36 AM 0.3 mL 12/21/2018 Intramuscular   Manufacturer: [place]   Lot: [id number]   [place]: [id number]

## 2020-03-17 ENCOUNTER — Other Ambulatory Visit: Payer: Self-pay | Admitting: Internal Medicine

## 2020-05-08 ENCOUNTER — Encounter: Payer: Self-pay | Admitting: Internal Medicine

## 2020-05-17 DIAGNOSIS — R319 Hematuria, unspecified: Secondary | ICD-10-CM | POA: Diagnosis not present

## 2020-05-17 DIAGNOSIS — N390 Urinary tract infection, site not specified: Secondary | ICD-10-CM | POA: Diagnosis not present

## 2020-05-17 DIAGNOSIS — R3912 Poor urinary stream: Secondary | ICD-10-CM | POA: Diagnosis not present

## 2020-07-27 ENCOUNTER — Ambulatory Visit: Payer: Medicare Other | Admitting: Internal Medicine

## 2020-08-27 ENCOUNTER — Ambulatory Visit (INDEPENDENT_AMBULATORY_CARE_PROVIDER_SITE_OTHER): Payer: Medicare Other | Admitting: Internal Medicine

## 2020-08-27 ENCOUNTER — Other Ambulatory Visit: Payer: Self-pay

## 2020-08-27 ENCOUNTER — Encounter: Payer: Self-pay | Admitting: Internal Medicine

## 2020-08-27 VITALS — BP 125/76 | HR 71 | Temp 97.8°F | Resp 16 | Ht 74.0 in | Wt 221.4 lb

## 2020-08-27 DIAGNOSIS — R3989 Other symptoms and signs involving the genitourinary system: Secondary | ICD-10-CM

## 2020-08-27 DIAGNOSIS — Z1211 Encounter for screening for malignant neoplasm of colon: Secondary | ICD-10-CM

## 2020-08-27 DIAGNOSIS — E119 Type 2 diabetes mellitus without complications: Secondary | ICD-10-CM | POA: Diagnosis not present

## 2020-08-27 DIAGNOSIS — K635 Polyp of colon: Secondary | ICD-10-CM | POA: Diagnosis not present

## 2020-08-27 DIAGNOSIS — N400 Enlarged prostate without lower urinary tract symptoms: Secondary | ICD-10-CM

## 2020-08-27 DIAGNOSIS — E785 Hyperlipidemia, unspecified: Secondary | ICD-10-CM

## 2020-08-27 NOTE — Patient Instructions (Addendum)
Per our records you are due for an eye exam. Please contact your eye doctor to schedule an appointment. Please have them send copies of your office visit notes to [place]. Our fax number is [phone].   Check the  blood pressure  BP GOAL is between 110/65 and  135/85. If it is consistently higher or lower, let me know    GO TO THE LAB : Get the blood work     [place]era [place], [place] back for   a checkup in 6 months

## 2020-08-27 NOTE — Progress Notes (Signed)
Subjective:    Patient ID: Thomas Bradley, male    DOB: 22-Apr-1946, 74 y.o.   MRN: [id number]  DOS:  08/27/2020 Type of visit - description: Follow-up Since the last office visit he is feeling well and has no major concerns. Good med compliance, previous labs reviewed. Today we talk about immunizations. History of abnormal prostate exam, denies any LUTS. Had a hip replacement, able to be active, having difficulties with the hip only when he "overdo"   Review of Systems See above   Past Medical History:  Diagnosis Date  . Benign prostatic hypertrophy    ED, sees urology every year  . BPH (benign prostatic hypertrophy) with urinary obstruction    Dr. Risa Grill  . Elevated PSA    Dr. Risa Grill  . Erectile dysfunction due to arterial insufficiency    Dr. Risa Grill  . Erythema multiforme    dx 2013   . GERD (gastroesophageal reflux disease)   . Hyperlipemia   . Hypertension   . Nocturia   . Prostatitis    2010, again 09-2011    Past Surgical History:  Procedure Laterality Date  . HERNIA REPAIR  07-2010   open, R inguinal w/ mesh  . TOTAL HIP ARTHROPLASTY Left 10/10/2019   Procedure: LEFT TOTAL HIP ARTHROPLASTY ANTERIOR APPROACH;  Surgeon: Frederik Pear, MD;  Location: [facility];  Service: Orthopedics;  Laterality: Left;    Allergies as of 08/27/2020   No Known Allergies     Medication List       Accurate as of August 27, 2020 11:59 PM. If you have any questions, ask your nurse or doctor.        amLODipine 10 MG tablet Commonly known as: NORVASC Take 1 tablet (10 mg total) by mouth daily.   B-12 2500 MCG Tabs Take 1 tablet by mouth daily. Reported on 02/01/2016   doxazosin 4 MG tablet Commonly known as: CARDURA Take 1 tablet (4 mg total) by mouth daily.   Krill Oil 500 MG Caps Take 500 mg by mouth daily.   multivitamin capsule Take 1 capsule by mouth daily. Reported on 02/01/2016   pravastatin 40 MG tablet Commonly known as: PRAVACHOL Take 1 tablet (40 mg total) by  mouth at bedtime.   Vitamin D3 50 MCG (2000 UT) Tabs Take 1 tablet by mouth daily. Reported on 02/01/2016   vitamin E 180 MG (400 UNITS) capsule Take 400 mg by mouth daily.          Objective:   Physical Exam BP 125/76 (BP Location: Left Arm, Patient Position: Sitting, Cuff Size: Normal)   Pulse 71   Temp 97.8 F (36.6 C) (Oral)   Resp 16   Ht 6\' 2"  (1.88 m)   Wt 221 lb 6 oz (100.4 kg)   SpO2 98%   BMI 28.42 kg/m  General:   Well developed, NAD, BMI noted.  HEENT:  Normocephalic . Face symmetric, atraumatic Neck: No thyromegaly Lungs:  CTA B Normal respiratory effort, no intercostal retractions, no accessory muscle use. Heart: RRR,  no murmur.  Abdomen:  Not distended, soft, non-tender. No rebound or rigidity. DRE: Normal sphincter, no stools, the entire right prostate lobe is larger and firmer than the left, not tender, not nodular Skin: Not pale. Not jaundice Lower extremities: no pretibial edema bilaterally  Neurologic:  alert & oriented X3.  Speech normal, gait appropriate for age and unassisted Psych--  Cognition and judgment appear intact.  Cooperative with normal attention span and concentration.  Behavior  appropriate. No anxious or depressed appearing.     Assessment     Assessment DM HTN (intol to losartan, 2018) Hyperlipidemia GERD Erythema multiforme 2013 GU: --BPH w/ elevated PSA, asymmetric prostate --Prostatitis 2010, 2012 --ED Has a epi-pen -- reason? Pt not sure, had an allergy to a medicine, name?  PLAN: DM: Diet controlled, doing well with lifestyle, check A1c HTN: Amlodipine, Cardura.  Check no recent ambulatory BPs are typically normal Hyperlipidemia: On Pravachol, checking AST, ALT, FLP. BPH, abnormal digital rectal exam: The entire right side of the prostate is enlarged & firm, not a new finding, this is going on for years.  Last visit with urology 2017.  Issue seems unchanged since then.  He is on Cardura and has no symptoms.   Check a PSA. Preventive care reviewed. RTC 6 months , ROV    This visit occurred during the SARS-CoV-2 public health emergency.  Safety protocols were in place, including screening questions prior to the visit, additional usage of staff PPE, and extensive cleaning of exam room while observing appropriate contact time as indicated for disinfecting solutions.

## 2020-08-27 NOTE — Progress Notes (Signed)
Pre visit review using our clinic review tool, if applicable. No additional management support is needed unless otherwise documented below in the visit note. 

## 2020-08-28 DIAGNOSIS — Z23 Encounter for immunization: Secondary | ICD-10-CM | POA: Diagnosis not present

## 2020-08-28 LAB — LIPID PANEL
Cholesterol: 168 mg/dL (ref <200)
HDL: 51 mg/dL (ref >=40)
LDL Cholesterol (Calc): 99 mg/dL (calc)
Non-HDL Cholesterol (Calc): 117 mg/dL (calc) (ref <130)
Total CHOL/HDL Ratio: 3.3 (calc) (ref <5.0)
Triglycerides: 86 mg/dL (ref <150)

## 2020-08-28 LAB — CBC WITH DIFFERENTIAL/PLATELET
Absolute Monocytes: 585 cells/uL (ref 200–950)
Basophils Absolute: 30 cells/uL (ref 0–200)
Basophils Relative: 0.7 %
Eosinophils Absolute: 120 cells/uL (ref 15–500)
Eosinophils Relative: 2.8 %
HCT: 45.7 % (ref 38.5–50.0)
Hemoglobin: 14.9 g/dL (ref 13.2–17.1)
Lymphs Abs: 1152 cells/uL (ref 850–3900)
MCH: 30 pg (ref 27.0–33.0)
MCHC: 32.6 g/dL (ref 32.0–36.0)
MCV: 92.1 fL (ref 80.0–100.0)
MPV: 10.3 fL (ref 7.5–12.5)
Monocytes Relative: 13.6 %
Neutro Abs: 2412 cells/uL (ref 1500–7800)
Neutrophils Relative %: 56.1 %
Platelets: 155 10*3/uL (ref 140–400)
RBC: 4.96 10*6/uL (ref 4.20–5.80)
RDW: 13.9 % (ref 11.0–15.0)
Total Lymphocyte: 26.8 %
WBC: 4.3 10*3/uL (ref 3.8–10.8)

## 2020-08-28 LAB — HEMOGLOBIN A1C
Hgb A1c MFr Bld: 6.4 % of total Hgb — ABNORMAL HIGH (ref <5.7)
Mean Plasma Glucose: 137 (calc)
eAG (mmol/L): 7.6 (calc)

## 2020-08-28 LAB — PSA: PSA: 3.01 ng/mL (ref <=4.0)

## 2020-08-28 LAB — ALT: ALT: 13 U/L (ref 9–46)

## 2020-08-28 LAB — AST: AST: 13 U/L (ref 10–35)

## 2020-08-28 NOTE — Assessment & Plan Note (Signed)
Preventive care reviewed -Td  01-2011 -  pneumonia shot 2010;  prevnar-- 2015 - shingles shot 07-22-10; shingrix discussed before - s/p covid vax x 2, plans to get a booster  - flu shot: Will get at the pharmacy -FBP:ZWCHENIDPOE 2004,Dr Santogade, colonoscopy again 05-2010 -Dr Michail Sermon-, hyperplastic polyps, cscope 06-2015: no polyps, 5 years per report.  Refer to GI -Prostate: See comments under BPH

## 2020-08-28 NOTE — Assessment & Plan Note (Signed)
DM: Diet controlled, doing well with lifestyle, check A1c HTN: Amlodipine, Cardura.  Check no recent ambulatory BPs are typically normal Hyperlipidemia: On Pravachol, checking AST, ALT, FLP. BPH, abnormal digital rectal exam: The entire right side of the prostate is enlarged & firm, not a new finding, this is going on for years.  Last visit with urology 2017.  Issue seems unchanged since then.  He is on Cardura and has no symptoms.  Check a PSA. Preventive care reviewed. RTC 6 months , ROV

## 2020-09-05 DIAGNOSIS — Z23 Encounter for immunization: Secondary | ICD-10-CM | POA: Diagnosis not present

## 2020-09-06 ENCOUNTER — Other Ambulatory Visit: Payer: Self-pay | Admitting: Internal Medicine

## 2020-11-02 DIAGNOSIS — Z8371 Family history of colonic polyps: Secondary | ICD-10-CM | POA: Diagnosis not present

## 2020-12-03 DIAGNOSIS — H35033 Hypertensive retinopathy, bilateral: Secondary | ICD-10-CM | POA: Diagnosis not present

## 2020-12-03 LAB — HM DIABETES EYE EXAM

## 2020-12-06 DIAGNOSIS — Z01812 Encounter for preprocedural laboratory examination: Secondary | ICD-10-CM | POA: Diagnosis not present

## 2020-12-11 ENCOUNTER — Other Ambulatory Visit: Payer: Self-pay | Admitting: Internal Medicine

## 2020-12-11 DIAGNOSIS — Z8371 Family history of colonic polyps: Secondary | ICD-10-CM | POA: Diagnosis not present

## 2020-12-11 DIAGNOSIS — K640 First degree hemorrhoids: Secondary | ICD-10-CM | POA: Diagnosis not present

## 2020-12-11 DIAGNOSIS — D120 Benign neoplasm of cecum: Secondary | ICD-10-CM | POA: Diagnosis not present

## 2020-12-11 LAB — HM COLONOSCOPY

## 2020-12-14 DIAGNOSIS — D120 Benign neoplasm of cecum: Secondary | ICD-10-CM | POA: Diagnosis not present

## 2021-02-26 ENCOUNTER — Ambulatory Visit (INDEPENDENT_AMBULATORY_CARE_PROVIDER_SITE_OTHER): Payer: Medicare Other | Admitting: Internal Medicine

## 2021-02-26 ENCOUNTER — Other Ambulatory Visit: Payer: Self-pay

## 2021-02-26 VITALS — BP 137/87 | HR 74 | Temp 97.5°F | Ht 74.0 in | Wt 221.4 lb

## 2021-02-26 DIAGNOSIS — Z23 Encounter for immunization: Secondary | ICD-10-CM | POA: Diagnosis not present

## 2021-02-26 DIAGNOSIS — I10 Essential (primary) hypertension: Secondary | ICD-10-CM

## 2021-02-26 DIAGNOSIS — N400 Enlarged prostate without lower urinary tract symptoms: Secondary | ICD-10-CM | POA: Diagnosis not present

## 2021-02-26 DIAGNOSIS — E119 Type 2 diabetes mellitus without complications: Secondary | ICD-10-CM | POA: Diagnosis not present

## 2021-02-26 LAB — BASIC METABOLIC PANEL
BUN: 22 mg/dL (ref 6–23)
CO2: 28 mEq/L (ref 19–32)
Calcium: 9.8 mg/dL (ref 8.4–10.5)
Chloride: 106 mEq/L (ref 96–112)
Creatinine, Ser: 1.05 mg/dL (ref 0.40–1.50)
GFR: 68.62 mL/min (ref >60.00)
Glucose, Bld: 122 mg/dL — ABNORMAL HIGH (ref 70–99)
Potassium: 4.7 mEq/L (ref 3.5–5.1)
Sodium: 142 mEq/L (ref 135–145)

## 2021-02-26 LAB — HEMOGLOBIN A1C: Hgb A1c MFr Bld: 6.7 % — ABNORMAL HIGH (ref 4.6–6.5)

## 2021-02-26 NOTE — Assessment & Plan Note (Signed)
DM: Diet controlled, feet exam normal, encouraged to stay active.  Check A1c HTN: Continue amlodipine and Cardura, intolerant to losartan, check BMP BPH, abnormal digital exam: Last PSA satisfactory. Preventive care: PNM 23 today.  Pt plans to proceed with covid vax  #4 in the next few months.  I agree. RTC 6 months

## 2021-02-26 NOTE — Patient Instructions (Addendum)
Check the  blood pressure regularly. BP GOAL is between 110/65 and  135/85. If it is consistently higher or lower, let me know   GO TO THE LAB : Get the blood work     [place], [place] back for a checkup in 6 months, fasting

## 2021-02-26 NOTE — Progress Notes (Signed)
Subjective:    Patient ID: Thomas Bradley, male    DOB: 09/28/1946, 75 y.o.   MRN: [id number]  DOS:  02/26/2021 Type of visit - description: Routine follow-up, to discuss chronic medical problems  Since the last office visit he is doing well. Good med compliance Ambulatory BPs very good Denies any lower extremity paresthesias. From time to time his L hip ( that was replaced a couple of years ago) gets slightly stiff    Review of Systems See above   Past Medical History:  Diagnosis Date  . Benign prostatic hypertrophy    ED, sees urology every year  . BPH (benign prostatic hypertrophy) with urinary obstruction    Dr. Risa Grill  . Elevated PSA    Dr. Risa Grill  . Erectile dysfunction due to arterial insufficiency    Dr. Risa Grill  . Erythema multiforme    dx 2013   . GERD (gastroesophageal reflux disease)   . Hyperlipemia   . Hypertension   . Nocturia   . Prostatitis    2010, again 09-2011    Past Surgical History:  Procedure Laterality Date  . HERNIA REPAIR  07-2010   open, R inguinal w/ mesh  . TOTAL HIP ARTHROPLASTY Left 10/10/2019   Procedure: LEFT TOTAL HIP ARTHROPLASTY ANTERIOR APPROACH;  Surgeon: Frederik Pear, MD;  Location: [facility];  Service: Orthopedics;  Laterality: Left;    Allergies as of 02/26/2021   No Known Allergies     Medication List       Accurate as of Feb 26, 2021  6:07 PM. If you have any questions, ask your nurse or doctor.        amLODipine 10 MG tablet Commonly known as: NORVASC Take 1 tablet (10 mg total) by mouth daily.   B-12 2500 MCG Tabs Take 1 tablet by mouth daily. Reported on 02/01/2016   doxazosin 4 MG tablet Commonly known as: CARDURA Take 1 tablet (4 mg total) by mouth daily.   Krill Oil 500 MG Caps Take 500 mg by mouth daily.   multivitamin capsule Take 1 capsule by mouth daily. Reported on 02/01/2016   pravastatin 40 MG tablet Commonly known as: PRAVACHOL Take 1 tablet (40 mg total) by mouth at bedtime.   Vitamin D3 50  MCG (2000 UT) Tabs Take 1 tablet by mouth daily. Reported on 02/01/2016   vitamin E 180 MG (400 UNITS) capsule Take 400 mg by mouth daily.          Objective:   Physical Exam BP 137/87 (BP Location: Left Arm, Patient Position: Sitting, Cuff Size: Large)   Pulse 74   Temp (!) 97.5 F (36.4 C) (Temporal)   Ht 6\' 2"  (1.88 m)   Wt 221 lb 6.4 oz (100.4 kg)   SpO2 98%   BMI 28.43 kg/m  General:   Well developed, NAD, BMI noted. HEENT:  Normocephalic . Face symmetric, atraumatic Lungs:  CTA B Normal respiratory effort, no intercostal retractions, no accessory muscle use. Heart: RRR,  no murmur.  DM foot exam: No edema, good pulses, pinprick examination normal Skin: Not pale. Not jaundice Neurologic:  alert & oriented X3.  Speech normal, gait appropriate for age and unassisted Psych--  Cognition and judgment appear intact.  Cooperative with normal attention span and concentration.  Behavior appropriate. No anxious or depressed appearing.      Assessment    Assessment DM HTN (intol to losartan, 2018) Hyperlipidemia GERD Erythema multiforme 2013 GU: --BPH w/ elevated PSA, asymmetric prostate --Prostatitis 2010,  2012 --ED Has a epi-pen -- reason? Pt not sure, had an allergy to a medicine, name?  PLAN: DM: Diet controlled, feet exam normal, encouraged to stay active.  Check A1c HTN: Continue amlodipine and Cardura, intolerant to losartan, check BMP BPH, abnormal digital exam: Last PSA satisfactory. Preventive care: PNM 23 today.  Pt plans to proceed with covid vax  #4 in the next few months.  I agree. RTC 6 months   This visit occurred during the SARS-CoV-2 public health emergency.  Safety protocols were in place, including screening questions prior to the visit, additional usage of staff PPE, and extensive cleaning of exam room while observing appropriate contact time as indicated for disinfecting solutions.

## 2021-03-12 ENCOUNTER — Other Ambulatory Visit: Payer: Self-pay | Admitting: Internal Medicine

## 2021-04-15 ENCOUNTER — Encounter: Payer: Self-pay | Admitting: Internal Medicine

## 2021-05-17 ENCOUNTER — Telehealth: Payer: Self-pay | Admitting: Internal Medicine

## 2021-05-17 NOTE — Telephone Encounter (Signed)
Attempted to schedule AWV. Unable to LVM.  Will try at later time.  Mail box full 

## 2021-06-05 ENCOUNTER — Other Ambulatory Visit: Payer: Self-pay

## 2021-06-05 MED ORDER — DOXAZOSIN MESYLATE 4 MG PO TABS: 4.0000 mg | ORAL_TABLET | Freq: Every day | ORAL | 1 refills | Status: DC

## 2021-06-09 ENCOUNTER — Other Ambulatory Visit: Payer: Self-pay | Admitting: Internal Medicine

## 2021-08-21 DIAGNOSIS — Z23 Encounter for immunization: Secondary | ICD-10-CM | POA: Diagnosis not present

## 2021-08-29 ENCOUNTER — Other Ambulatory Visit: Payer: Self-pay

## 2021-08-29 ENCOUNTER — Ambulatory Visit (INDEPENDENT_AMBULATORY_CARE_PROVIDER_SITE_OTHER): Payer: Medicare Other | Admitting: Internal Medicine

## 2021-08-29 ENCOUNTER — Encounter: Payer: Self-pay | Admitting: Internal Medicine

## 2021-08-29 VITALS — BP 118/80 | HR 76 | Temp 98.1°F | Resp 18 | Ht 74.0 in | Wt 217.0 lb

## 2021-08-29 DIAGNOSIS — E119 Type 2 diabetes mellitus without complications: Secondary | ICD-10-CM | POA: Diagnosis not present

## 2021-08-29 DIAGNOSIS — E785 Hyperlipidemia, unspecified: Secondary | ICD-10-CM | POA: Diagnosis not present

## 2021-08-29 DIAGNOSIS — I10 Essential (primary) hypertension: Secondary | ICD-10-CM | POA: Diagnosis not present

## 2021-08-29 DIAGNOSIS — R3989 Other symptoms and signs involving the genitourinary system: Secondary | ICD-10-CM

## 2021-08-29 DIAGNOSIS — R6889 Other general symptoms and signs: Secondary | ICD-10-CM | POA: Diagnosis not present

## 2021-08-29 DIAGNOSIS — N400 Enlarged prostate without lower urinary tract symptoms: Secondary | ICD-10-CM

## 2021-08-29 LAB — CBC WITH DIFFERENTIAL/PLATELET
Basophils Absolute: 0 10*3/uL (ref 0.0–0.1)
Basophils Relative: 0.5 % (ref 0.0–3.0)
Eosinophils Absolute: 0.2 10*3/uL (ref 0.0–0.7)
Eosinophils Relative: 4.2 % (ref 0.0–5.0)
HCT: 44.2 % (ref 39.0–52.0)
Hemoglobin: 14.7 g/dL (ref 13.0–17.0)
Lymphocytes Relative: 26.8 % (ref 12.0–46.0)
Lymphs Abs: 1.2 10*3/uL (ref 0.7–4.0)
MCHC: 33.2 g/dL (ref 30.0–36.0)
MCV: 92.4 fl (ref 78.0–100.0)
Monocytes Absolute: 0.5 10*3/uL (ref 0.1–1.0)
Monocytes Relative: 11.3 % (ref 3.0–12.0)
Neutro Abs: 2.6 10*3/uL (ref 1.4–7.7)
Neutrophils Relative %: 57.2 % (ref 43.0–77.0)
Platelets: 147 10*3/uL — ABNORMAL LOW (ref 150.0–400.0)
RBC: 4.78 Mil/uL (ref 4.22–5.81)
RDW: 14.5 % (ref 11.5–15.5)
WBC: 4.5 10*3/uL (ref 4.0–10.5)

## 2021-08-29 LAB — LIPID PANEL
Cholesterol: 165 mg/dL (ref 0–200)
HDL: 48.8 mg/dL (ref >39.00)
LDL Cholesterol: 97 mg/dL (ref 0–99)
NonHDL: 116.65
Total CHOL/HDL Ratio: 3
Triglycerides: 100 mg/dL (ref 0.0–149.0)
VLDL: 20 mg/dL (ref 0.0–40.0)

## 2021-08-29 LAB — COMPREHENSIVE METABOLIC PANEL
ALT: 14 U/L (ref 0–53)
AST: 15 U/L (ref 0–37)
Albumin: 4.6 g/dL (ref 3.5–5.2)
Alkaline Phosphatase: 92 U/L (ref 39–117)
BUN: 20 mg/dL (ref 6–23)
CO2: 30 mEq/L (ref 19–32)
Calcium: 9.8 mg/dL (ref 8.4–10.5)
Chloride: 103 mEq/L (ref 96–112)
Creatinine, Ser: 1.16 mg/dL (ref 0.40–1.50)
GFR: 60.68 mL/min (ref >60.00)
Glucose, Bld: 120 mg/dL — ABNORMAL HIGH (ref 70–99)
Potassium: 4.6 mEq/L (ref 3.5–5.1)
Sodium: 140 mEq/L (ref 135–145)
Total Bilirubin: 0.7 mg/dL (ref 0.2–1.2)
Total Protein: 7 g/dL (ref 6.0–8.3)

## 2021-08-29 LAB — HEMOGLOBIN A1C: Hgb A1c MFr Bld: 6.7 % — ABNORMAL HIGH (ref 4.6–6.5)

## 2021-08-29 LAB — PSA: PSA: 3.74 ng/mL (ref 0.10–4.00)

## 2021-08-29 NOTE — Assessment & Plan Note (Signed)
DM: Currently diet controlled, last A1c 6.7, recheck.  Aware that he might need medication. HTN: Ambulatory BPs are excellent, continue amlodipine, Cardura.  Checking labs. Hyperlipidemia: On Pravachol, check FLP.  Diet is healthy most of the time. BPH, elevated PSA, asymmetric prostate: On today exam the right side of the prostate seem slightly larger than in previous years.  Refer back to urology, check PSA. Preventive care discussed. RTC 4 months

## 2021-08-29 NOTE — Patient Instructions (Addendum)
Check the  blood pressure regulalrly BP GOAL is between 110/65 and  135/85.   If it is consistently higher or lower, let me know  Vaccines I recommend: Shingrix COVID booster   GO TO THE LAB : Get the blood work     [place], [place] back for   a checkup in 4 months   "Living will", "[place] of attorney": Advanced care planning  (If you already have a living will or healthcare power of attorney, please bring the copy to be scanned in your chart.)  Advance care planning is a process that supports adults in  understanding and sharing their preferences regarding future medical care.   The patient's preferences are recorded in documents called Advance Directives.    Advanced directives are completed (and can be modified at any time) while the patient is in full mental capacity.   The documentation should be available at all times to the patient, the family and the healthcare providers.  Bring in a copy to be scanned in your chart is an excellent idea and is recommended   This legal documents direct treatment decision making and/or appoint a surrogate to make the decision if the patient is not capable to do so.    Advance directives can be documented in many types of formats,  documents have names such as:  Lliving will  Durable power of attorney for healthcare (healthcare proxy or healthcare power of attorney)  Combined directives  Physician orders for life-sustaining treatment    More information at:  [email or web address]

## 2021-08-29 NOTE — Assessment & Plan Note (Signed)
Preventive care reviewed -Td  01-2011 -  pneumonia shot 2010;  prevnar-- 2015 - zostavax 07-22-10; shingrix recommended  -  covid vax  booster rec - had a  flu shot -XBD:ZHGDJMEQAST 2004,Dr Santogade, colonoscopy again 05-2010 -Dr Michail Sermon-, hyperplastic polyps, cscope 06-2015: no polyps, cacope 11-2020, next per GI GI -Prostate cancer screening: See comments under BPH, referred to urology.  - H-POA d/w pt

## 2021-08-29 NOTE — Progress Notes (Signed)
Subjective:    Patient ID: Thomas Bradley, male    DOB: 09-20-1946, 75 y.o.   MRN: [id number]  DOS:  08/29/2021 Type of visit - description: f/u  Several issues discussed. DM: Diet is healthy. Ambulatory BPs are normal History of abnormal DRE, denies any dysuria or difficulty urinating.  Nocturia is at baseline, 3-4 times a night.  No gross hematuria  Review of Systems See above   Past Medical History:  Diagnosis Date   Benign prostatic hypertrophy    ED, sees urology every year   BPH (benign prostatic hypertrophy) with urinary obstruction    Dr. Risa Grill   Elevated PSA    Dr. Risa Grill   Erectile dysfunction due to arterial insufficiency    Dr. Risa Grill   Erythema multiforme    dx 2013    GERD (gastroesophageal reflux disease)    Hyperlipemia    Hypertension    Nocturia    Prostatitis    2010, again 09-2011    Past Surgical History:  Procedure Laterality Date   HERNIA REPAIR  07-2010   open, R inguinal w/ mesh   TOTAL HIP ARTHROPLASTY Left 10/10/2019   Procedure: LEFT TOTAL HIP ARTHROPLASTY ANTERIOR APPROACH;  Surgeon: Frederik Pear, MD;  Location: [facility];  Service: Orthopedics;  Laterality: Left;   Social History   Socioeconomic History   Marital status: Married    Spouse name: Not on file   Number of children: 2   Years of education: Not on file   Highest education level: Not on file  Occupational History   Occupation: retired   Tobacco Use   Smoking status: Former    Types: Cigarettes    Quit date: 11/13/1976    Years since quitting: 44.8   Smokeless tobacco: Never  Vaping Use   Vaping Use: Never used  Substance and Sexual Activity   Alcohol use: No   Drug use: No   Sexual activity: Never  Other Topics Concern   Not on file  Social History Narrative   Has 3 acres, works in the yard a lot x 6 hours sometimes, feels well.     4 G-kids        Social Determinants of Health   Financial Resource Strain: Not on file  Food Insecurity: Not on file   Transportation Needs: Not on file  Physical Activity: Not on file  Stress: Not on file  Social Connections: Not on file  Intimate Partner Violence: Not on file    Allergies as of 08/29/2021   No Known Allergies      Medication List        Accurate as of August 29, 2021  8:54 PM. If you have any questions, ask your nurse or doctor.          amLODipine 10 MG tablet Commonly known as: NORVASC TAKE 1 TABLET(10 MG) BY MOUTH DAILY   B-12 2500 MCG Tabs Take 1 tablet by mouth daily. Reported on 02/01/2016   doxazosin 4 MG tablet Commonly known as: CARDURA Take 1 tablet (4 mg total) by mouth daily.   Krill Oil 500 MG Caps Take 500 mg by mouth daily.   multivitamin capsule Take 1 capsule by mouth daily. Reported on 02/01/2016   pravastatin 40 MG tablet Commonly known as: PRAVACHOL Take 1 tablet (40 mg total) by mouth at bedtime.   Vitamin D3 50 MCG (2000 UT) Tabs Take 1 tablet by mouth daily. Reported on 02/01/2016   vitamin E 180 MG (400 UNITS)  capsule Take 400 mg by mouth daily.           Objective:   Physical Exam BP 118/80   Pulse 76   Temp 98.1 F (36.7 C)   Resp 18   Ht 6\' 2"  (1.88 m)   Wt 217 lb (98.4 kg)   SpO2 98%   BMI 27.86 kg/m  General: Well developed, NAD, BMI noted Neck: No  thyromegaly  HEENT:  Normocephalic . Face symmetric, atraumatic Lungs:  CTA B Normal respiratory effort, no intercostal retractions, no accessory muscle use. Heart: RRR,  no murmur.  Abdomen:  Not distended, soft, non-tender. No rebound or rigidity.   Lower extremities: no pretibial edema bilaterally DRE:  Normal sphincter tone, brown stools. Right side of the prostate is definitely larger and more firm compared to the left.  Not tender Skin: Exposed areas without rash. Not pale. Not jaundice Neurologic:  alert & oriented X3.  Speech normal, gait appropriate for age and unassisted Strength symmetric and appropriate for age.  Psych: Cognition and judgment  appear intact.  Cooperative with normal attention span and concentration.  Behavior appropriate. No anxious or depressed appearing.     Assessment     Assessment DM HTN (intol to losartan, 2018) Hyperlipidemia GERD Erythema multiforme 2013 GU: --BPH w/ elevated PSA, asymmetric prostate --Prostatitis 2010, 2012 --ED Has a epi-pen -- reason? Pt not sure, had an allergy to a medicine, name?  PLAN:    DM: Currently diet controlled, last A1c 6.7, recheck.  Aware that he might need medication. HTN: Ambulatory BPs are excellent, continue amlodipine, Cardura.  Checking labs. Hyperlipidemia: On Pravachol, check FLP.  Diet is healthy most of the time. BPH, elevated PSA, asymmetric prostate: On today exam the right side of the prostate seem slightly larger than in previous years.  Refer back to urology, check PSA. Preventive care discussed. RTC 4 months    This visit occurred during the SARS-CoV-2 public health emergency.  Safety protocols were in place, including screening questions prior to the visit, additional usage of staff PPE, and extensive cleaning of exam room while observing appropriate contact time as indicated for disinfecting solutions.

## 2021-09-07 ENCOUNTER — Other Ambulatory Visit: Payer: Self-pay | Admitting: Internal Medicine

## 2021-10-11 DIAGNOSIS — R351 Nocturia: Secondary | ICD-10-CM | POA: Diagnosis not present

## 2021-10-11 DIAGNOSIS — N401 Enlarged prostate with lower urinary tract symptoms: Secondary | ICD-10-CM | POA: Diagnosis not present

## 2021-12-03 DIAGNOSIS — H35033 Hypertensive retinopathy, bilateral: Secondary | ICD-10-CM | POA: Diagnosis not present

## 2021-12-03 LAB — HM DIABETES EYE EXAM

## 2021-12-05 ENCOUNTER — Encounter: Payer: Self-pay | Admitting: Internal Medicine

## 2021-12-06 ENCOUNTER — Other Ambulatory Visit: Payer: Self-pay | Admitting: Internal Medicine

## 2021-12-19 DIAGNOSIS — K529 Noninfective gastroenteritis and colitis, unspecified: Secondary | ICD-10-CM | POA: Diagnosis not present

## 2021-12-20 ENCOUNTER — Ambulatory Visit (INDEPENDENT_AMBULATORY_CARE_PROVIDER_SITE_OTHER): Payer: Medicare Other | Admitting: Family

## 2021-12-20 ENCOUNTER — Encounter: Payer: Self-pay | Admitting: Family

## 2021-12-20 VITALS — BP 98/60 | HR 89 | Temp 98.2°F | Ht 74.0 in | Wt 214.4 lb

## 2021-12-20 DIAGNOSIS — R197 Diarrhea, unspecified: Secondary | ICD-10-CM | POA: Diagnosis not present

## 2021-12-20 LAB — CBC WITH DIFFERENTIAL/PLATELET
Absolute Monocytes: 1066 cells/uL — ABNORMAL HIGH (ref 200–950)
Basophils Absolute: 19 cells/uL (ref 0–200)
Basophils Relative: 0.2 %
Eosinophils Absolute: 86 cells/uL (ref 15–500)
Eosinophils Relative: 0.9 %
HCT: 45.1 % (ref 38.5–50.0)
Hemoglobin: 15.1 g/dL (ref 13.2–17.1)
Lymphs Abs: 989 cells/uL (ref 850–3900)
MCH: 30.9 pg (ref 27.0–33.0)
MCHC: 33.5 g/dL (ref 32.0–36.0)
MCV: 92.4 fL (ref 80.0–100.0)
MPV: 11.2 fL (ref 7.5–12.5)
Monocytes Relative: 11.1 %
Neutro Abs: 7440 cells/uL (ref 1500–7800)
Neutrophils Relative %: 77.5 %
Platelets: 168 10*3/uL (ref 140–400)
RBC: 4.88 10*6/uL (ref 4.20–5.80)
RDW: 13.1 % (ref 11.0–15.0)
Total Lymphocyte: 10.3 %
WBC: 9.6 10*3/uL (ref 3.8–10.8)

## 2021-12-20 LAB — COMPREHENSIVE METABOLIC PANEL
AG Ratio: 1.9 (calc) (ref 1.0–2.5)
ALT: 13 U/L (ref 9–46)
AST: 13 U/L (ref 10–35)
Albumin: 3.6 g/dL (ref 3.6–5.1)
Alkaline phosphatase (APISO): 70 U/L (ref 35–144)
BUN: 15 mg/dL (ref 7–25)
CO2: 25 mmol/L (ref 20–32)
Calcium: 9 mg/dL (ref 8.6–10.3)
Chloride: 103 mmol/L (ref 98–110)
Creat: 1.23 mg/dL (ref 0.70–1.28)
Globulin: 1.9 g/dL (calc) (ref 1.9–3.7)
Glucose, Bld: 113 mg/dL — ABNORMAL HIGH (ref 65–99)
Potassium: 3.8 mmol/L (ref 3.5–5.3)
Sodium: 139 mmol/L (ref 135–146)
Total Bilirubin: 1 mg/dL (ref 0.2–1.2)
Total Protein: 5.5 g/dL — ABNORMAL LOW (ref 6.1–8.1)

## 2021-12-20 MED ORDER — CIPROFLOXACIN HCL 500 MG PO TABS: 500.0000 mg | ORAL_TABLET | Freq: Two times a day (BID) | ORAL | 0 refills | Status: DC

## 2021-12-20 NOTE — Progress Notes (Signed)
Thomas Bradley is a 76 y.o. male with the following history as recorded in Epic[employer]:  Patient Active Problem List   Diagnosis Date Noted   AVN of femur ([place]) 10/07/2019   PCP NOTES >>>>>>>>>>>>>>>>> 02/01/2016   Diabetes mellitus without complication ([place]) 31/51/7616   Rhinitis 08/30/2012   DJD (degenerative joint disease) 03/08/2012   Annual physical exam 01/28/2011   INGUINAL HERNIA, RIGHT 01/23/2009   GERD 09/20/2008   Dyslipidemia 03/08/2007   Essential hypertension 03/08/2007   BPH (benign prostatic hyperplasia) 03/08/2007    Current Outpatient Medications  Medication Sig Dispense Refill   amLODipine (NORVASC) 10 MG tablet TAKE 1 TABLET(10 MG) BY MOUTH DAILY 90 tablet 1   Cholecalciferol (VITAMIN D3) 2000 UNITS TABS Take 1 tablet by mouth daily. Reported on 02/01/2016     Cyanocobalamin (B-12) 2500 MCG TABS Take 1 tablet by mouth daily. Reported on 02/01/2016     doxazosin (CARDURA) 4 MG tablet TAKE 1 TABLET(4 MG) BY MOUTH DAILY 90 tablet 1   Krill Oil 500 MG CAPS Take 500 mg by mouth daily.     Multiple Vitamin (MULTIVITAMIN) capsule Take 1 capsule by mouth daily. Reported on 02/01/2016     pravastatin (PRAVACHOL) 40 MG tablet TAKE 1 TABLET(40 MG) BY MOUTH AT BEDTIME 90 tablet 1   vitamin E 400 UNIT capsule Take 400 mg by mouth daily.      No current facility-administered medications for this visit.    Allergies: Patient has no known allergies.  Past Medical History:  Diagnosis Date   Benign prostatic hypertrophy    ED, sees urology every year   BPH (benign prostatic hypertrophy) with urinary obstruction    Dr. Risa Grill   Elevated PSA    Dr. Risa Grill   Erectile dysfunction due to arterial insufficiency    Dr. Risa Grill   Erythema multiforme    dx 2013    GERD (gastroesophageal reflux disease)    Hyperlipemia    Hypertension    Nocturia    Prostatitis    2010, again 09-2011    Past Surgical History:  Procedure Laterality Date   HERNIA REPAIR  07-2010   open, R inguinal w/  mesh   TOTAL HIP ARTHROPLASTY Left 10/10/2019   Procedure: LEFT TOTAL HIP ARTHROPLASTY ANTERIOR APPROACH;  Surgeon: Frederik Pear, MD;  Location: [facility];  Service: Orthopedics;  Laterality: Left;    Family History  Problem Relation Age of Onset   Coronary artery disease Father 72   Hypertension Sister    Diabetes Brother    Stroke Neg Hx    Colon cancer Neg Hx    Prostate cancer Neg Hx     Social History   Tobacco Use   Smoking status: Former    Types: Cigarettes    Quit date: 11/13/1976    Years since quitting: 45.1   Smokeless tobacco: Never  Substance Use Topics   Alcohol use: No    Subjective:  Accompanied by wife today; started with diarrhea 8 days ago; notes that did eat undercooked hamburger from restaurant prior to onset of symptoms; no recent antibiotic use; has been taking OTC Imodium with limited benefit; denies fever or vomiting; able to keep Gatorade down;    Objective:  Vitals:   12/20/21 1335  BP: 98/60  Pulse: 89  Temp: 98.2 F (36.8 C)  TempSrc: Oral  SpO2: 97%  Weight: 214 lb 6.4 oz (97.3 kg)  Height: 6\' 2"  (1.88 m)    General: Well developed, well nourished, in no  acute distress  Skin : Warm and dry.  Head: Normocephalic and atraumatic  Lungs: Respirations unlabored; clear to auscultation bilaterally without wheeze, rales, rhonchi  CVS exam: normal rate and regular rhythm.  Abdomen: Soft; nontender; nondistended; normoactive bowel sounds; no masses or hepatosplenomegaly  Neurologic: Alert and oriented; speech intact; face symmetrical; moves all extremities well; CNII-XII intact without focal deficit   Assessment:  1. Diarrhea, unspecified type     Plan:  Concern for infectious source- food borne? Will update stool culture today; check CBC, CMP today as well; he will bring his culture back this afternoon and then start Cipro to try get some relief/ management of symptoms with upcoming weekend; follow up to be determined;  ER precautions/ BRAT diet  discussed;   This visit occurred during the SARS-CoV-2 public health emergency.  Safety protocols were in place, including screening questions prior to the visit, additional usage of staff PPE, and extensive cleaning of exam room while observing appropriate contact time as indicated for disinfecting solutions.      No follow-ups on file.  Orders Placed This Encounter  Procedures   GI Profile, Stool, PCR    Requested Prescriptions    No prescriptions requested or ordered in this encounter

## 2021-12-23 ENCOUNTER — Other Ambulatory Visit: Payer: Self-pay | Admitting: Family

## 2021-12-23 LAB — GI PROFILE, STOOL, PCR

## 2021-12-23 MED ORDER — VANCOMYCIN HCL 125 MG PO CAPS: 125.0000 mg | ORAL_CAPSULE | Freq: Four times a day (QID) | ORAL | 0 refills | Status: AC

## 2021-12-25 HISTORY — PX: OTHER SURGICAL HISTORY: SHX169

## 2021-12-30 ENCOUNTER — Ambulatory Visit (INDEPENDENT_AMBULATORY_CARE_PROVIDER_SITE_OTHER): Payer: Medicare Other

## 2021-12-30 ENCOUNTER — Telehealth: Payer: Self-pay | Admitting: Interventional Cardiology

## 2021-12-30 ENCOUNTER — Encounter: Payer: Self-pay | Admitting: Internal Medicine

## 2021-12-30 ENCOUNTER — Ambulatory Visit (INDEPENDENT_AMBULATORY_CARE_PROVIDER_SITE_OTHER): Payer: Medicare Other | Admitting: Internal Medicine

## 2021-12-30 VITALS — BP 124/72 | HR 97 | Temp 97.7°F | Resp 16 | Ht 74.0 in | Wt 212.4 lb

## 2021-12-30 DIAGNOSIS — I4821 Permanent atrial fibrillation: Secondary | ICD-10-CM | POA: Insufficient documentation

## 2021-12-30 DIAGNOSIS — I4891 Unspecified atrial fibrillation: Secondary | ICD-10-CM

## 2021-12-30 DIAGNOSIS — N401 Enlarged prostate with lower urinary tract symptoms: Secondary | ICD-10-CM

## 2021-12-30 DIAGNOSIS — E119 Type 2 diabetes mellitus without complications: Secondary | ICD-10-CM

## 2021-12-30 DIAGNOSIS — I499 Cardiac arrhythmia, unspecified: Secondary | ICD-10-CM

## 2021-12-30 DIAGNOSIS — I10 Essential (primary) hypertension: Secondary | ICD-10-CM | POA: Diagnosis not present

## 2021-12-30 DIAGNOSIS — I480 Paroxysmal atrial fibrillation: Secondary | ICD-10-CM | POA: Insufficient documentation

## 2021-12-30 HISTORY — DX: Unspecified atrial fibrillation: I48.91

## 2021-12-30 LAB — BASIC METABOLIC PANEL
BUN: 17 mg/dL (ref 6–23)
CO2: 29 mEq/L (ref 19–32)
Calcium: 9.1 mg/dL (ref 8.4–10.5)
Chloride: 103 mEq/L (ref 96–112)
Creatinine, Ser: 1.13 mg/dL (ref 0.40–1.50)
GFR: 62.47 mL/min (ref >60.00)
Glucose, Bld: 121 mg/dL — ABNORMAL HIGH (ref 70–99)
Potassium: 4.3 mEq/L (ref 3.5–5.1)
Sodium: 140 mEq/L (ref 135–145)

## 2021-12-30 LAB — TSH: TSH: 1.7 u[IU]/mL (ref 0.35–5.50)

## 2021-12-30 LAB — HEMOGLOBIN A1C: Hgb A1c MFr Bld: 7.1 % — ABNORMAL HIGH (ref 4.6–6.5)

## 2021-12-30 LAB — MAGNESIUM: Magnesium: 2.2 mg/dL (ref 1.5–2.5)

## 2021-12-30 MED ORDER — APIXABAN 5 MG PO TABS: 5.0000 mg | ORAL_TABLET | Freq: Two times a day (BID) | ORAL | 3 refills | Status: DC

## 2021-12-30 NOTE — Progress Notes (Unsigned)
Enrolled patient for a 7 day Zio XT monitor to be mailed to patients home ? ?Patient being scheduled new patient appt  also ?

## 2021-12-30 NOTE — Progress Notes (Signed)
? ?Subjective:  ? ? Patient ID: Thomas Bradley, male    DOB: Oct 22, 1946, 76 y.o.   MRN: [id number] ? ?DOS:  12/30/2021 ?Type of visit - description: Follow-up ? ?Since the last office visit is doing well. ?His brother has advanced heart disease and the patient is concerned about his heart health, request calcium coronary score. ? ?On exam , he was noted to be on  A Fib. EKG confirmatory ?He denies chest pain or difficulty breathing ?When he is active, he does not have DOE. ? ?Also, had diarrhea (with some nausea but with no blood in the stools, fever chills), seen at this office 12-20-21,  test for C. difficile came back positive, on Vanco, feeling better. ? ? ?Review of Systems ?See above  ? ?Past Medical History:  ?Diagnosis Date  ? Benign prostatic hypertrophy   ? ED, sees urology every year  ? BPH (benign prostatic hypertrophy) with urinary obstruction   ? Dr. Risa Grill  ? Elevated PSA   ? Dr. Risa Grill  ? Erectile dysfunction due to arterial insufficiency   ? Dr. Risa Grill  ? Erythema multiforme   ? dx 2013   ? GERD (gastroesophageal reflux disease)   ? Hyperlipemia   ? Hypertension   ? Nocturia   ? Prostatitis   ? 2010, again 09-2011  ? ? ?Past Surgical History:  ?Procedure Laterality Date  ? HERNIA REPAIR  07-2010  ? open, R inguinal w/ mesh  ? TOTAL HIP ARTHROPLASTY Left 10/10/2019  ? Procedure: LEFT TOTAL HIP ARTHROPLASTY ANTERIOR APPROACH;  Surgeon: Frederik Pear, MD;  Location: [facility];  Service: Orthopedics;  Laterality: Left;  ? ? ?Current Outpatient Medications  ?Medication Instructions  ? amLODipine (NORVASC) 10 MG tablet TAKE 1 TABLET(10 MG) BY MOUTH DAILY  ? Cholecalciferol (VITAMIN D3) 2000 UNITS TABS 1 tablet, Oral, Daily, Reported on 02/01/2016  ? Cyanocobalamin (B-12) 2500 MCG TABS 1 tablet, Oral, Daily, Reported on 02/01/2016  ? doxazosin (CARDURA) 4 MG tablet TAKE 1 TABLET(4 MG) BY MOUTH DAILY  ? Krill Oil 500 mg, Oral, Daily  ? Multiple Vitamin (MULTIVITAMIN) capsule 1 capsule, Oral, Daily, Reported on 02/01/2016   ? pravastatin (PRAVACHOL) 40 MG tablet TAKE 1 TABLET(40 MG) BY MOUTH AT BEDTIME  ? vancomycin (VANCOCIN) 125 mg, Oral, 4 times daily  ? vitamin E 400 mg, Oral, Daily  ? ? ?   ?Objective:  ? Physical Exam ?BP 124/72 (BP Location: Left Arm, Patient Position: Sitting, Cuff Size: Normal)   Pulse 97   Temp 97.7 ?F (36.5 ?C) (Oral)   Resp 16   Ht '6\' 2"'$  (1.88 m)   Wt 212 lb 6 oz (96.3 kg)   SpO2 98%   BMI 27.27 kg/m?  ?General:   ?Well developed, NAD, BMI noted.  ?HEENT:  ?Normocephalic . Face symmetric, atraumatic ?Lungs:  ?CTA B ?Normal respiratory effort, no intercostal retractions, no accessory muscle use. ?Heart:: Regular? ?Abdomen:  ?Not distended, soft, non-tender. No rebound or rigidity.   ?Skin: Not pale. Not jaundice ?Lower extremities: no pretibial edema bilaterally  ?Neurologic:  ?alert & oriented X3.  ?Speech normal, gait appropriate for age and unassisted ?Psych--  ?Cognition and judgment appear intact.  ?Cooperative with normal attention span and concentration.  ?Behavior appropriate. ?No anxious or depressed appearing. ? ?   ?Assessment   ? ?Assessment ?DM ?HTN (intol to losartan, 2018) ?Hyperlipidemia ?GERD ?Erythema multiforme 2013 ?GU: ?--BPH w/ elevated PSA, asymmetric prostate ?--Prostatitis 2010, 2012 ?--ED ?Has a epi-pen -- reason? Pt not sure, had  an allergy to a medicine, name? ? ?PLAN:    ?Atrial fibrillation, new onset: ?During today's exam, he is heart rate was irregular.  EKG confirms atrial fibrillation.  He is asymptomatic. ?CHADS2 score 4 ?Case discussed with Dr. Tamala Julian, appreciate his input. ?Plan: ?Eliquis 5 mg twice daily, risks and benefits discussed ?TSH ?Echo, Zio patch 7 days and cardiology referral ?Patient educated about the diagnosis and the use of anticoagulants. ?Diarrhea: Seen at this office 12/20/2021 with diarrhea,GI profile showed C. difficile toxin detected.  On vancomycin.  Feeling better.   Checking electrolytes ?DM: Back in November, A1c was 6.7.  Diet controlled.   Check A1c. ?HTN: Controlled, on amlodipine, Cardura.  Last BMP and CBC okay. ?High cholesterol: Last LDL 97.  Continue Pravachol.    ?BPH, LUTS, asymmetric prostate: ?Saw urology December 2022: He was recommended extensive work-up, patient declined . ?They agreed to continue with prostate cancer screening for now. ?RTC 3 months  ? ?Time spent 41 min: new onset A Fib, review of chronic medical issues, pt care coordination. ? ?This visit occurred during the SARS-CoV-2 public health emergency.  Safety protocols were in place, including screening questions prior to the visit, additional usage of staff PPE, and extensive cleaning of exam room while observing appropriate contact time as indicated for disinfecting solutions.  ? ?

## 2021-12-30 NOTE — Telephone Encounter (Signed)
Pt with new onset AF.  Dr. Larose Kells spoke with Dr. Tamala Julian and discussed treatment plan including an echo and 7 day monitor.  Dr. Larose Kells will place a referral so pt can be seen.   ?

## 2021-12-30 NOTE — Assessment & Plan Note (Signed)
Atrial fibrillation, new onset: ?During today's exam, he is heart rate was irregular.  EKG confirms atrial fibrillation.  He is asymptomatic. ?CHADS2 score 4 ?Case discussed with Dr. Tamala Julian, appreciate his input. ?Plan: ?Eliquis 5 mg twice daily, risks and benefits discussed ?TSH ?Echo, Zio patch 7 days and cardiology referral ?Patient educated about the diagnosis and the use of anticoagulants. ?Diarrhea: Seen at this office 12/20/2021 with diarrhea,GI profile showed C. difficile toxin detected.  On vancomycin.  Feeling better.   Checking electrolytes ?DM: Back in November, A1c was 6.7.  Diet controlled.  Check A1c. ?HTN: Controlled, on amlodipine, Cardura.  Last BMP and CBC okay. ?High cholesterol: Last LDL 97.  Continue Pravachol.    ?BPH, LUTS, asymmetric prostate: ?Saw urology December 2022: He was recommended extensive work-up, patient declined . ?They agreed to continue with prostate cancer screening for now. ?RTC 3 months  ?

## 2021-12-30 NOTE — Patient Instructions (Addendum)
Per our records you are due for your diabetic eye exam. Please contact your eye doctor to schedule an appointment. Please have them send copies of your office visit notes to [place]. Our fax number is [phone]. If you need a referral to an eye doctor please let us know.   Start a medication called Eliquis 5 mg: 1 tablet twice daily. This is an anticoagulant, seek medical attention immediately if you see blood in the stools, in the urine.  Also you have severe headache, stomach pain.  We are referring you to cardiology. We are ordering patch and echocardiogram  Finish vancomycin  Check the  blood pressure regularly BP GOAL is between 110/65 and  135/85. If it is consistently higher or lower, let me know      GO TO THE LAB : Get the blood work     [place], [place] back for a checkup in 3 months       Atrial Fibrillation Atrial fibrillation is a type of heartbeat that is irregular or fast. If you have this condition, your heart beats without any order. This makes it hard for your heart to pump blood in a normal way. Atrial fibrillation may come and go, or it may become a long-lasting problem. If this condition is not treated, it can put you at higher risk for stroke, heart failure, and other heart problems. What are the causes? This condition may be caused by diseases that damage the heart. They include: High blood pressure. Heart failure. Heart valve disease. Heart surgery. Other causes include: Diabetes. Thyroid disease. Being overweight. Kidney disease. Sometimes the cause is not known. What increases the risk? You are more likely to develop this condition if: You are older. You smoke. You exercise often and very hard. You have a family history of this condition. You are a man. You use drugs. You drink a lot of alcohol. You have lung conditions, such as emphysema, pneumonia, or COPD. You have sleep apnea. What are the  signs or symptoms? Common symptoms of this condition include: A feeling that your heart is beating very fast. Chest pain or discomfort. Feeling short of breath. Suddenly feeling light-headed or weak. Getting tired easily during activity. Fainting. Sweating. In some cases, there are no symptoms. How is this treated? Treatment for this condition depends on underlying conditions and how you feel when you have atrial fibrillation. They include: Medicines to: Prevent blood clots. Treat heart rate or heart rhythm problems. Using devices, such as a pacemaker, to correct heart rhythm problems. Doing surgery to remove the part of the heart that sends bad signals. Closing an area where clots can form in the heart (left atrial appendage). In some cases, your doctor will treat other underlying conditions. Follow these instructions at home: Medicines Take over-the-counter and prescription medicines only as told by your doctor. Do not take any new medicines without first talking to your doctor. If you are taking blood thinners: Talk with your doctor before you take any medicines that have aspirin or NSAIDs, such as ibuprofen, in them. Take your medicine exactly as told by your doctor. Take it at the same time each day. Avoid activities that could hurt or bruise you. Follow instructions about how to prevent falls. Wear a bracelet that says you are taking blood thinners. Or, carry a card that lists what medicines you take. Lifestyle   Do not use any products that have nicotine or tobacco in them. These  include cigarettes, e-cigarettes, and chewing tobacco. If you need help quitting, ask your doctor. Eat heart-healthy foods. Talk with your doctor about the right eating plan for you. Exercise regularly as told by your doctor. Do not drink alcohol. Lose weight if you are overweight. Do not use drugs, including cannabis. General instructions If you have a condition that causes breathing to stop for  a short period of time (apnea), treat it as told by your doctor. Keep a healthy weight. Do not use diet pills unless your doctor says they are safe for you. Diet pills may make heart problems worse. Keep all follow-up visits as told by your doctor. This is important. Contact a doctor if: You notice a change in the speed, rhythm, or strength of your heartbeat. You are taking a blood-thinning medicine and you get more bruising. You get tired more easily when you move or exercise. You have a sudden change in weight. Get help right away if:  You have pain in your chest or your belly (abdomen). You have trouble breathing. You have side effects of blood thinners, such as blood in your vomit, poop (stool), or pee (urine), or bleeding that cannot stop. You have any signs of a stroke. "BE FAST" is an easy way to remember the main warning signs: B - Balance. Signs are dizziness, sudden trouble walking, or loss of balance. E - Eyes. Signs are trouble seeing or a change in how you see. F - Face. Signs are sudden weakness or loss of feeling in the face, or the face or eyelid drooping on one side. A - Arms. Signs are weakness or loss of feeling in an arm. This happens suddenly and usually on one side of the body. S - Speech. Signs are sudden trouble speaking, slurred speech, or trouble understanding what people say. T - Time. Time to call emergency services. Write down what time symptoms started. You have other signs of a stroke, such as: A sudden, very bad headache with no known cause. Feeling like you may vomit (nausea). Vomiting. A seizure. These symptoms may be an emergency. Do not wait to see if the symptoms will go away. Get medical help right away. Call your local emergency services (911 in the [place] Do not drive yourself to the hospital. Summary Atrial fibrillation is a type of heartbeat that is irregular or fast. You are at higher risk of this condition if you smoke, are older, have diabetes,  or are overweight. Follow your doctor's instructions about medicines, diet, exercise, and follow-up visits. Get help right away if you have signs or symptoms of a stroke. Get help right away if you cannot catch your breath, or you have chest pain or discomfort. This information is not intended to replace advice given to you by your health care provider. Make sure you discuss any questions you have with your health care provider. Document Revised: 04/06/2019 Document Reviewed: 04/06/2019 Elsevier Patient Education  [place].    Apixaban Tablets What is this medication? APIXABAN (a PIX a ban) prevents or treats blood clots. It is also used to lower the risk of stroke in people with AFib (atrial fibrillation). It belongs to a group of medications called blood thinners. This medicine may be used for other purposes; ask your health care provider or pharmacist if you have questions. COMMON BRAND NAME(S): Eliquis What should I tell my care team before I take this medication? They need to know if you have any of these conditions: Antiphospholipid antibody syndrome  Bleeding disorder History of bleeding in the brain History of blood clots History of stomach bleeding Kidney disease Liver disease Mechanical heart valve Spinal surgery An unusual or allergic reaction to apixaban, other medications, foods, dyes, or preservatives Pregnant or trying to get pregnant Breast-feeding How should I use this medication? Take this medication by mouth. For your therapy to work as well as possible, take each dose exactly as prescribed on the prescription label. Do not skip doses. Skipping doses or stopping this medication can increase your risk of a blood clot or stroke. Keep taking this medication unless your care team tells you to stop. Take it as directed on the prescription label at the same time every day. You can take it with or without food. If it upsets your stomach, take it with food. A  special MedGuide will be given to you by the pharmacist with each prescription and refill. Be sure to read this information carefully each time. Talk to your care team about the use of this medication in children. Special care may be needed. Overdosage: If you think you have taken too much of this medicine contact a poison control center or emergency room at once. NOTE: This medicine is only for you. Do not share this medicine with others. What if I miss a dose? If you miss a dose, take it as soon as you can. If it is almost time for your next dose, take only that dose. Do not take double or extra doses. What may interact with this medication? This medication may interact with the following: Aspirin and aspirin-like medications Certain medications for fungal infections like itraconazole and ketoconazole Certain medications for seizures like carbamazepine and phenytoin Certain medications for blood clots like enoxaparin, dalteparin, heparin, and warfarin Clarithromycin NSAIDs, medications for pain and inflammation, like ibuprofen or naproxen Rifampin Ritonavir St. John's wort This list may not describe all possible interactions. Give your health care provider a list of all the medicines, herbs, non-prescription drugs, or dietary supplements you use. Also tell them if you smoke, drink alcohol, or use illegal drugs. Some items may interact with your medicine. What should I watch for while using this medication? Visit your healthcare professional for regular checks on your progress. You may need blood work done while you are taking this medication. Your condition will be monitored carefully while you are receiving this medication. It is important not to miss any appointments. Avoid sports and activities that might cause injury while you are using this medication. Severe falls or injuries can cause unseen bleeding. Be careful when using sharp tools or knives. Consider using an Copy. Take  special care brushing or flossing your teeth. Report any injuries, bruising, or red spots on the skin to your healthcare professional. If you are going to need surgery or other procedure, tell your healthcare professional that you are taking this medication. Wear a medical ID bracelet or chain. Carry a card that describes your disease and details of your medication and dosage times. What side effects may I notice from receiving this medication? Side effects that you should report to your care team as soon as possible: Allergic reactions--skin rash, itching, hives, swelling of the face, lips, tongue, or throat Bleeding--bloody or black, tar-like stools, vomiting blood or brown material that looks like coffee grounds, red or dark brown urine, small red or purple spots on the skin, unusual bruising or bleeding Bleeding in the brain--severe headache, stiff neck, confusion, dizziness, change in vision, numbness or weakness of the  face, arm, or leg, trouble speaking, trouble walking, vomiting Heavy periods This list may not describe all possible side effects. Call your doctor for medical advice about side effects. You may report side effects to FDA at 1-800-FDA-1088. Where should I keep my medication? Keep out of the reach of children and pets. Store at room temperature between 20 and 25 degrees C (68 and 77 degrees F). Get rid of any unused medication after the expiration date. To get rid of medications that are no longer needed or expired: Take the medication to a medication take-back program. Check with your pharmacy or law enforcement to find a location. If you cannot return the medication, check the label or package insert to see if the medication should be thrown out in the garbage or flushed down the toilet. If you are not sure, ask your care team. If it is safe to put in the trash, empty the medication out of the container. Mix the medication with cat litter, dirt, coffee grounds, or other unwanted  substance. Seal the mixture in a bag or container. Put it in the trash. NOTE: This sheet is a summary. It may not cover all possible information. If you have questions about this medicine, talk to your doctor, pharmacist, or health care provider.  2022 Elsevier/Gold Standard (2020-11-09 00:00:00)

## 2021-12-30 NOTE — Telephone Encounter (Signed)
Thomas Bradley from [facility] states Dr. Larose Kells is requesting to speak with DOD. ?

## 2022-01-01 ENCOUNTER — Ambulatory Visit (HOSPITAL_COMMUNITY)
Admission: RE | Admit: 2022-01-01 | Discharge: 2022-01-01 | Disposition: A | Discharge status: Home / Self Care | Payer: Medicare Other | Source: Ambulatory Visit | Attending: Internal Medicine | Admitting: Internal Medicine

## 2022-01-01 ENCOUNTER — Other Ambulatory Visit: Payer: Self-pay

## 2022-01-01 DIAGNOSIS — I4891 Unspecified atrial fibrillation: Secondary | ICD-10-CM | POA: Insufficient documentation

## 2022-01-01 DIAGNOSIS — I10 Essential (primary) hypertension: Secondary | ICD-10-CM | POA: Insufficient documentation

## 2022-01-01 DIAGNOSIS — I340 Nonrheumatic mitral (valve) insufficiency: Secondary | ICD-10-CM | POA: Insufficient documentation

## 2022-01-01 DIAGNOSIS — E785 Hyperlipidemia, unspecified: Secondary | ICD-10-CM | POA: Diagnosis not present

## 2022-01-01 HISTORY — PX: TRANSTHORACIC ECHOCARDIOGRAM: SHX275

## 2022-01-01 LAB — ECHOCARDIOGRAM COMPLETE
MV M vel: 4.62 m/s
MV Peak grad: 85.4 mmHg
S' Lateral: 2.8 cm

## 2022-01-14 ENCOUNTER — Ambulatory Visit (INDEPENDENT_AMBULATORY_CARE_PROVIDER_SITE_OTHER): Payer: Medicare Other | Admitting: Family

## 2022-01-14 ENCOUNTER — Encounter: Payer: Self-pay | Admitting: Family

## 2022-01-14 VITALS — BP 100/60 | HR 85 | Temp 98.6°F | Resp 17 | Ht 73.0 in | Wt 212.6 lb

## 2022-01-14 DIAGNOSIS — R197 Diarrhea, unspecified: Secondary | ICD-10-CM

## 2022-01-14 DIAGNOSIS — Z8619 Personal history of other infectious and parasitic diseases: Secondary | ICD-10-CM

## 2022-01-14 MED ORDER — VANCOMYCIN HCL 125 MG PO CAPS
125.0000 mg | ORAL_CAPSULE | Freq: Four times a day (QID) | ORAL | 0 refills | Status: AC
Start: 2022-01-14 — End: 2022-01-28

## 2022-01-14 NOTE — Progress Notes (Signed)
?Thomas Bradley is a 76 y.o. male with the following history as recorded in EpicCare:  ?Patient Active Problem List  ? Diagnosis Date Noted  ? Atrial fibrillation ([place]) 12/30/2021  ? AVN of femur ([place]) 10/07/2019  ? PCP NOTES >>>>>>>>>>>>>>>>> 02/01/2016  ? Diabetes mellitus without complication ([place]) 16/07/9603  ? Rhinitis 08/30/2012  ? DJD (degenerative joint disease) 03/08/2012  ? Annual physical exam 01/28/2011  ? INGUINAL HERNIA, RIGHT 01/23/2009  ? GERD 09/20/2008  ? Dyslipidemia 03/08/2007  ? Essential hypertension 03/08/2007  ? BPH (benign prostatic hyperplasia) 03/08/2007  ?  ?Current Outpatient Medications  ?Medication Sig Dispense Refill  ? amLODipine (NORVASC) 10 MG tablet TAKE 1 TABLET(10 MG) BY MOUTH DAILY 90 tablet 1  ? apixaban (ELIQUIS) 5 MG TABS tablet Take 1 tablet (5 mg total) by mouth 2 (two) times daily. 60 tablet 3  ? Cholecalciferol (VITAMIN D3) 2000 UNITS TABS Take 1 tablet by mouth daily. Reported on 02/01/2016    ? Cyanocobalamin (B-12) 2500 MCG TABS Take 1 tablet by mouth daily. Reported on 02/01/2016    ? doxazosin (CARDURA) 4 MG tablet TAKE 1 TABLET(4 MG) BY MOUTH DAILY 90 tablet 1  ? pravastatin (PRAVACHOL) 40 MG tablet TAKE 1 TABLET(40 MG) BY MOUTH AT BEDTIME 90 tablet 1  ? vancomycin (VANCOCIN) 125 MG capsule Take 1 capsule (125 mg total) by mouth 4 (four) times daily for 14 days. 56 capsule 0  ? Krill Oil 500 MG CAPS Take 500 mg by mouth daily. (Patient not taking: Reported on 01/14/2022)    ? Multiple Vitamin (MULTIVITAMIN) capsule Take 1 capsule by mouth daily. Reported on 02/01/2016 (Patient not taking: Reported on 01/14/2022)    ? vitamin E 400 UNIT capsule Take 400 mg by mouth daily.  (Patient not taking: Reported on 01/14/2022)    ? ?No current facility-administered medications for this visit.  ?  ?Allergies: Patient has no known allergies.  ?Past Medical History:  ?Diagnosis Date  ? Atrial fibrillation ([place])   ? Benign prostatic hypertrophy   ? ED, sees urology every year  ? BPH  (benign prostatic hypertrophy) with urinary obstruction   ? Dr. Risa Grill  ? Elevated PSA   ? Dr. Risa Grill  ? Erectile dysfunction due to arterial insufficiency   ? Dr. Risa Grill  ? Erythema multiforme   ? dx 2013   ? GERD (gastroesophageal reflux disease)   ? Hyperlipemia   ? Hypertension   ? Nocturia   ? Prostatitis   ? 2010, again 09-2011  ?  ?Past Surgical History:  ?Procedure Laterality Date  ? HERNIA REPAIR  07-2010  ? open, R inguinal w/ mesh  ? TOTAL HIP ARTHROPLASTY Left 10/10/2019  ? Procedure: LEFT TOTAL HIP ARTHROPLASTY ANTERIOR APPROACH;  Surgeon: Frederik Pear, MD;  Location: [facility];  Service: Orthopedics;  Laterality: Left;  ?  ?Family History  ?Problem Relation Age of Onset  ? Coronary artery disease Father 80  ? Hypertension Sister   ? Diabetes Brother   ? Stroke Neg Hx   ? Colon cancer Neg Hx   ? Prostate cancer Neg Hx   ?  ?Social History  ? ?Tobacco Use  ? Smoking status: Former  ?  Types: Cigarettes  ?  Quit date: 11/13/1976  ?  Years since quitting: 45.2  ? Smokeless tobacco: Never  ?Substance Use Topics  ? Alcohol use: No  ?  ?Subjective:  ? ?Was treated for C. Diff at end of February- notes that he did feel good for a  few weeks; then developed A. Fib in March; ?Now complaining of 3-4 days history of recurrent episodes of diarrhea; denies any blood in the stool; does have some lower abdominal pain/ crampy sensation; no vomiting;  ? ? ? ?Objective:  ?Vitals:  ? 01/14/22 1336  ?BP: 100/60  ?Pulse: 85  ?Resp: 17  ?Temp: 98.6 ?F (37 ?C)  ?TempSrc: Oral  ?SpO2: 97%  ?Weight: 212 lb 9.6 oz (96.4 kg)  ?Height: _0  (1.854 m)  ?  ?General: Well developed, well nourished, in no acute distress  ?Skin : Warm and dry.  ?Head: Normocephalic and atraumatic  ?Eyes: Sclera and conjunctiva clear; pupils round and reactive to light; extraocular movements intact  ?Ears: External normal; canals clear; tympanic membranes normal  ?Oropharynx: Pink, supple. No suspicious lesions  ?Neck: Supple without thyromegaly, adenopathy   ?Lungs: Respirations unlabored; clear to auscultation bilaterally without wheeze, rales, rhonchi  ?Abdomen: Soft; nontender; nondistended; normoactive bowel sounds; no masses or hepatosplenomegaly  ?Neurologic: Alert and oriented; speech intact; face symmetrical; moves all extremities well; CNII-XII intact without focal deficit  ? ?Assessment:  ?1. Diarrhea, unspecified type   ?2. History of Clostridioides difficile infection   ?  ?Plan:  ?Concern for incomplete resolution of original C. Diff infection; will re-check stool culture today; will also check CBC, CMP today; will re-start Vancomycin after he drops off stool culture today; follow up to be determined- to consider CT and/or GI evaluation;  ? ?This visit occurred during the SARS-CoV-2 public health emergency.  Safety protocols were in place, including screening questions prior to the visit, additional usage of staff PPE, and extensive cleaning of exam room while observing appropriate contact time as indicated for disinfecting solutions.  ? ? ?No follow-ups on file.  ?Orders Placed This Encounter  ?Procedures  ? GI Profile, Stool, PCR  ? CBC with Differential/Platelet  ? Comp Met (CMET)  ?  ?Requested Prescriptions  ? ?Signed Prescriptions Disp Refills  ? vancomycin (VANCOCIN) 125 MG capsule 56 capsule 0  ?  Sig: Take 1 capsule (125 mg total) by mouth 4 (four) times daily for 14 days.  ?  ? ?

## 2022-01-15 ENCOUNTER — Other Ambulatory Visit: Payer: Medicare Other

## 2022-01-15 DIAGNOSIS — R197 Diarrhea, unspecified: Secondary | ICD-10-CM

## 2022-01-15 DIAGNOSIS — Z8619 Personal history of other infectious and parasitic diseases: Secondary | ICD-10-CM | POA: Diagnosis not present

## 2022-01-15 LAB — COMPREHENSIVE METABOLIC PANEL
ALT: 11 U/L (ref 0–53)
AST: 12 U/L (ref 0–37)
Albumin: 3.9 g/dL (ref 3.5–5.2)
Alkaline Phosphatase: 78 U/L (ref 39–117)
BUN: 24 mg/dL — ABNORMAL HIGH (ref 6–23)
CO2: 26 mEq/L (ref 19–32)
Calcium: 8.8 mg/dL (ref 8.4–10.5)
Chloride: 101 mEq/L (ref 96–112)
Creatinine, Ser: 1.32 mg/dL (ref 0.40–1.50)
GFR: 51.82 mL/min — ABNORMAL LOW (ref >60.00)
Glucose, Bld: 142 mg/dL — ABNORMAL HIGH (ref 70–99)
Potassium: 3.9 mEq/L (ref 3.5–5.1)
Sodium: 136 mEq/L (ref 135–145)
Total Bilirubin: 1 mg/dL (ref 0.2–1.2)
Total Protein: 5.9 g/dL — ABNORMAL LOW (ref 6.0–8.3)

## 2022-01-15 LAB — CBC WITH DIFFERENTIAL/PLATELET
Basophils Absolute: 0.1 10*3/uL (ref 0.0–0.1)
Basophils Relative: 0.7 % (ref 0.0–3.0)
Eosinophils Absolute: 0.1 10*3/uL (ref 0.0–0.7)
Eosinophils Relative: 0.6 % (ref 0.0–5.0)
HCT: 45 % (ref 39.0–52.0)
Hemoglobin: 14.9 g/dL (ref 13.0–17.0)
Lymphocytes Relative: 10.1 % — ABNORMAL LOW (ref 12.0–46.0)
Lymphs Abs: 1.2 10*3/uL (ref 0.7–4.0)
MCHC: 33.1 g/dL (ref 30.0–36.0)
MCV: 91.3 fl (ref 78.0–100.0)
Monocytes Absolute: 1.5 10*3/uL — ABNORMAL HIGH (ref 0.1–1.0)
Monocytes Relative: 13.1 % — ABNORMAL HIGH (ref 3.0–12.0)
Neutro Abs: 8.7 10*3/uL — ABNORMAL HIGH (ref 1.4–7.7)
Neutrophils Relative %: 75.5 % (ref 43.0–77.0)
Platelets: 154 10*3/uL (ref 150.0–400.0)
RBC: 4.93 Mil/uL (ref 4.22–5.81)
RDW: 14.7 % (ref 11.5–15.5)
WBC: 11.5 10*3/uL — ABNORMAL HIGH (ref 4.0–10.5)

## 2022-01-15 NOTE — Addendum Note (Signed)
Addended by: Kelle Darting A on: 01/15/2022 10:36 AM ? ? Modules accepted: Orders ? ?

## 2022-01-17 ENCOUNTER — Other Ambulatory Visit: Payer: Self-pay | Admitting: Family

## 2022-01-17 DIAGNOSIS — I4891 Unspecified atrial fibrillation: Secondary | ICD-10-CM | POA: Diagnosis not present

## 2022-01-17 DIAGNOSIS — A498 Other bacterial infections of unspecified site: Secondary | ICD-10-CM

## 2022-01-17 LAB — GI PROFILE, STOOL, PCR

## 2022-01-21 ENCOUNTER — Ambulatory Visit (INDEPENDENT_AMBULATORY_CARE_PROVIDER_SITE_OTHER): Payer: Medicare Other | Admitting: Internal Medicine

## 2022-01-21 ENCOUNTER — Other Ambulatory Visit: Payer: Self-pay

## 2022-01-21 ENCOUNTER — Encounter: Payer: Self-pay | Admitting: Internal Medicine

## 2022-01-21 ENCOUNTER — Telehealth: Payer: Self-pay | Admitting: Internal Medicine

## 2022-01-21 DIAGNOSIS — A0472 Enterocolitis due to Clostridium difficile, not specified as recurrent: Secondary | ICD-10-CM | POA: Diagnosis not present

## 2022-01-21 NOTE — Progress Notes (Signed)
?  [facility]  ? ? ? ? ?Reason for Consult:C diff infection    ?Referring Physician: Johnnette Litter, FNP ? ? ? Patient ID: Thomas Bradley, male    DOB: [birth year], 76 y.o.   MRN: [id number] ? ?HPI:   ?Here for evaluation of possible recurrent C diff.  ?He developed diarrhea in February and GI tests positive for C diff and treated with oral vancomycin.  Improved but 2 weeks ago again developed some mild diarrhea, about 4 loose stools a day and presented back to his PCPs office.  He was again started on oral vancomycin and has not had any loose stools since that time.  No fever.  Some weight loss of over 10 pounds.  No blood in stool.  Eating well now with a good appetite.  No recent antibiotic use.   ? ?Past Medical History:  ?Diagnosis Date  ? Atrial fibrillation ([place])   ? Benign prostatic hypertrophy   ? ED, sees urology every year  ? BPH (benign prostatic hypertrophy) with urinary obstruction   ? Dr. Risa Grill  ? Elevated PSA   ? Dr. Risa Grill  ? Erectile dysfunction due to arterial insufficiency   ? Dr. Risa Grill  ? Erythema multiforme   ? dx 2013   ? GERD (gastroesophageal reflux disease)   ? Hyperlipemia   ? Hypertension   ? Nocturia   ? Prostatitis   ? 2010, again 09-2011  ? ? ?Prior to Admission medications   ?Medication Sig Start Date End Date Taking? Authorizing Provider  ?amLODipine (NORVASC) 10 MG tablet TAKE 1 TABLET(10 MG) BY MOUTH DAILY 12/06/21   Colon Branch, MD  ?apixaban (ELIQUIS) 5 MG TABS tablet Take 1 tablet (5 mg total) by mouth 2 (two) times daily. 12/30/21   Colon Branch, MD  ?Cholecalciferol (VITAMIN D3) 2000 UNITS TABS Take 1 tablet by mouth daily. Reported on 02/01/2016    [provider]  ?Cyanocobalamin (B-12) 2500 MCG TABS Take 1 tablet by mouth daily. Reported on 02/01/2016    [provider]  ?doxazosin (CARDURA) 4 MG tablet TAKE 1 TABLET(4 MG) BY MOUTH DAILY 12/06/21   Colon Branch, MD  ?Javier Docker Oil 500 MG CAPS Take 500 mg by mouth daily. ?Patient not taking:  Reported on 01/14/2022    [provider]  ?Multiple Vitamin (MULTIVITAMIN) capsule Take 1 capsule by mouth daily. Reported on 02/01/2016 ?Patient not taking: Reported on 01/14/2022    [provider]  ?pravastatin (PRAVACHOL) 40 MG tablet TAKE 1 TABLET(40 MG) BY MOUTH AT BEDTIME 09/09/21   Colon Branch, MD  ?vancomycin (VANCOCIN) 125 MG capsule Take 1 capsule (125 mg total) by mouth 4 (four) times daily for 14 days. 01/14/22 01/28/22  Marrian Salvage, [place]  ?vitamin E 400 UNIT capsule Take 400 mg by mouth daily.  ?Patient not taking: Reported on 01/14/2022    [provider]  ? ? ?No Known Allergies ? ?Social History  ? ?Tobacco Use  ? Smoking status: Former  ?  Types: Cigarettes  ?  Quit date: 11/13/1976  ?  Years since quitting: 45.2  ? Smokeless tobacco: Never  ?Vaping Use  ? Vaping Use: Never used  ?Substance Use Topics  ? Alcohol use: No  ? Drug use: No  ? ? ?Family History  ?Problem Relation Age of Onset  ? Coronary artery disease Father 5  ? Hypertension Sister   ? Diabetes Brother   ? Stroke Neg Hx   ?  Colon cancer Neg Hx   ? Prostate cancer Neg Hx   ? ? ?Review of Systems ? Constitutional: negative for fevers ?Gastrointestinal: negative for melena ?All other systems reviewed and are negative   ? ?Constitutional: in no apparent distress There were no vitals filed for this visit. ?EYES: anicteric ?Respiratory: normal respiratory effort ?GI: soft, non-distended ?Musculoskeletal: no edema ?Skin: no rash ? ?Labs: ?Lab Results  ?Component Value Date  ? WBC 11.5 (H) 01/14/2022  ? HGB 14.9 01/14/2022  ? HCT 45.0 01/14/2022  ? MCV 91.3 01/14/2022  ? PLT 154.0 01/14/2022  ?  ?Lab Results  ?Component Value Date  ? CREATININE 1.32 01/14/2022  ? BUN 24 (H) 01/14/2022  ? NA 136 01/14/2022  ? K 3.9 01/14/2022  ? CL 101 01/14/2022  ? CO2 26 01/14/2022  ?  ?Lab Results  ?Component Value Date  ? ALT 11 01/14/2022  ? AST 12 01/14/2022  ? ALKPHOS 78 01/14/2022  ? BILITOT 1.0 01/14/2022  ? INR 1.2  10/07/2019  ?  ? ?Assessment: resolved C diff infection.  No new concerning symptoms.  I educated on recovery period and to anticipate intermittent episodes of diarrhea and return precautions including > 8-10 epidsodes and urgency with similar symptoms as he initially presented.   ?I also discussed that retesting is not indicated as the test will remain positive for up to 6 months or more after the initial episode.  ? ? ?Plan: ?1)  continue vancomycin ?2) call with new concerns as needed ?

## 2022-01-21 NOTE — Telephone Encounter (Signed)
Patient was not a scheduled to see cardiology, apparently when cardiology called he did not know who they were and he said he was not interested. ?I spoke with the patient, advised him to reach out to cardiology [phone]) and set up an appointment, he agreed. ?

## 2022-01-21 NOTE — Patient Instructions (Signed)
You may experience intermittent episodes of diarrhea for the next several weeks to months as part of the normal recovery period after C diff infection. ?Please call if you experience a significant increase in your stools, such as urgency or more than 8-10 episodes a day. ?

## 2022-01-27 ENCOUNTER — Ambulatory Visit (INDEPENDENT_AMBULATORY_CARE_PROVIDER_SITE_OTHER): Payer: Medicare Other | Admitting: Cardiology

## 2022-01-27 ENCOUNTER — Encounter: Payer: Self-pay | Admitting: Cardiology

## 2022-01-27 VITALS — BP 122/62 | HR 90 | Ht 74.0 in | Wt 213.0 lb

## 2022-01-27 DIAGNOSIS — E119 Type 2 diabetes mellitus without complications: Secondary | ICD-10-CM | POA: Diagnosis not present

## 2022-01-27 DIAGNOSIS — R5383 Other fatigue: Secondary | ICD-10-CM | POA: Diagnosis not present

## 2022-01-27 DIAGNOSIS — I4891 Unspecified atrial fibrillation: Secondary | ICD-10-CM | POA: Diagnosis not present

## 2022-01-27 DIAGNOSIS — R0609 Other forms of dyspnea: Secondary | ICD-10-CM | POA: Diagnosis not present

## 2022-01-27 DIAGNOSIS — E785 Hyperlipidemia, unspecified: Secondary | ICD-10-CM

## 2022-01-27 DIAGNOSIS — I10 Essential (primary) hypertension: Secondary | ICD-10-CM | POA: Diagnosis not present

## 2022-01-27 MED ORDER — METOPROLOL SUCCINATE ER 25 MG PO TB24: 25.0000 mg | ORAL_TABLET | Freq: Every day | ORAL | 3 refills | Status: DC

## 2022-01-27 NOTE — Patient Instructions (Signed)
Medication Instructions:  ? ?START TAKING METOPROLOL  SUCCINATE ( TOPROL XL)  25 MG  daily  ? ? ?*If you need a refill on your cardiac medications before your next appointment, please call your pharmacy* ? ? ?Lab Work: ? CBC, BMP  --  the week of April 17. 2023  -  ? ?If you have labs (blood work) drawn today and your tests are completely normal, you will receive your results only by: ?MyChart Message (if you have MyChart) OR ?A paper copy in the mail ?If you have any lab test that is abnormal or we need to change your treatment, we will call you to review the results. ? ? ?Testing/Procedures: ? ? WILL BE SCHEDULE AT [place] Your physician has requested that you have a lexiscan myoview. Please follow instruction sheet, as given.  ? ? ?AND  ?SCHEDULE AT [facility]  ?Your physician has recommended that you have a Cardioversion (DCCV). Electrical Cardioversion uses a jolt of electricity to your heart either through paddles or wired patches attached to your chest. This is a controlled, usually prescheduled, procedure. Defibrillation is done under light anesthesia in the hospital, and you usually go home the day of the procedure. This is done to get your heart back into a normal rhythm. You are not awake for the procedure. Please see the instruction sheet given to you today.  ? ?Follow-Up: ?At [facility], you and your health needs are our priority.  As part of our continuing mission to provide you with exceptional heart care, we have created designated Provider Care Teams.  These Care Teams include your primary Cardiologist (physician) and Advanced Practice Providers (APPs -  Physician Assistants and Nurse Practitioners) who all work together to provide you with the care you need, when you need it. ? ?  ? ?Your next appointment:   ?5 TO 6 week(s) ? ?The format for your next appointment:   ?In Person ? ?Provider:   ?Glenetta Hew, MD  ? ? ?Other Instructions  ? ?You are  scheduled for a Cardioversion on 10 AM  with Dr. Johney Frame .  Please arrive at the [facility] (Main Entrance A) at [facility]: [address] [place], [place] [address] at 9 am .  ? ?DIET: Nothing to eat or drink after midnight except a sip of water with medications (see medication instructions below) ? ?FYI: For your safety, and to allow [place] to monitor your vital signs accurately during the surgery/procedure we request that   ?if you have artificial nails, gel coating, SNS etc. Please have those removed prior to your surgery/procedure. Not having the nail coverings /polish removed may result in cancellation or delay of your surgery/procedure. ? ? ?Medication Instructions: ? ?Continue your anticoagulant:  ELIQUIS  ?You will need to continue your anticoagulant after your procedure until you  are told by your  ?Provider that it is safe to stop ? ? ?Labs: cbc,BMP ?Come to:  OFFICE THE WEEK OF February 10, 2022  ?between the hours of 8:00 am and 4:30 pm. You do not have to be fasting. ? ? ?You must have a responsible person to drive you home and stay in the waiting area during your procedure. Failure to do so could result in cancellation. ? ?Interior and spatial designer cards. ? ?*Special Note: Every effort is made to have your procedure done on time. Occasionally there are emergencies that occur at the hospital that may cause delays. Please  be patient if a delay does occur.   ?

## 2022-01-27 NOTE — Progress Notes (Signed)
? ? ?Primary Care Provider: Colon Branch, MD ?[facility] Cardiologist: Glenetta Hew, MD ?Electrophysiologist: None ? ?Clinic Note: ?Chief Complaint  ?Patient presents with  ? New Patient (Initial Visit)  ?  No symptoms  ? Atrial Fibrillation  ?  New diagnosis  ? ?=================================== ? ?ASSESSMENT/PLAN  ? ?Problem List Items Addressed This Visit   ? ?  ? Cardiology Problems  ? New onset atrial fibrillation (HCC) - Primary (Chronic)  ?  New diagnosis of A-fib in a 76 year old gentleman with family history of CAD who also has hypertension and hyperlipidemia. ? ?It is possible that his A-fib was triggered by his recent C. difficile infection, but also need to exclude CAD. ? ?Since he is relatively asymptomatic, we are not in a rush to get him out of atrial fibrillation, however I would like to see if we could potentially restore sinus rhythm. ? ?He started taking Eliquis about 2 weeks ago. ? ?This patients CHA2DS2-VASc Score and unadjusted Ischemic Stroke Rate (% per year) is equal to 3.2 % stroke rate/year from a score of 3-this is at a minimum as he quite likely has aortic atherosclerosis plaque at age 34 ? ?Above score calculated as 1 point each if present [CHF, HTN, DM, Vascular=MI/PAD/Aortic Plaque, Age if 16-74, or Male] ?Above score calculated as 2 points each if present [Age > 75, or Stroke/TIA/TE] ? ? ?Plan: ?Continue Eliquis 5 mg twice daily ?Start Toprol 25 mg daily (nightly) ?Ischemic evaluation with Myoview Stress Test -> although would like to do Coronary CTA, with it being persistently in A-fib, would not get adequate results. ?See informed decision-making below with consent. ?Schedule DCCV in 3 weeks which would mean at least 4-5 weeks of anticoagulation. ?Return in 1 week after DCCV to reassess EKG. ?  ?  ? Relevant Medications  ? losartan (COZAAR) 50 MG tablet  ? metoprolol succinate (TOPROL XL) 25 MG 24 hr tablet  ? Other Relevant Orders  ? EKG 12-Lead  ? Cardiac Stress Test:  Informed Consent Details: Physician/Practitioner Attestation; Transcribe to consent form and obtain patient signature  ? Basic metabolic panel  ? CBC  ? MYOCARDIAL PERFUSION IMAGING  ? Essential hypertension (Chronic)  ?  Blood pressure looks good on losartan which is appropriate as a diabetic.  He is also on amlodipine.  I am adding Toprol 25 mg daily.  We may need to back down on the amlodipine in order to use more Toprol if necessary. ?  ?  ? Relevant Medications  ? losartan (COZAAR) 50 MG tablet  ? metoprolol succinate (TOPROL XL) 25 MG 24 hr tablet  ? Other Relevant Orders  ? Cardiac Stress Test: Informed Consent Details: Physician/Practitioner Attestation; Transcribe to consent form and obtain patient signature  ?  ? Other  ? Fatigue  ?  I think he may develop fatigue because of his C. difficile, but it could also be A-fib. ? ?Planning ischemic evaluation with most likely Lexiscan Myoview. ?  ?  ? Relevant Orders  ? Cardiac Stress Test: Informed Consent Details: Physician/Practitioner Attestation; Transcribe to consent form and obtain patient signature  ? Basic metabolic panel  ? CBC  ? MYOCARDIAL PERFUSION IMAGING  ? Dyslipidemia (Chronic)  ?  On pravastatin.  Most recent labs showed LDL 97.  Pending results of stress test may need to be more aggressive. ?  ?  ? Relevant Orders  ? Cardiac Stress Test: Informed Consent Details: Physician/Practitioner Attestation; Transcribe to consent form and obtain patient signature  ?  Diabetes mellitus without complication (HCC) (Chronic)  ?  A1c 7.1.  Management PCP.  Not currently on medications. ?  ?  ? Relevant Medications  ? losartan (COZAAR) 50 MG tablet  ? Other Relevant Orders  ? Cardiac Stress Test: Informed Consent Details: Physician/Practitioner Attestation; Transcribe to consent form and obtain patient signature  ? ?Other Visit Diagnoses   ? ? DOE (dyspnea on exertion)      ? Relevant Orders  ? Cardiac Stress Test: Informed Consent Details:  Physician/Practitioner Attestation; Transcribe to consent form and obtain patient signature  ? Basic metabolic panel  ? CBC  ? MYOCARDIAL PERFUSION IMAGING  ? ?  ? ? ?=================================== ? ?HPI:   ? ?Thomas Bradley is a 76 y.o. male who is being seen today for the evaluation of [place] at the request of Colon Branch, MD. ? ?Thomas Bradley was recently seen by Dr. Larose Kells on December 30, 2021 for routine follow-up to discuss evaluation of baseline cardiovascular health.  [name] had read about Coronary Calcium Score evaluation, gaining interest because his brother had recently been diagnosed with advanced cardiac disease.  (His older brother is currently age 36 with years of severe diabetes CAD, PAD and likely advanced heart failure). ?=> 12/20/2021: He has had diarrhea with some nausea and fevers and chills.  He was tested positive for C. difficile and has been through at least 2 rounds of antibiotics including vancomycin which she is about to complete. ?=> Unexpectedly he was found to be in A-fib on exam confirmed by 2 EKG.  He was rate controlled and had no symptoms of irregular heartbeats. ?Echocardiogram and event monitor ordered-results reviewed below. ?He was referred to cardiology ? ?Recent Hospitalizations: None ? ?Reviewed  CV studies:   ? ?The following studies were reviewed today: (if available, images/films reviewed: From Epic Chart or Care Everywhere) ?7-day Zio patch monitor 12/2021:: 100% A-fib.  Heart rate range 55 to 179 bpm.  (Slow rate, controlled VR and RVR) 3 short runs of NSVT (fastest and longest: 9 beats at a rate to 40 bpm).  Occasional PVCs ? ?TTE 01/01/2022: Normal LV size and function.  EF 55 to 60%.  No RWMA.  With A-fib, unable to assess DF.  Normal atrial sizes.  Normal RV size and function.  Normal PAP and borderline elevated RAP.  (Estimated 8 mmHg) ? ?Interval History:  ? ?Thomas Bradley presents today for cardiology evaluation still in A-fib, but without  concerning symptoms A-fib. ?He denies any chest pain or pressure with rest or exertion.  No rapid heartbeats or palpitations.  No sense of an irregular heartbeat.  No exertional dyspnea although he has slowed up some over the last couple years, he does not think of any specific symptoms that he has.  About the only thing is been an issue is the diarrhea that he had last month.  Even that is clearing up. ? ?He has not had any lightheadedness dizziness, syncope or near syncope.  He is finally starting to feel more back to normal as far as his GI symptoms.  Diarrhea has slowed down.  No fevers or chills.  No more hematochezia. ? ?CV Review of Symptoms (Summary) ?Cardiovascular ROS: positive for - -maybe a little rundown having just gotten over C. difficile, but otherwise doing well. ?negative for - chest pain, dyspnea on exertion, edema, irregular heartbeat, orthopnea, palpitations, paroxysmal nocturnal dyspnea, rapid heart rate, shortness of breath, or lightheadedness, dizziness or wooziness, syncope/near syncope  or TIA/amaurosis fugax claudication ? ?REVIEWED OF SYSTEMS  ? ?Review of Systems  ?Constitutional:  Positive for chills and weight loss. Negative for fever (No more fevers) and malaise/fatigue (STEMI a little bit reduced energy with getting over his second round of C. difficile antibiotics.).  ?Respiratory:  Negative for cough, sputum production and shortness of breath.   ?Cardiovascular:   ?     Per HPI  ?Gastrointestinal:  Positive for abdominal pain, blood in stool, diarrhea and nausea.  ?     Clearing up symptoms of nausea, diarrhea with hematochezia.  Has not had any of the symptoms in the last couple weeks.  ?Musculoskeletal:  Positive for joint pain. Negative for falls.  ?Neurological:  Positive for weakness (Regaining strength after C. difficile). Negative for dizziness and focal weakness.  ?Psychiatric/Behavioral:  Negative for depression and memory loss. The patient is not nervous/anxious and does  not have insomnia.   ? ?I have reviewed and (if needed) personally updated the patient's problem list, medications, allergies, past medical and surgical history, social and family history.  ? ?PAST MEDICAL HISTORY  ? ?Past Medic

## 2022-01-27 NOTE — H&P (View-Only) (Signed)
? ? ?Primary Care Provider: Colon Branch, MD ?[facility] Cardiologist: Thomas Hew, MD ?Electrophysiologist: None ? ?Clinic Note: ?Chief Complaint  ?Patient presents with  ? New Patient (Initial Visit)  ?  No symptoms  ? Atrial Fibrillation  ?  New diagnosis  ? ?=================================== ? ?ASSESSMENT/PLAN  ? ?Problem List Items Addressed This Visit   ? ?  ? Cardiology Problems  ? New onset atrial fibrillation (HCC) - Primary (Chronic)  ?  New diagnosis of A-fib in a 76 year old gentleman with family history of CAD who also has hypertension and hyperlipidemia. ? ?It is possible that his A-fib was triggered by his recent C. difficile infection, but also need to exclude CAD. ? ?Since he is relatively asymptomatic, we are not in a rush to get him out of atrial fibrillation, however I would like to see if we could potentially restore sinus rhythm. ? ?He started taking Eliquis about 2 weeks ago. ? ?This patients CHA2DS2-VASc Score and unadjusted Ischemic Stroke Rate (% per year) is equal to 3.2 % stroke rate/year from a score of 3-this is at a minimum as he quite likely has aortic atherosclerosis plaque at age 71 ? ?Above score calculated as 1 point each if present [CHF, HTN, DM, Vascular=MI/PAD/Aortic Plaque, Age if 39-74, or Male] ?Above score calculated as 2 points each if present [Age > 75, or Stroke/TIA/TE] ? ? ?Plan: ?Continue Eliquis 5 mg twice daily ?Start Toprol 25 mg daily (nightly) ?Ischemic evaluation with Myoview Stress Test -> although would like to do Coronary CTA, with it being persistently in A-fib, would not get adequate results. ?See informed decision-making below with consent. ?Schedule DCCV in 3 weeks which would mean at least 4-5 weeks of anticoagulation. ?Return in 1 week after DCCV to reassess EKG. ?  ?  ? Relevant Medications  ? losartan (COZAAR) 50 MG tablet  ? metoprolol succinate (TOPROL XL) 25 MG 24 hr tablet  ? Other Relevant Orders  ? EKG 12-Lead  ? Cardiac Stress Test:  Informed Consent Details: Physician/Practitioner Attestation; Transcribe to consent form and obtain patient signature  ? Basic metabolic panel  ? CBC  ? MYOCARDIAL PERFUSION IMAGING  ? Essential hypertension (Chronic)  ?  Blood pressure looks good on losartan which is appropriate as a diabetic.  He is also on amlodipine.  I am adding Toprol 25 mg daily.  We may need to back down on the amlodipine in order to use more Toprol if necessary. ?  ?  ? Relevant Medications  ? losartan (COZAAR) 50 MG tablet  ? metoprolol succinate (TOPROL XL) 25 MG 24 hr tablet  ? Other Relevant Orders  ? Cardiac Stress Test: Informed Consent Details: Physician/Practitioner Attestation; Transcribe to consent form and obtain patient signature  ?  ? Other  ? Fatigue  ?  I think he may develop fatigue because of his C. difficile, but it could also be A-fib. ? ?Planning ischemic evaluation with most likely Lexiscan Myoview. ?  ?  ? Relevant Orders  ? Cardiac Stress Test: Informed Consent Details: Physician/Practitioner Attestation; Transcribe to consent form and obtain patient signature  ? Basic metabolic panel  ? CBC  ? MYOCARDIAL PERFUSION IMAGING  ? Dyslipidemia (Chronic)  ?  On pravastatin.  Most recent labs showed LDL 97.  Pending results of stress test may need to be more aggressive. ?  ?  ? Relevant Orders  ? Cardiac Stress Test: Informed Consent Details: Physician/Practitioner Attestation; Transcribe to consent form and obtain patient signature  ?  Diabetes mellitus without complication (HCC) (Chronic)  ?  A1c 7.1.  Management PCP.  Not currently on medications. ?  ?  ? Relevant Medications  ? losartan (COZAAR) 50 MG tablet  ? Other Relevant Orders  ? Cardiac Stress Test: Informed Consent Details: Physician/Practitioner Attestation; Transcribe to consent form and obtain patient signature  ? ?Other Visit Diagnoses   ? ? DOE (dyspnea on exertion)      ? Relevant Orders  ? Cardiac Stress Test: Informed Consent Details:  Physician/Practitioner Attestation; Transcribe to consent form and obtain patient signature  ? Basic metabolic panel  ? CBC  ? MYOCARDIAL PERFUSION IMAGING  ? ?  ? ? ?=================================== ? ?HPI:   ? ?Thomas Bradley is a 76 y.o. male who is being seen today for the evaluation of Thomas Bradley at the request of Thomas Branch, MD. ? ?Thomas Bradley was recently seen by Dr. Larose Bradley on December 30, 2021 for routine follow-up to discuss evaluation of baseline cardiovascular health.  Thomas Bradley had read about Coronary Calcium Score evaluation, gaining interest because his brother had recently been diagnosed with advanced cardiac disease.  (His older brother is currently age 90 with years of severe diabetes CAD, PAD and likely advanced heart failure). ?=> 12/20/2021: He has had diarrhea with some nausea and fevers and chills.  He was tested positive for C. difficile and has been through at least 2 rounds of antibiotics including vancomycin which she is about to complete. ?=> Unexpectedly he was found to be in A-fib on exam confirmed by 2 EKG.  He was rate controlled and had no symptoms of irregular heartbeats. ?Echocardiogram and event monitor ordered-results reviewed below. ?He was referred to cardiology ? ?Recent Hospitalizations: None ? ?Reviewed  CV studies:   ? ?The following studies were reviewed today: (if available, images/films reviewed: From Epic Chart or Care Everywhere) ?7-day Zio patch monitor 12/2021:: 100% A-fib.  Heart rate range 55 to 179 bpm.  (Slow rate, controlled VR and RVR) 3 short runs of NSVT (fastest and longest: 9 beats at a rate to 40 bpm).  Occasional PVCs ? ?TTE 01/01/2022: Normal LV size and function.  EF 55 to 60%.  No RWMA.  With A-fib, unable to assess DF.  Normal atrial sizes.  Normal RV size and function.  Normal PAP and borderline elevated RAP.  (Estimated 8 mmHg) ? ?Interval History:  ? ?Thomas Bradley presents today for cardiology evaluation still in A-fib, but without  concerning symptoms A-fib. ?He denies any chest pain or pressure with rest or exertion.  No rapid heartbeats or palpitations.  No sense of an irregular heartbeat.  No exertional dyspnea although he has slowed up some over the last couple years, he does not think of any specific symptoms that he has.  About the only thing is been an issue is the diarrhea that he had last month.  Even that is clearing up. ? ?He has not had any lightheadedness dizziness, syncope or near syncope.  He is finally starting to feel more back to normal as far as his GI symptoms.  Diarrhea has slowed down.  No fevers or chills.  No more hematochezia. ? ?CV Review of Symptoms (Summary) ?Cardiovascular ROS: positive for - -maybe a little rundown having just gotten over C. difficile, but otherwise doing well. ?negative for - chest pain, dyspnea on exertion, edema, irregular heartbeat, orthopnea, palpitations, paroxysmal nocturnal dyspnea, rapid heart rate, shortness of breath, or lightheadedness, dizziness or wooziness, syncope/near syncope  or TIA/amaurosis fugax claudication ? ?REVIEWED OF SYSTEMS  ? ?Review of Systems  ?Constitutional:  Positive for chills and weight loss. Negative for fever (No more fevers) and malaise/fatigue (STEMI a little bit reduced energy with getting over his second round of C. difficile antibiotics.).  ?Respiratory:  Negative for cough, sputum production and shortness of breath.   ?Cardiovascular:   ?     Per HPI  ?Gastrointestinal:  Positive for abdominal pain, blood in stool, diarrhea and nausea.  ?     Clearing up symptoms of nausea, diarrhea with hematochezia.  Has not had any of the symptoms in the last couple weeks.  ?Musculoskeletal:  Positive for joint pain. Negative for falls.  ?Neurological:  Positive for weakness (Regaining strength after C. difficile). Negative for dizziness and focal weakness.  ?Psychiatric/Behavioral:  Negative for depression and memory loss. The patient is not nervous/anxious and does  not have insomnia.   ? ?I have reviewed and (if needed) personally updated the patient's problem list, medications, allergies, past medical and surgical history, social and family history.  ? ?PAST MEDICAL HISTORY  ? ?Past Medic

## 2022-01-28 ENCOUNTER — Telehealth (HOSPITAL_COMMUNITY): Payer: Self-pay | Admitting: *Deleted

## 2022-01-28 ENCOUNTER — Encounter: Payer: Self-pay | Admitting: Cardiology

## 2022-01-28 NOTE — Assessment & Plan Note (Signed)
On pravastatin.  Most recent labs showed LDL 97.  Pending results of stress test may need to be more aggressive. ?

## 2022-01-28 NOTE — Assessment & Plan Note (Signed)
Blood pressure looks good on losartan which is appropriate as a diabetic.  He is also on amlodipine.  I am adding Toprol 25 mg daily.  We may need to back down on the amlodipine in order to use more Toprol if necessary. ?

## 2022-01-28 NOTE — Telephone Encounter (Signed)
Left message on voicemail per DPR in reference to upcoming appointment scheduled on  01/30/22 with detailed instructions given per Myocardial Perfusion Study Information Sheet for the test. LM to arrive 15 minutes early, and that it is imperative to arrive on time for appointment to keep from having the test rescheduled. If you need to cancel or reschedule your appointment, please call the office within 24 hours of your appointment. Failure to do so may result in a cancellation of your appointment, and a $50 no show fee. Phone number given for call back for any questions. Kirstie Peri ? ? ?

## 2022-01-28 NOTE — Assessment & Plan Note (Signed)
New diagnosis of A-fib in a 76 year old gentleman with family history of CAD who also has hypertension and hyperlipidemia. ? ?It is possible that his A-fib was triggered by his recent C. difficile infection, but also need to exclude CAD. ? ?Since he is relatively asymptomatic, we are not in a rush to get him out of atrial fibrillation, however I would like to see if we could potentially restore sinus rhythm. ? ?He started taking Eliquis about 2 weeks ago. ? ?This patients CHA2DS2-VASc Score and unadjusted Ischemic Stroke Rate (% per year) is equal to 3.2 % stroke rate/year from a score of 3-this is at a minimum as he quite likely has aortic atherosclerosis plaque at age 4 ? ?Above score calculated as 1 point each if present [CHF, HTN, DM, Vascular=MI/PAD/Aortic Plaque, Age if 4-74, or Male] ?Above score calculated as 2 points each if present [Age > 75, or Stroke/TIA/TE] ? ? ?Plan: ?? Continue Eliquis 5 mg twice daily ?? Start Toprol 25 mg daily (nightly) ?? Ischemic evaluation with Myoview Stress Test -> although would like to do Coronary CTA, with it being persistently in A-fib, would not get adequate results. ?? See informed decision-making below with consent. ?? Schedule DCCV in 3 weeks which would mean at least 4-5 weeks of anticoagulation. ?? Return in 1 week after DCCV to reassess EKG. ?

## 2022-01-28 NOTE — Assessment & Plan Note (Signed)
I think he may develop fatigue because of his C. difficile, but it could also be A-fib. ? ?Planning ischemic evaluation with most likely Lexiscan Myoview. ?

## 2022-01-28 NOTE — Assessment & Plan Note (Signed)
A1c 7.1.  Management PCP.  Not currently on medications. ?

## 2022-01-30 ENCOUNTER — Ambulatory Visit (HOSPITAL_COMMUNITY): Payer: Medicare Other | Attending: Cardiovascular Disease

## 2022-01-30 DIAGNOSIS — R5383 Other fatigue: Secondary | ICD-10-CM | POA: Insufficient documentation

## 2022-01-30 DIAGNOSIS — R0609 Other forms of dyspnea: Secondary | ICD-10-CM | POA: Insufficient documentation

## 2022-01-30 DIAGNOSIS — I4891 Unspecified atrial fibrillation: Secondary | ICD-10-CM | POA: Insufficient documentation

## 2022-01-30 HISTORY — PX: NM MYOVIEW LTD: HXRAD82

## 2022-01-30 LAB — MYOCARDIAL PERFUSION IMAGING
LV dias vol: 87 mL (ref 62–150)
LV sys vol: 36 mL
Nuc Stress EF: 58 %
Peak HR: 122 {beats}/min
Rest HR: 85 {beats}/min
Rest Nuclear Isotope Dose: 10.4 mCi
SDS: 0
SRS: 0
SSS: 0
ST Depression (mm): 0 mm
Stress Nuclear Isotope Dose: 32.8 mCi
TID: 1.03

## 2022-01-30 MED ORDER — TECHNETIUM TC 99M TETROFOSMIN IV KIT
32.8000 | PACK | Freq: Once | INTRAVENOUS | Status: AC | PRN
  Administered 2022-01-30: 32.8 via INTRAVENOUS
  Filled 2022-01-30: qty 33

## 2022-01-30 MED ORDER — REGADENOSON 0.4 MG/5ML IV SOLN
0.4000 mg | Freq: Once | INTRAVENOUS | Status: AC
  Administered 2022-01-30: 0.4 mg via INTRAVENOUS

## 2022-01-30 MED ORDER — TECHNETIUM TC 99M TETROFOSMIN IV KIT
10.4000 | PACK | Freq: Once | INTRAVENOUS | Status: AC | PRN
  Administered 2022-01-30: 10.4 via INTRAVENOUS
  Filled 2022-01-30: qty 11

## 2022-02-10 ENCOUNTER — Encounter (HOSPITAL_COMMUNITY): Payer: Self-pay | Admitting: Cardiology

## 2022-02-10 DIAGNOSIS — I4891 Unspecified atrial fibrillation: Secondary | ICD-10-CM | POA: Diagnosis not present

## 2022-02-10 DIAGNOSIS — R5383 Other fatigue: Secondary | ICD-10-CM | POA: Diagnosis not present

## 2022-02-10 DIAGNOSIS — R0609 Other forms of dyspnea: Secondary | ICD-10-CM | POA: Diagnosis not present

## 2022-02-10 NOTE — Progress Notes (Signed)
Attempted to obtain medical history via telephone, unable to reach at this time. I left a voicemail to return pre surgical testing department's phone call.  

## 2022-02-11 LAB — BASIC METABOLIC PANEL
BUN/Creatinine Ratio: 17 (ref 10–24)
BUN: 17 mg/dL (ref 8–27)
CO2: 22 mmol/L (ref 20–29)
Calcium: 9.7 mg/dL (ref 8.6–10.2)
Chloride: 105 mmol/L (ref 96–106)
Creatinine, Ser: 1.03 mg/dL (ref 0.76–1.27)
Glucose: 160 mg/dL — ABNORMAL HIGH (ref 70–99)
Potassium: 4.6 mmol/L (ref 3.5–5.2)
Sodium: 142 mmol/L (ref 134–144)
eGFR: 74 mL/min/{1.73_m2} (ref >59)

## 2022-02-11 LAB — CBC
Hematocrit: 42.4 % (ref 37.5–51.0)
Hemoglobin: 14 g/dL (ref 13.0–17.7)
MCH: 30.2 pg (ref 26.6–33.0)
MCHC: 33 g/dL (ref 31.5–35.7)
MCV: 92 fL (ref 79–97)
Platelets: 150 10*3/uL (ref 150–450)
RBC: 4.63 x10E6/uL (ref 4.14–5.80)
RDW: 13.8 % (ref 11.6–15.4)
WBC: 5 10*3/uL (ref 3.4–10.8)

## 2022-02-17 ENCOUNTER — Ambulatory Visit (HOSPITAL_COMMUNITY)
Admission: RE | Admit: 2022-02-17 | Discharge: 2022-02-17 | Disposition: A | Discharge status: Home / Self Care | Payer: Medicare Other | Attending: Cardiology | Admitting: Cardiology

## 2022-02-17 ENCOUNTER — Encounter (HOSPITAL_COMMUNITY): Payer: Self-pay | Admitting: Cardiology

## 2022-02-17 ENCOUNTER — Other Ambulatory Visit: Payer: Self-pay

## 2022-02-17 ENCOUNTER — Ambulatory Visit (HOSPITAL_COMMUNITY): Payer: Medicare Other | Admitting: Certified Registered Nurse Anesthetist

## 2022-02-17 ENCOUNTER — Ambulatory Visit (HOSPITAL_BASED_OUTPATIENT_CLINIC_OR_DEPARTMENT_OTHER): Payer: Medicare Other | Admitting: Certified Registered Nurse Anesthetist

## 2022-02-17 ENCOUNTER — Encounter (HOSPITAL_COMMUNITY)
Admission: RE | Disposition: A | Discharge status: Home / Self Care | Payer: Self-pay | Source: Home / Self Care | Attending: Cardiology

## 2022-02-17 DIAGNOSIS — Z79899 Other long term (current) drug therapy: Secondary | ICD-10-CM | POA: Diagnosis not present

## 2022-02-17 DIAGNOSIS — I4819 Other persistent atrial fibrillation: Secondary | ICD-10-CM

## 2022-02-17 DIAGNOSIS — R5383 Other fatigue: Secondary | ICD-10-CM | POA: Insufficient documentation

## 2022-02-17 DIAGNOSIS — I4891 Unspecified atrial fibrillation: Secondary | ICD-10-CM

## 2022-02-17 DIAGNOSIS — I10 Essential (primary) hypertension: Secondary | ICD-10-CM

## 2022-02-17 DIAGNOSIS — E119 Type 2 diabetes mellitus without complications: Secondary | ICD-10-CM | POA: Insufficient documentation

## 2022-02-17 DIAGNOSIS — Z87891 Personal history of nicotine dependence: Secondary | ICD-10-CM | POA: Insufficient documentation

## 2022-02-17 DIAGNOSIS — Z8249 Family history of ischemic heart disease and other diseases of the circulatory system: Secondary | ICD-10-CM | POA: Insufficient documentation

## 2022-02-17 DIAGNOSIS — R0609 Other forms of dyspnea: Secondary | ICD-10-CM | POA: Insufficient documentation

## 2022-02-17 DIAGNOSIS — E785 Hyperlipidemia, unspecified: Secondary | ICD-10-CM | POA: Insufficient documentation

## 2022-02-17 DIAGNOSIS — M1990 Unspecified osteoarthritis, unspecified site: Secondary | ICD-10-CM | POA: Diagnosis not present

## 2022-02-17 HISTORY — PX: CARDIOVERSION: SHX1299

## 2022-02-17 SURGERY — CARDIOVERSION
Anesthesia: General

## 2022-02-17 MED ORDER — SODIUM CHLORIDE 0.9 % IV SOLN
INTRAVENOUS | Status: AC | PRN
  Administered 2022-02-17: 500 mL via INTRAMUSCULAR

## 2022-02-17 MED ORDER — SODIUM CHLORIDE 0.9 % IV SOLN: INTRAVENOUS | Status: DC | PRN

## 2022-02-17 MED ORDER — LIDOCAINE 2% (20 MG/ML) 5 ML SYRINGE
INTRAMUSCULAR | Status: DC | PRN
  Administered 2022-02-17: 100 mg via INTRAVENOUS

## 2022-02-17 MED ORDER — PROPOFOL 10 MG/ML IV BOLUS
INTRAVENOUS | Status: DC | PRN
  Administered 2022-02-17: 100 mg via INTRAVENOUS

## 2022-02-17 NOTE — Anesthesia Procedure Notes (Signed)
Procedure Name: General with mask airway ?Date/Time: 02/17/2022 9:47 AM ?Performed by: Reece Agar, CRNA ?Pre-anesthesia Checklist: Patient identified, Emergency Drugs available, Suction available, Patient being monitored and Timeout performed ?Patient Re-evaluated:Patient Re-evaluated prior to induction ?Oxygen Delivery Method: Ambu bag ?Preoxygenation: Pre-oxygenation with 100% oxygen ?Induction Type: IV induction ? ? ? ? ?

## 2022-02-17 NOTE — Anesthesia Preprocedure Evaluation (Signed)
Anesthesia Evaluation  ?Patient identified by MRN, date of birth, ID band ?Patient awake ? ? ? ?Reviewed: ?Allergy & Precautions, NPO status , Patient's Chart, lab work & pertinent test results ? ?Airway ?Mallampati: II ? ?TM Distance: >3 FB ?Neck ROM: Full ? ? ? Dental ?no notable dental hx. ? ?  ?Pulmonary ?neg pulmonary ROS, former smoker,  ?  ?Pulmonary exam normal ?breath sounds clear to auscultation ? ? ? ? ? ? Cardiovascular ?hypertension, Pt. on medications ?Normal cardiovascular exam+ dysrhythmias Atrial Fibrillation  ?Rhythm:Regular Rate:Normal ? ? ?  ?Neuro/Psych ?negative neurological ROS ? negative psych ROS  ? GI/Hepatic ?Neg liver ROS, GERD  ,  ?Endo/Other  ?diabetes ? Renal/GU ?negative Renal ROS  ?negative genitourinary ?  ?Musculoskeletal ? ?(+) Arthritis , Osteoarthritis,   ? Abdominal ?  ?Peds ?negative pediatric ROS ?(+)  Hematology ?negative hematology ROS ?(+)   ?Anesthesia Other Findings ? ? Reproductive/Obstetrics ?negative OB ROS ? ?  ? ? ? ? ? ? ? ? ? ? ? ? ? ?  ?  ? ? ? ? ? ? ? ? ?Anesthesia Physical ?Anesthesia Plan ? ?ASA: 3 ? ?Anesthesia Plan: General  ? ?Post-op Pain Management: Minimal or no pain anticipated  ? ?Induction: Intravenous ? ?PONV Risk Score and Plan: 2 and Ondansetron, Midazolam and Treatment may vary due to age or medical condition ? ?Airway Management Planned: Mask ? ?Additional Equipment:  ? ?Intra-op Plan:  ? ?Post-operative Plan:  ? ?Informed Consent: I have reviewed the patients History and Physical, chart, labs and discussed the procedure including the risks, benefits and alternatives for the proposed anesthesia with the patient or authorized representative who has indicated his/her understanding and acceptance.  ? ? ? ?Dental advisory given ? ?Plan Discussed with: CRNA ? ?Anesthesia Plan Comments:   ? ? ? ? ? ? ?Anesthesia Quick Evaluation ? ?

## 2022-02-17 NOTE — Discharge Instructions (Signed)

## 2022-02-17 NOTE — Procedures (Signed)
Procedure: Electrical Cardioversion ?Indications:  Atrial Fibrillation ? ?Procedure Details: ? ?Consent: Risks of procedure as well as the alternatives and risks of each were explained to the (patient/caregiver).  Consent for procedure obtained. ? ?Time Out: Verified patient identification, verified procedure, site/side was marked, verified correct patient position, special equipment/implants available, medications/allergies/relevent history reviewed, required imaging and test results available. PERFORMED. ? ?Patient placed on cardiac monitor, pulse oximetry, supplemental oxygen as necessary.  ?Sedation given:  propofol '100mg'$ ; lidocaine '100mg'$  ?Pacer pads placed anterior and posterior chest. ? ?Cardioverted 3 time(s).  ?Cardioversion with synchronized biphasic 200J shock. ? ?Evaluation: ?Findings: Post procedure EKG shows: NSR with PACs and a PVC ?Complications: None ?Patient did tolerate procedure well. ? ?Time Spent Directly with the Patient: ? ?35 minutes  ? ?[name] ?02/17/2022, 9:52 AM  ?

## 2022-02-17 NOTE — Transfer of Care (Signed)
Immediate Anesthesia Transfer of Care Note ? ?Patient: Thomas Bradley ? ?Procedure(s) Performed: CARDIOVERSION ? ?Patient Location: PACU ? ?Anesthesia Type:General ? ?Level of Consciousness: drowsy ? ?Airway & Oxygen Therapy: Patient Spontanous Breathing ? ?Post-op Assessment: Report given to RN and Post -op Vital signs reviewed and stable ? ?Post vital signs: Reviewed and stable ? ?Last Vitals:  ?Vitals Value Taken Time  ?BP 100/65 02/17/22 0956  ?Temp    ?Pulse 63 02/17/22 0956  ?Resp 19 02/17/22 0956  ?SpO2 92 % 02/17/22 0956  ? ? ?Last Pain:  ?Vitals:  ? 02/17/22 0915  ?TempSrc: Temporal  ?PainSc: 0-No pain  ?   ? ?  ? ?Complications: No notable events documented. ?

## 2022-02-17 NOTE — Interval H&P Note (Signed)
History and Physical Interval Note: ? ?02/17/2022 ?9:06 AM ? ?Thomas Bradley  has presented today for surgery, with the diagnosis of AFIB.  The various methods of treatment have been discussed with the patient and family. After consideration of risks, benefits and other options for treatment, the patient has consented to  Procedure(s): ?CARDIOVERSION (N/A) as a surgical intervention.  The patient's history has been reviewed, patient examined, no change in status, stable for surgery.  I have reviewed the patient's chart and labs.  Questions were answered to the patient's satisfaction.   ? ? ?[name] ? ? ?

## 2022-02-17 NOTE — Anesthesia Postprocedure Evaluation (Signed)
Anesthesia Post Note ? ?Patient: Thomas Bradley ? ?Procedure(s) Performed: CARDIOVERSION ? ?  ? ?Patient location during evaluation: PACU ?Anesthesia Type: General ?Level of consciousness: awake and alert ?Pain management: pain level controlled ?Vital Signs Assessment: post-procedure vital signs reviewed and stable ?Respiratory status: spontaneous breathing, nonlabored ventilation and respiratory function stable ?Cardiovascular status: blood pressure returned to baseline and stable ?Postop Assessment: no apparent nausea or vomiting ?Anesthetic complications: no ? ? ?No notable events documented. ? ?Last Vitals:  ?Vitals:  ? 02/17/22 1005 02/17/22 1015  ?BP: 98/65 101/69  ?Pulse: (!) 58 (!) 53  ?Resp: 12 11  ?Temp:    ?SpO2: 98% 98%  ?  ?Last Pain:  ?Vitals:  ? 02/17/22 1015  ?TempSrc:   ?PainSc: 0-No pain  ? ? ?  ?  ?  ?  ?  ?  ? ?[name] ? ? ? ? ?

## 2022-02-19 ENCOUNTER — Encounter (HOSPITAL_COMMUNITY): Payer: Self-pay | Admitting: Cardiology

## 2022-02-24 ENCOUNTER — Ambulatory Visit (INDEPENDENT_AMBULATORY_CARE_PROVIDER_SITE_OTHER): Payer: Medicare Other

## 2022-02-24 VITALS — Ht 73.0 in | Wt 215.0 lb

## 2022-02-24 DIAGNOSIS — Z0000 Encounter for general adult medical examination without abnormal findings: Secondary | ICD-10-CM

## 2022-02-24 NOTE — Progress Notes (Addendum)
? ?Subjective:  ? QUENTION Bradley is a 76 y.o. male who presents for Medicare Annual/Subsequent preventive examination. ? ?I connected with Thomas Bradley today by telephone and verified that I am speaking with the correct person using two identifiers. ?Location patient: home ?Location provider: work ?Persons participating in the virtual visit: patient, nurse.  ?  ?I discussed the limitations, risks, security and privacy concerns of performing an evaluation and management service by telephone and the availability of in person appointments. I also discussed with the patient that there may be a patient responsible charge related to this service. The patient expressed understanding and verbally consented to this telephonic visit.  ?  ?Interactive audio and video telecommunications were attempted between this provider and patient, however failed, due to patient having technical difficulties OR patient did not have access to video capability.  We continued and completed visit with audio only. ? ?Some vital signs may be absent or patient reported.  ? ?Time Spent with patient on telephone encounter: 20 minutes ? ? ?Review of Systems    ? ?Cardiac Risk Factors include: advanced age (>70mn, >>36women);male gender;hypertension;dyslipidemia ? ?   ?Objective:  ?  ?Today's Vitals  ? 02/24/22 1101  ?Weight: 215 lb (97.5 kg)  ?Height: '6\' 1"'$  (1.854 m)  ? ?Body mass index is 28.37 kg/m?. ? ? ?  02/24/2022  ? 11:06 AM 02/17/2022  ?  9:12 AM 01/02/2020  ?  9:33 AM 10/07/2019  ? 11:40 AM 12/28/2018  ?  9:16 AM 11/09/2017  ?  8:45 AM  ?Advanced Directives  ?Does Patient Have a Medical Advance Directive? No No No No No No  ?Would patient like information on creating a medical advance directive?  No - Patient declined No - Patient declined No - Patient declined No - Patient declined No - Patient declined  ? ? ?Current Medications (verified) ?Outpatient Encounter Medications as of 02/24/2022  ?Medication Sig  ? amLODipine (NORVASC) 10 MG tablet TAKE 1  TABLET(10 MG) BY MOUTH DAILY  ? apixaban (ELIQUIS) 5 MG TABS tablet Take 1 tablet (5 mg total) by mouth 2 (two) times daily.  ? Cholecalciferol (VITAMIN D3) 2000 UNITS TABS Take 1 tablet by mouth daily. Reported on 02/01/2016  ? Cyanocobalamin (B-12) 2500 MCG TABS Take 1 tablet by mouth daily. Reported on 02/01/2016  ? doxazosin (CARDURA) 4 MG tablet TAKE 1 TABLET(4 MG) BY MOUTH DAILY  ? metoprolol succinate (TOPROL XL) 25 MG 24 hr tablet Take 1 tablet (25 mg total) by mouth daily.  ? Multiple Vitamin (MULTIVITAMIN) capsule Take 1 capsule by mouth daily. Reported on 02/01/2016  ? pravastatin (PRAVACHOL) 40 MG tablet TAKE 1 TABLET(40 MG) BY MOUTH AT BEDTIME  ? vitamin E 400 UNIT capsule Take 400 mg by mouth daily.  ? ?No facility-administered encounter medications on file as of 02/24/2022.  ? ? ?Allergies (verified) ?Patient has no known allergies.  ? ?History: ?Past Medical History:  ?Diagnosis Date  ? Atrial fibrillation ([facility] [place] 12/30/2021  ? 7-day Zio patch monitor: 100% A-fib  ? Benign prostatic hypertrophy   ? ED, sees urology every year  ? BPH (benign prostatic hypertrophy) with urinary obstruction   ? Dr. GRisa Grill ? Elevated PSA   ? Dr. GRisa Grill ? Erectile dysfunction due to arterial insufficiency   ? Dr. GRisa Grill ? Erythema multiforme   ? dx 2013   ? GERD (gastroesophageal reflux disease)   ? Hyperlipemia   ? Hypertension   ? Nocturia   ? Prostatitis   ?  2010, again 09-2011  ? ?Past Surgical History:  ?Procedure Laterality Date  ? 7-day Zio patch  12/2021  ? 100% A-fib.  Heart rate range 55 to 179 bpm.  (Slow rate, controlled VR and RVR) 3 short runs of NSVT (fastest and longest: 9 beats at a rate to 40 bpm).  Occasional PVCs  ? CARDIOVERSION N/A 02/17/2022  ? Procedure: CARDIOVERSION;  Surgeon: [name], MD;  Location: [facility] ENDOSCOPY;  Service: Cardiovascular;  Laterality: N/A;  ? HERNIA REPAIR  07/27/2010  ? open, R inguinal w/ mesh  ? TOTAL HIP ARTHROPLASTY Left 10/10/2019  ? Procedure: LEFT TOTAL HIP  ARTHROPLASTY ANTERIOR APPROACH;  Surgeon: Frederik Pear, MD;  Location: [facility] ORS;  Service: Orthopedics;  Laterality: Left;  ? TRANSTHORACIC ECHOCARDIOGRAM  01/01/2022  ? Normal LV size and function.  EF 55 to 60%.  No RWMA.  With A-fib, unable to assess DF.  Normal atrial sizes.  Normal RV size and function.  Normal PAP and borderline elevated RAP.  (Estimated 8 mmHg)  ? ?Family History  ?Problem Relation Age of Onset  ? Coronary artery disease Father 29  ? Hypertension Sister   ? Diabetes Brother   ?     Poorly controlled  ? Coronary artery disease Brother   ?     History of CABG  ? Peripheral Artery Disease Brother   ? Alcoholism Brother   ? Congestive Heart Failure Brother   ? Stroke Neg Hx   ? Colon cancer Neg Hx   ? Prostate cancer Neg Hx   ? ?Social History  ? ?Socioeconomic History  ? Marital status: Married  ?  Spouse name: Not on file  ? Number of children: 2  ? Years of education: Not on file  ? Highest education level: Not on file  ?Occupational History  ? Occupation: retired   ?Tobacco Use  ? Smoking status: Former  ?  Types: Cigarettes  ?  Quit date: 11/13/1976  ?  Years since quitting: 45.3  ? Smokeless tobacco: Never  ?Vaping Use  ? Vaping Use: Never used  ?Substance and Sexual Activity  ? Alcohol use: No  ? Drug use: No  ? Sexual activity: Never  ?Other Topics Concern  ? Not on file  ?Social History Narrative  ? Has 3 acres, works in the yard a lot x 6 hours sometimes, feels well.    ? 4 G-kids   ? Usually very active working on the property.  ?    ? ?Social Determinants of Health  ? ?Financial Resource Strain: Low Risk   ? Difficulty of Paying Living Expenses: Not hard at all  ?Food Insecurity: No Food Insecurity  ? Worried About Charity fundraiser in the Last Year: Never true  ? Ran Out of Food in the Last Year: Never true  ?Transportation Needs: No Transportation Needs  ? Lack of Transportation (Medical): No  ? Lack of Transportation (Non-Medical): No  ?Physical Activity: Sufficiently Active  ? Days  of Exercise per Week: 7 days  ? Minutes of Exercise per Session: 30 min  ?Stress: No Stress Concern Present  ? Feeling of Stress : Not at all  ?Social Connections: Moderately Integrated  ? Frequency of Communication with Friends and Family: Three times a week  ? Frequency of Social Gatherings with Friends and Family: More than three times a week  ? Attends Religious Services: More than 4 times per year  ? Active Member of Clubs or Organizations: No  ? Attends  Club or Organization Meetings: Never  ? Marital Status: Married  ? ? ?Tobacco Counseling ?Counseling given: Not Answered ? ? ?Clinical Intake: ? ?Pre-visit preparation completed: Yes ? ?Pain : No/denies pain ? ?  ? ?BMI - recorded: 28.37 ?Nutritional Status: BMI 25 -29 Overweight ?Nutritional Risks: Unintentional weight loss (when he had C-diff) ?Diabetes: Yes ?CBG done?: No ?Did pt. bring in CBG monitor from home?: No (phone visit) ? ?How often do you need to have someone help you when you read instructions, pamphlets, or other written materials from your doctor or pharmacy?: 1 - Never ? ?Diabetes: ? ?Is the patient diabetic?  Yes  ?If diabetic, was a CBG obtained today?  No  ?Did the patient bring in their glucometer from home?  No phone visit ?How often do you monitor your CBG's? never.  ? ?Financial Strains and Diabetes Management: ? ?Are you having any financial strains with the device, your supplies or your medication? No .  ?Does the patient want to be seen by Chronic Care Management for management of their diabetes?  No  ?Would the patient like to be referred to a Nutritionist or for Diabetic Management?  No  ? ?Diabetic Exams: ? ?Diabetic Eye Exam: Completed 11/2021-per patient.  ? ?Diabetic Foot Exam: Completed 02/26/2021.  ?Interpreter Needed?: No ? ?Information entered by :: Caroleen Hamman LPN ? ? ?Activities of Daily Living ? ?  02/24/2022  ? 11:10 AM 02/26/2021  ?  9:04 AM  ?In your present state of health, do you have any difficulty performing the  following activities:  ?Hearing? 0 1  ?Vision? 0 0  ?Difficulty concentrating or making decisions? 0 0  ?Walking or climbing stairs? 0 0  ?Dressing or bathing? 0 0  ?Doing errands, shopping? 0 0  ?Preparing Food and e

## 2022-02-24 NOTE — Patient Instructions (Signed)
Thomas Bradley , ?Thank you for taking time to complete your Medicare Wellness Visit. I appreciate your ongoing commitment to your health goals. Please review the following plan we discussed and let me know if I can assist you in the future.  ? ?Screening recommendations/referrals: ?Colonoscopy: Completed 12/11/2020-Due 12/11/2025 ?Recommended yearly ophthalmology/optometry visit for glaucoma screening and checkup ?Recommended yearly dental visit for hygiene and checkup ? ?Vaccinations: ?Influenza vaccine: Up to date ?Pneumococcal vaccine: Up to date ?Tdap vaccine: Up to date ?Shingles vaccine: Completed vaccines   ?Covid-19: Declined booster ? ?Advanced directives: May pick up information at your next office visit. ? ?Conditions/risks identified: See problem list ? ?Next appointment: Follow up in one year for your annual wellness visit.  ? ?Preventive Care 37 Years and Older, Male ?Preventive care refers to lifestyle choices and visits with your health care provider that can promote health and wellness. ?What does preventive care include? ?A yearly physical exam. This is also called an annual well check. ?Dental exams once or twice a year. ?Routine eye exams. Ask your health care provider how often you should have your eyes checked. ?Personal lifestyle choices, including: ?Daily care of your teeth and gums. ?Regular physical activity. ?Eating a healthy diet. ?Avoiding tobacco and drug use. ?Limiting alcohol use. ?Practicing safe sex. ?Taking low doses of aspirin every day. ?Taking vitamin and mineral supplements as recommended by your health care provider. ?What happens during an annual well check? ?The services and screenings done by your health care provider during your annual well check will depend on your age, overall health, lifestyle risk factors, and family history of disease. ?Counseling  ?Your health care provider may ask you questions about your: ?Alcohol use. ?Tobacco use. ?Drug use. ?Emotional well-being. ?Home  and relationship well-being. ?Sexual activity. ?Eating habits. ?History of falls. ?Memory and ability to understand (cognition). ?Work and work Statistician. ?Screening  ?You may have the following tests or measurements: ?Height, weight, and BMI. ?Blood pressure. ?Lipid and cholesterol levels. These may be checked every 5 years, or more frequently if you are over 54 years old. ?Skin check. ?Lung cancer screening. You may have this screening every year starting at age 35 if you have a 30-pack-year history of smoking and currently smoke or have quit within the past 15 years. ?Fecal occult blood test (FOBT) of the stool. You may have this test every year starting at age 37. ?Flexible sigmoidoscopy or colonoscopy. You may have a sigmoidoscopy every 5 years or a colonoscopy every 10 years starting at age 14. ?Prostate cancer screening. Recommendations will vary depending on your family history and other risks. ?Hepatitis C blood test. ?Hepatitis B blood test. ?Sexually transmitted disease (STD) testing. ?Diabetes screening. This is done by checking your blood sugar (glucose) after you have not eaten for a while (fasting). You may have this done every 1-3 years. ?Abdominal aortic aneurysm (AAA) screening. You may need this if you are a current or former smoker. ?Osteoporosis. You may be screened starting at age 71 if you are at high risk. ?Talk with your health care provider about your test results, treatment options, and if necessary, the need for more tests. ?Vaccines  ?Your health care provider may recommend certain vaccines, such as: ?Influenza vaccine. This is recommended every year. ?Tetanus, diphtheria, and acellular pertussis (Tdap, Td) vaccine. You may need a Td booster every 10 years. ?Zoster vaccine. You may need this after age 65. ?Pneumococcal 13-valent conjugate (PCV13) vaccine. One dose is recommended after age 39. ?Pneumococcal polysaccharide (PPSV23) vaccine.  One dose is recommended after age 88. ?Talk to  your health care provider about which screenings and vaccines you need and how often you need them. ?This information is not intended to replace advice given to you by your health care provider. Make sure you discuss any questions you have with your health care provider. ?Document Released: 11/09/2015 Document Revised: 07/02/2016 Document Reviewed: 08/14/2015 ?Elsevier Interactive Patient Education ? 2017 [place]. ? ?Fall Prevention in the Home ?Falls can cause injuries. They can happen to people of all ages. There are many things you can do to make your home safe and to help prevent falls. ?What can I do on the outside of my home? ?Regularly fix the edges of walkways and driveways and fix any cracks. ?Remove anything that might make you trip as you walk through a door, such as a raised step or threshold. ?Trim any bushes or trees on the path to your home. ?Use bright outdoor lighting. ?Clear any walking paths of anything that might make someone trip, such as rocks or tools. ?Regularly check to see if handrails are loose or broken. Make sure that both sides of any steps have handrails. ?Any raised decks and porches should have guardrails on the edges. ?Have any leaves, snow, or ice cleared regularly. ?Use sand or salt on walking paths during winter. ?Clean up any spills in your garage right away. This includes oil or grease spills. ?What can I do in the bathroom? ?Use night lights. ?Install grab bars by the toilet and in the tub and shower. Do not use towel bars as grab bars. ?Use non-skid mats or decals in the tub or shower. ?If you need to sit down in the shower, use a plastic, non-slip stool. ?Keep the floor dry. Clean up any water that spills on the floor as soon as it happens. ?Remove soap buildup in the tub or shower regularly. ?Attach bath mats securely with double-sided non-slip rug tape. ?Do not have throw rugs and other things on the floor that can make you trip. ?What can I do in the bedroom? ?Use  night lights. ?Make sure that you have a light by your bed that is easy to reach. ?Do not use any sheets or blankets that are too big for your bed. They should not hang down onto the floor. ?Have a firm chair that has side arms. You can use this for support while you get dressed. ?Do not have throw rugs and other things on the floor that can make you trip. ?What can I do in the kitchen? ?Clean up any spills right away. ?Avoid walking on wet floors. ?Keep items that you use a lot in easy-to-reach places. ?If you need to reach something above you, use a strong step stool that has a grab bar. ?Keep electrical cords out of the way. ?Do not use floor polish or wax that makes floors slippery. If you must use wax, use non-skid floor wax. ?Do not have throw rugs and other things on the floor that can make you trip. ?What can I do with my stairs? ?Do not leave any items on the stairs. ?Make sure that there are handrails on both sides of the stairs and use them. Fix handrails that are broken or loose. Make sure that handrails are as long as the stairways. ?Check any carpeting to make sure that it is firmly attached to the stairs. Fix any carpet that is loose or worn. ?Avoid having throw rugs at the top or bottom of  the stairs. If you do have throw rugs, attach them to the floor with carpet tape. ?Make sure that you have a light switch at the top of the stairs and the bottom of the stairs. If you do not have them, ask someone to add them for you. ?What else can I do to help prevent falls? ?Wear shoes that: ?Do not have high heels. ?Have rubber bottoms. ?Are comfortable and fit you well. ?Are closed at the toe. Do not wear sandals. ?If you use a stepladder: ?Make sure that it is fully opened. Do not climb a closed stepladder. ?Make sure that both sides of the stepladder are locked into place. ?Ask someone to hold it for you, if possible. ?Clearly mark and make sure that you can see: ?Any grab bars or handrails. ?First and last  steps. ?Where the edge of each step is. ?Use tools that help you move around (mobility aids) if they are needed. These include: ?Canes. ?Walkers. ?Scooters. ?Crutches. ?Turn on the lights when you go i

## 2022-03-03 ENCOUNTER — Ambulatory Visit (INDEPENDENT_AMBULATORY_CARE_PROVIDER_SITE_OTHER): Payer: Medicare Other | Admitting: Internal Medicine

## 2022-03-03 ENCOUNTER — Encounter: Payer: Self-pay | Admitting: Internal Medicine

## 2022-03-03 VITALS — BP 118/86 | HR 49 | Temp 98.2°F | Resp 16 | Ht 74.0 in | Wt 213.0 lb

## 2022-03-03 DIAGNOSIS — I4891 Unspecified atrial fibrillation: Secondary | ICD-10-CM | POA: Diagnosis not present

## 2022-03-03 DIAGNOSIS — R197 Diarrhea, unspecified: Secondary | ICD-10-CM | POA: Diagnosis not present

## 2022-03-03 NOTE — Patient Instructions (Signed)
Continue same medicines  ? ?Good hydration ? ?

## 2022-03-03 NOTE — Progress Notes (Signed)
? ?Subjective:  ? ? Patient ID: Thomas Bradley, male    DOB: Feb 10, 1946, 76 y.o.   MRN: [id number] ? ?DOS:  03/03/2022 ?Type of visit - description: Acute ? ?Has a history of C. difficile, symptoms were started February 2023, was seen here and subsequently by ID. ?He was treated with vancomycin, he finished antibiotics early April, at the time diarrhea essentially subsided. ?Diarrhea resurfaced 2 days ago: ?Very loose, brown stools, had 2 BMs daily. ?No fever chills ?No nausea or vomiting ?No blood in the stools ? ?Review of Systems ?See above  ? ?Past Medical History:  ?Diagnosis Date  ? Atrial fibrillation ([place]) 12/30/2021  ? 7-day Zio patch monitor: 100% A-fib  ? Benign prostatic hypertrophy   ? ED, sees urology every year  ? BPH (benign prostatic hypertrophy) with urinary obstruction   ? Dr. Risa Grill  ? Elevated PSA   ? Dr. Risa Grill  ? Erectile dysfunction due to arterial insufficiency   ? Dr. Risa Grill  ? Erythema multiforme   ? dx 2013   ? GERD (gastroesophageal reflux disease)   ? Hyperlipemia   ? Hypertension   ? Nocturia   ? Prostatitis   ? 2010, again 09-2011  ? ? ?Past Surgical History:  ?Procedure Laterality Date  ? 7-day Zio patch  12/2021  ? 100% A-fib.  Heart rate range 55 to 179 bpm.  (Slow rate, controlled VR and RVR) 3 short runs of NSVT (fastest and longest: 9 beats at a rate to 40 bpm).  Occasional PVCs  ? CARDIOVERSION N/A 02/17/2022  ? Procedure: CARDIOVERSION;  Surgeon: [name], MD;  Location: [facility] ENDOSCOPY;  Service: Cardiovascular;  Laterality: N/A;  ? HERNIA REPAIR  07/27/2010  ? open, R inguinal w/ mesh  ? TOTAL HIP ARTHROPLASTY Left 10/10/2019  ? Procedure: LEFT TOTAL HIP ARTHROPLASTY ANTERIOR APPROACH;  Surgeon: Frederik Pear, MD;  Location: [facility] ORS;  Service: Orthopedics;  Laterality: Left;  ? TRANSTHORACIC ECHOCARDIOGRAM  01/01/2022  ? Normal LV size and function.  EF 55 to 60%.  No RWMA.  With A-fib, unable to assess DF.  Normal atrial sizes.  Normal RV size and function.  Normal PAP  and borderline elevated RAP.  (Estimated 8 mmHg)  ? ? ?Current Outpatient Medications  ?Medication Instructions  ? amLODipine (NORVASC) 10 MG tablet TAKE 1 TABLET(10 MG) BY MOUTH DAILY  ? apixaban (ELIQUIS) 5 mg, Oral, 2 times daily  ? Cholecalciferol (VITAMIN D3) 2000 UNITS TABS 1 tablet, Oral, Daily, Reported on 02/01/2016  ? Cyanocobalamin (B-12) 2500 MCG TABS 1 tablet, Oral, Daily, Reported on 02/01/2016  ? doxazosin (CARDURA) 4 MG tablet TAKE 1 TABLET(4 MG) BY MOUTH DAILY  ? metoprolol succinate (TOPROL XL) 25 mg, Oral, Daily  ? Multiple Vitamin (MULTIVITAMIN) capsule 1 capsule, Oral, Daily, Reported on 02/01/2016  ? pravastatin (PRAVACHOL) 40 MG tablet TAKE 1 TABLET(40 MG) BY MOUTH AT BEDTIME  ? vitamin E 400 mg, Oral, Daily  ? ? ?   ?Objective:  ? Physical Exam ?BP 118/86 (BP Location: Left Arm, Patient Position: Sitting, Cuff Size: Small)   Pulse (!) 49   Temp 98.2 ?F (36.8 ?C) (Oral)   Resp 16   Ht '6\' 2"'$  (1.88 m)   Wt 213 lb (96.6 kg)   SpO2 95%   BMI 27.35 kg/m?  ?General:   ?Well developed, NAD, BMI noted.  ?HEENT:  ?Normocephalic . Face symmetric, atraumatic ?Abdomen:  ?Not distended, soft, non-tender. No rebound or rigidity.   ?Skin: Not pale. Not jaundice ?  Lower extremities: no pretibial edema bilaterally  ?Neurologic:  ?alert & oriented X3.  ?Speech normal, gait appropriate for age and unassisted ?Psych--  ?Cognition and judgment appear intact.  ?Cooperative with normal attention span and concentration.  ?Behavior appropriate. ?No anxious or depressed appearing. ? ?   ?Assessment   ? ?Assessment ?DM ?HTN (intol to losartan, 2018) ?Hyperlipidemia ?GERD ?Erythema multiforme 2013 ?GU: ?--BPH w/ elevated PSA, asymmetric prostate ?--Prostatitis 2010, 2012 ?--ED ?Has a epi-pen -- reason? Pt not sure, had an allergy to a medicine, name? ? ?PLAN:    ?C. difficile diarrhea  ?Symptoms a started approximately 3 months ago, was prescribed Vanco,  eventually seen by ID January 21, 2022, at that time he was feeling  better. Was told to expect intermittent diarrhea for a while.  No need to recheck his stool as it 'll remain positive for 6 months. ?After he finish vancomycin early April he was diarrhea free until 2 days ago. ?He is not toxic-appearing, has not taking any other antibiotics. ?Discussed with ID informally, will refer back to them if diarrhea continue and he is having several BMs daily.  Will notify patient ?Atrial fibrillation: Saw cardiology January 27, 2022, stress test was negative, anticoagulated, seems to tolerate well. ?RTC already scheduled for next month  ?

## 2022-03-04 ENCOUNTER — Encounter: Payer: Self-pay | Admitting: Internal Medicine

## 2022-03-04 NOTE — Assessment & Plan Note (Signed)
C. difficile diarrhea  ?Symptoms a started approximately 3 months ago, was prescribed Vanco,  eventually seen by ID January 21, 2022, at that time he was feeling better. Was told to expect intermittent diarrhea for a while.  No need to recheck his stool as it 'll remain positive for 6 months. ?After he finish vancomycin early April he was diarrhea free until 2 days ago. ?He is not toxic-appearing, has not taking any other antibiotics. ?Discussed with ID informally, will refer back to them if diarrhea continue and he is having several BMs daily.  Will notify patient ?Atrial fibrillation: Saw cardiology January 27, 2022, stress test was negative, anticoagulated, seems to tolerate well. ?RTC already scheduled for next month  ?

## 2022-03-05 ENCOUNTER — Encounter: Payer: Self-pay | Admitting: Cardiology

## 2022-03-05 ENCOUNTER — Ambulatory Visit (INDEPENDENT_AMBULATORY_CARE_PROVIDER_SITE_OTHER): Payer: Medicare Other | Admitting: Cardiology

## 2022-03-05 VITALS — BP 106/70 | HR 73 | Ht 74.0 in | Wt 211.6 lb

## 2022-03-05 DIAGNOSIS — R5383 Other fatigue: Secondary | ICD-10-CM | POA: Diagnosis not present

## 2022-03-05 DIAGNOSIS — E785 Hyperlipidemia, unspecified: Secondary | ICD-10-CM

## 2022-03-05 DIAGNOSIS — E119 Type 2 diabetes mellitus without complications: Secondary | ICD-10-CM

## 2022-03-05 DIAGNOSIS — I480 Paroxysmal atrial fibrillation: Secondary | ICD-10-CM

## 2022-03-05 DIAGNOSIS — I10 Essential (primary) hypertension: Secondary | ICD-10-CM

## 2022-03-05 NOTE — Progress Notes (Signed)
? ? ?Primary Care Provider: Colon Branch, MD ?[facility] Cardiologist: Thomas Hew, MD ?Electrophysiologist: None ? ?Clinic Note: ?Chief Complaint  ?Patient presents with  ? Follow-up  ?  Post cardioversion  ? Atrial Fibrillation  ?  Post cardioversion, feels much better.  Better energy.  ? ?=================================== ? ?ASSESSMENT/PLAN  ? ?Problem List Items Addressed This Visit   ? ?  ? Cardiology Problems  ? Paroxysmal atrial fibrillation (HCC) - Primary (Chronic)  ?  Based on how much he felt improvement in symptoms, he was symptomatic with fatigue related to A-fib.  He did not really notice arrhythmia itself, but definitely noted the fatigue associated with it. ? ?As such, would like to maintain sinus rhythm.  It is still possible that the A-fib was triggered by C. difficile but was relatively persistent. ? ?Plan: Continue low-dose Toprol for underlying rate control along with Eliquis for DOAC. ? ?He has recurrence of A-fib would have a low threshold to consider cardioversion. ? ?  ?  ? Relevant Orders  ? EKG 12-Lead (Completed)  ? Essential hypertension (Chronic)  ?  Blood pressure doing well with current dose of amlodipine and Toprol.  Need to monitor for signs of hypotension. ? ?  ?  ?  ? Other  ? Fatigue  ?  ClearCertainly fatigue has to do with a CT of, but he noted significant improvement with restoration of sinus rhythm.  This would suggest that perhaps the major symptom he has appointment A-fib is fatigue. ? ?  ?  ? Dyslipidemia (Chronic)  ?  Most recent LDL was 97 on pravastatin 40 mg. ?Thankfully, he had negative Myoview which did indicate nonobstructive CAD if there is any CAD.   ? ?Going forward, risk stratification with Coronary Calcium Score will be warranted in order to determine goals for lipid and glycemic control. ? ?  ?  ? Diabetes mellitus without complication (HCC) (Chronic)  ?  Not currently on any medication for diabetes despite A1c of 7.1. ? ?  ?   ? ?=================================== ? ?HPI:   ? ?Thomas [facility] is a 76 y.o. male with recurrent C. difficile colitis, and significant family history of CAD who is being seen today for follow-up evaluation of NEW ONSET ATRIAL FIBRILLATION at the request of Thomas Branch, MD. ? ?Thomas Bradley was seen by Dr. Larose Bradley on March 3 (having just undergone treatment for recurrent C. difficile colitis), requesting evaluation Coronary Calcium Score based on his family history: Older brother at age 37 with several years of CAD and PAD and advanced heart failure.  During his evaluation, he was found to be in atrial fibrillation, but Thomas Bradley did not notice any sensation of irregular heartbeats or palpitations.  He simply noted fatigue. ?Echocardiogram and event monitor ordered-results reviewed below. ?He was started on Eliquis ?He was referred to cardiology ? ?Thomas Bradley was seen in initial consultation on December 27, 2021, he was still in A-fib, noted generalized fatigue.  No chest pain or pressure.  No sensation of palpitations.   ?Started Toprol 25 mg along with continued Eliquis 5 mg daily. ?Referred for DCCV following 4 weeks of Eliquis. ?Myoview ordered ? ? ?Recent Hospitalizations:  ?DCCV 02/17/2022 ? ?Reviewed  CV studies:   ? ?The following studies were reviewed today: (if available, images/films reviewed: From Epic Chart or Care Everywhere) ?Myoview stress test January 30, 2022: Normal EF 55 to 60%.  LOW RISK.  No ischemia or infarction. ? ?Interval History:  ? ?Thomas Manns  [facility]ley presents today for follow-up after cardioversion.  He says he generally feels a lot better after cardioversion.  Energy level is notably improved.  He does not recall having felt irregular heartbeats or really fast heart rates.  What he does note is that the fatigue is generally gone.   ?He says that this is despite the fact that he has had yet again another round of C. difficile.  He has been referred to infectious disease as they are concerned about getting  another round of vancomycin.  As a result of the infection, he does still feel little bit rundown, but notably better having been restored to sinus rhythm.  No bleeding issues.  Stable of blood-tinged stools with wiping.   ? ?He actually is in great spirits.  Besides the GI issues feeling fine.  No chest pain pressure dyspnea with rest or exertion.  He is more active now with less exercise intolerance.  No PND, orthopnea or edema.  No syncope/near syncope or TIA/amaurosis fugax, claudication ? ?REVIEWED OF SYSTEMS  ? ?Review of Systems  ?Constitutional:  Positive for chills (Still has a little bit of chills but no real fevers.  Again dealing with C. difficile.). Negative for fever (No more fevers), malaise/fatigue (STEMI a little bit reduced energy with getting over his second round of C. difficile antibiotics.) and weight loss.  ?HENT:  Negative for nosebleeds.   ?Respiratory:  Negative for cough, sputum production and shortness of breath.   ?Cardiovascular:   ?     Per HPI  ?Gastrointestinal:  Positive for abdominal pain, blood in stool, diarrhea and nausea. Negative for melena.  ?     Clearing up symptoms of nausea, diarrhea with hematochezia.  Has not had any of the symptoms in the last couple weeks.  ?Genitourinary:  Negative for hematuria.  ?Musculoskeletal:  Positive for joint pain. Negative for falls.  ?Neurological:  Negative for dizziness, focal weakness and weakness (Still feels weakness and fatigue from C. difficile.).  ?Psychiatric/Behavioral:  Negative for depression and memory loss. The patient is not nervous/anxious and does not have insomnia.   ? ?I have reviewed and (if needed) personally updated the patient's problem list, medications, allergies, past medical and surgical history, social and family history.  ? ?PAST MEDICAL HISTORY  ? ?Past Medical History:  ?Diagnosis Date  ? Atrial fibrillation ([place]) 12/30/2021  ? 7-day Zio patch monitor: 100% A-fib  ? Benign prostatic hypertrophy   ? ED, sees  urology every year  ? BPH (benign prostatic hypertrophy) with urinary obstruction   ? Dr. Risa Grill  ? Elevated PSA   ? Dr. Risa Grill  ? Erectile dysfunction due to arterial insufficiency   ? Dr. Risa Grill  ? Erythema multiforme   ? dx 2013   ? GERD (gastroesophageal reflux disease)   ? Hyperlipemia   ? Hypertension   ? Nocturia   ? Prostatitis   ? 2010, again 09-2011  ? ? ?PAST SURGICAL HISTORY  ? ?Past Surgical History:  ?Procedure Laterality Date  ? 7-day Zio patch  12/2021  ? 100% A-fib.  Heart rate range 55 to 179 bpm.  (Slow rate, controlled VR and RVR) 3 short runs of NSVT (fastest and longest: 9 beats at a rate to 40 bpm).  Occasional PVCs  ? CARDIOVERSION N/A 02/17/2022  ? Procedure: CARDIOVERSION;  Surgeon: [name], MD;  Location: [facility];  Service: Cardiovascular;  Laterality: N/A;  ? HERNIA REPAIR  07/27/2010  ? open, R inguinal w/ mesh  ?  NM MYOVIEW LTD  01/30/2022  ? Normal EF 55 to 60%.  LOW RISK.  No ischemia or infarction.  ? TOTAL HIP ARTHROPLASTY Left 10/10/2019  ? Procedure: LEFT TOTAL HIP ARTHROPLASTY ANTERIOR APPROACH;  Surgeon: Frederik Pear, MD;  Location: [facility];  Service: Orthopedics;  Laterality: Left;  ? TRANSTHORACIC ECHOCARDIOGRAM  01/01/2022  ? Normal LV size and function.  EF 55 to 60%.  No RWMA.  With A-fib, unable to assess DF.  Normal atrial sizes.  Normal RV size and function.  Normal PAP and borderline elevated RAP.  (Estimated 8 mmHg)  ? ? ?Immunization History  ?Administered Date(s) Administered  ? Fluad Quad(high Dose 65+) 07/11/2019  ? Influenza, High Dose Seasonal PF 07/27/2013, 08/16/2018  ? Influenza,inj,Quad PF,6+ Mos 08/02/2015  ? Influenza-Unspecified 08/14/2016, 07/27/2017, 08/21/2021  ? PFIZER(Purple Top)SARS-COV-2 Vaccination 02/09/2020, 03/05/2020, 09/05/2020  ? Pneumococcal Conjugate-13 04/27/2014  ? Pneumococcal Polysaccharide-23 01/23/2009, 02/26/2021  ? Tdap 01/28/2011, 07/25/2015  ? Zoster Recombinat (Shingrix) 11/19/2021  ? Zoster, Live 07/22/2010   ? ? ?MEDICATIONS/ALLERGIES  ? ?Current Meds  ?Medication Sig  ? amLODipine (NORVASC) 10 MG tablet TAKE 1 TABLET(10 MG) BY MOUTH DAILY  ? apixaban (ELIQUIS) 5 MG TABS tablet Take 1 tablet (5 mg total) by mouth 2 (two) times daily.

## 2022-03-05 NOTE — Patient Instructions (Addendum)
Medication Instructions:  ? ?No changes ?*If you need a refill on your cardiac medications before your next appointment, please call your pharmacy* ? ?Other Instructions  ? ? If you do go back into Atrial fibrillation  during the week  . Call office for further instructions ?If you develop symptoms from the atrial fib over a weekend og to the [facility] ER for evaluation. ? ?Lab Work: ?Not needed ? ? ? ?Testing/Procedures: ?Not needed ? ? ?Follow-Up: ?At [facility], you and your health needs are our priority.  As part of our continuing mission to provide you with exceptional heart care, we have created designated Provider Care Teams.  These Care Teams include your primary Cardiologist (physician) and Advanced Practice Providers (APPs -  Physician Assistants and Nurse Practitioners) who all work together to provide you with the care you need, when you need it. ? ?  ? ?Your next appointment:   ?7 month(s) ? ?The format for your next appointment:   ?In Person ? ?Provider:   ?Glenetta Hew, MD  ? ? ?Other Instructions  ? ? If you do go back into Atrial fibrillation  during the week  . Call office for further instructions ?If you develop symptoms from the atrial fib over a weekend og to the [facility] ER for evaluation. ?

## 2022-03-06 ENCOUNTER — Encounter: Payer: Self-pay | Admitting: Internal Medicine

## 2022-03-06 ENCOUNTER — Other Ambulatory Visit: Payer: Self-pay

## 2022-03-06 ENCOUNTER — Telehealth: Payer: Self-pay

## 2022-03-06 ENCOUNTER — Other Ambulatory Visit: Payer: Self-pay | Admitting: Internal Medicine

## 2022-03-06 ENCOUNTER — Ambulatory Visit (INDEPENDENT_AMBULATORY_CARE_PROVIDER_SITE_OTHER): Payer: Medicare Other | Admitting: Internal Medicine

## 2022-03-06 VITALS — BP 104/73 | HR 73 | Resp 16 | Ht 74.0 in | Wt 212.0 lb

## 2022-03-06 DIAGNOSIS — A0472 Enterocolitis due to Clostridium difficile, not specified as recurrent: Secondary | ICD-10-CM

## 2022-03-06 MED ORDER — VANCOMYCIN HCL 125 MG PO CAPS: 125.0000 mg | ORAL_CAPSULE | Freq: Four times a day (QID) | ORAL | 0 refills | Status: DC

## 2022-03-06 MED ORDER — SODIUM CHLORIDE 0.9 % IV SOLN: 10.0000 mg/kg | Freq: Once | INTRAVENOUS | 0 refills | Status: AC

## 2022-03-06 NOTE — Assessment & Plan Note (Signed)
I discussed with him the natural history of C diff infection, post infectious IBS-like symptoms and his current symptoms.  I am concerned he is having a relapse since he was doing well and now suddenly with signficant diarrhea and weight loss.   ?I discussed the options and will retreat him with oral vancomycin for 2 weeks followed by an infusion of bezlotoxumab in about 2 weeks once his current symptoms (hopefully) have resolved.  I discussed the medication and indications and that it will require prior authorization.  ? ?I have personally spent 40 minutes involved in face-to-face and non-face-to-face activities for this patient on the day of the visit plus an additional 15 minutes same day. Professional time spent includes the following activities: Preparing to see the patient (review of tests), Obtaining and/or reviewing separately obtained history (admission/discharge record), Performing a medically appropriate examination and/or evaluation , Ordering medications/tests/procedures, referring and communicating with other health care professionals, Documenting clinical information in the EMR, Independently interpreting results (not separately reported), Communicating results to the patient/family/caregiver, Counseling and educating the patient/family/caregiver and Care coordination (not separately reported).  ?

## 2022-03-06 NOTE — Progress Notes (Signed)
? ?  Subjective:  ? ? Patient ID: [facility], male    DOB: Jun 10, 1946, 76 y.o.   MRN: [id number] ? ?HPI ?Here for follow up/work in visit for recent C diff infection. ?I saw him in March and with recurrent C diff infection and had lost some weight and was on oral vancomycin and did have improvement in his symptoms.  He is back today though with about 7-10 loose stools daily, particularly at night and after eating.  He experiences urgency and cramping abdominal pain.  Some bloating. Has lost about 3 lbs recently.   ? ? ?Review of Systems  ?Constitutional:  Negative for fatigue and fever.  ?Gastrointestinal:  Positive for diarrhea. Negative for nausea.  ? ?   ?Objective:  ? Physical Exam ?Eyes:  ?   General: No scleral icterus. ?Pulmonary:  ?   Effort: Pulmonary effort is normal.  ?Abdominal:  ?   General: Abdomen is flat. There is no distension.  ?Neurological:  ?   Mental Status: He is alert.  ? ?SH: no tobacco ? ? ? ?   ?Assessment & Plan:  ? ? ?

## 2022-03-06 NOTE — Telephone Encounter (Signed)
Called Short Stay and scheduled patient for 03/21/22 10 AM Infusion Zinplava '10mg'$ /kg Once per Dr Linus Salmons.  ? ?Patient aware of time, date and location of appointment and will start oral vanc today and finish 03/20/22 and get one time dose of Zinplava 03/21/22. ?

## 2022-03-08 ENCOUNTER — Encounter: Payer: Self-pay | Admitting: Cardiology

## 2022-03-08 NOTE — Assessment & Plan Note (Signed)
Blood pressure doing well with current dose of amlodipine and Toprol.  Need to monitor for signs of hypotension. ?

## 2022-03-08 NOTE — Assessment & Plan Note (Signed)
Not currently on any medication for diabetes despite A1c of 7.1. ?

## 2022-03-08 NOTE — Assessment & Plan Note (Signed)
Most recent LDL was 97 on pravastatin 40 mg. ?Thankfully, he had negative Myoview which did indicate nonobstructive CAD if there is any CAD.   ? ?Going forward, risk stratification with Coronary Calcium Score will be warranted in order to determine goals for lipid and glycemic control. ?

## 2022-03-08 NOTE — Assessment & Plan Note (Signed)
Based on how much he felt improvement in symptoms, he was symptomatic with fatigue related to A-fib.  He did not really notice arrhythmia itself, but definitely noted the fatigue associated with it. ? ?As such, would like to maintain sinus rhythm.  It is still possible that the A-fib was triggered by C. difficile but was relatively persistent. ? ?Plan: Continue low-dose Toprol for underlying rate control along with Eliquis for DOAC. ? ?He has recurrence of A-fib would have a low threshold to consider cardioversion. ?

## 2022-03-08 NOTE — Assessment & Plan Note (Signed)
ClearCertainly fatigue has to do with a CT of, but he noted significant improvement with restoration of sinus rhythm.  This would suggest that perhaps the major symptom he has appointment A-fib is fatigue. ?

## 2022-03-21 ENCOUNTER — Ambulatory Visit (HOSPITAL_COMMUNITY)
Admission: RE | Admit: 2022-03-21 | Discharge: 2022-03-21 | Disposition: A | Discharge status: Home / Self Care | Payer: Medicare Other | Source: Ambulatory Visit | Attending: Internal Medicine | Admitting: Internal Medicine

## 2022-03-21 ENCOUNTER — Telehealth: Payer: Self-pay

## 2022-03-21 DIAGNOSIS — A0472 Enterocolitis due to Clostridium difficile, not specified as recurrent: Secondary | ICD-10-CM | POA: Diagnosis not present

## 2022-03-21 MED ORDER — SODIUM CHLORIDE 0.9 % IV SOLN
1000.0000 mg | Freq: Once | INTRAVENOUS | Status: AC
  Administered 2022-03-21: 1000 mg via INTRAVENOUS
  Filled 2022-03-21: qty 40

## 2022-03-21 NOTE — Telephone Encounter (Signed)
Faxed Short Stay orders to [facility] for Zinplava x 1 per [facility]

## 2022-04-02 ENCOUNTER — Encounter: Payer: Self-pay | Admitting: Internal Medicine

## 2022-04-02 ENCOUNTER — Ambulatory Visit (INDEPENDENT_AMBULATORY_CARE_PROVIDER_SITE_OTHER): Payer: Medicare Other | Admitting: Internal Medicine

## 2022-04-02 VITALS — BP 126/74 | HR 47 | Temp 97.8°F | Resp 16 | Ht 74.0 in | Wt 217.4 lb

## 2022-04-02 DIAGNOSIS — E785 Hyperlipidemia, unspecified: Secondary | ICD-10-CM

## 2022-04-02 DIAGNOSIS — E119 Type 2 diabetes mellitus without complications: Secondary | ICD-10-CM | POA: Diagnosis not present

## 2022-04-02 DIAGNOSIS — I10 Essential (primary) hypertension: Secondary | ICD-10-CM

## 2022-04-02 LAB — LIPID PANEL
Cholesterol: 176 mg/dL (ref 0–200)
HDL: 47.3 mg/dL (ref >39.00)
LDL Cholesterol: 111 mg/dL — ABNORMAL HIGH (ref 0–99)
NonHDL: 129.1
Total CHOL/HDL Ratio: 4
Triglycerides: 92 mg/dL (ref 0.0–149.0)
VLDL: 18.4 mg/dL (ref 0.0–40.0)

## 2022-04-02 LAB — HEMOGLOBIN A1C: Hgb A1c MFr Bld: 7.1 % — ABNORMAL HIGH (ref 4.6–6.5)

## 2022-04-02 NOTE — Progress Notes (Signed)
Subjective:    Patient ID: [facility], male    DOB: Feb 29, 1946, 76 y.o.   MRN: [id number]  DOS:  04/02/2022 Type of visit - description: f/u  Since the last office visit is doing well. Had C. difficile diarrhea, saw ID, note reviewed.  Symptoms resolved.  Saw cardiology, note reviewed.  Asymptomatic.  Review of Systems Denies fever chills.  No nausea vomiting.   Past Medical History:  Diagnosis Date   Atrial fibrillation ([place]) 12/30/2021   7-day Zio patch monitor: 100% A-fib   Benign prostatic hypertrophy    ED, sees urology every year   BPH (benign prostatic hypertrophy) with urinary obstruction    Dr. Risa Grill   Elevated PSA    Dr. Risa Grill   Erectile dysfunction due to arterial insufficiency    Dr. Risa Grill   Erythema multiforme    dx 2013    GERD (gastroesophageal reflux disease)    Hyperlipemia    Hypertension    Nocturia    Prostatitis    2010, again 09-2011    Past Surgical History:  Procedure Laterality Date   7-day Zio patch  12/2021   100% A-fib.  Heart rate range 55 to 179 bpm.  (Slow rate, controlled VR and RVR) 3 short runs of NSVT (fastest and longest: 9 beats at a rate to 40 bpm).  Occasional PVCs   CARDIOVERSION N/A 02/17/2022   Procedure: CARDIOVERSION;  Surgeon: [name], MD;  Location: [facility] ENDOSCOPY;  Service: Cardiovascular;  Laterality: N/A;   HERNIA REPAIR  07/27/2010   open, R inguinal w/ mesh   NM MYOVIEW LTD  01/30/2022   Normal EF 55 to 60%.  LOW RISK.  No ischemia or infarction.   TOTAL HIP ARTHROPLASTY Left 10/10/2019   Procedure: LEFT TOTAL HIP ARTHROPLASTY ANTERIOR APPROACH;  Surgeon: Frederik Pear, MD;  Location: [facility]S;  Service: Orthopedics;  Laterality: Left;   TRANSTHORACIC ECHOCARDIOGRAM  01/01/2022   Normal LV size and function.  EF 55 to 60%.  No RWMA.  With A-fib, unable to assess DF.  Normal atrial sizes.  Normal RV size and function.  Normal PAP and borderline elevated RAP.  (Estimated 8 mmHg)    Current Outpatient  Medications  Medication Instructions   amLODipine (NORVASC) 10 MG tablet TAKE 1 TABLET(10 MG) BY MOUTH DAILY   apixaban (ELIQUIS) 5 mg, Oral, 2 times daily   Cholecalciferol (VITAMIN D3) 2000 UNITS TABS 1 tablet, Oral, Daily, Reported on 02/01/2016   Cyanocobalamin (B-12) 2500 MCG TABS 1 tablet, Oral, Daily, Reported on 02/01/2016   doxazosin (CARDURA) 4 MG tablet TAKE 1 TABLET(4 MG) BY MOUTH DAILY   metoprolol succinate (TOPROL XL) 25 mg, Oral, Daily   Multiple Vitamin (MULTIVITAMIN) capsule 1 capsule, Oral, Daily, Reported on 02/01/2016   pravastatin (PRAVACHOL) 40 MG tablet TAKE 1 TABLET(40 MG) BY MOUTH AT BEDTIME   vancomycin (VANCOCIN) 125 mg, Oral, 4 times daily   vitamin E 400 mg, Oral, Daily       Objective:   Physical Exam BP 126/74   Pulse (!) 47   Temp 97.8 F (36.6 C) (Oral)   Resp 16   Ht '6\' 2"'$  (1.88 m)   Wt 217 lb 6 oz (98.6 kg)   SpO2 95%   BMI 27.91 kg/m  General:   Well developed, NAD, BMI noted. HEENT:  Normocephalic . Face symmetric, atraumatic Lungs:  CTA B Normal respiratory effort, no intercostal retractions, no accessory muscle use. Heart: RRR,  no murmur.  DM foot  exam: No edema, good pedal pulses, pinprick examination Skin: Not pale. Not jaundice Neurologic:  alert & oriented X3.  Speech normal, gait appropriate for age and unassisted Psych--  Cognition and judgment appear intact.  Cooperative with normal attention span and concentration.  Behavior appropriate. No anxious or depressed appearing.      Assessment     Assessment DM HTN (intol to losartan, 2018) Hyperlipidemia GERD Erythema multiforme 2013 GU: --BPH w/ elevated PSA, asymmetric prostate --Prostatitis 2010, 2012 --ED Has a epi-pen -- reason? Pt not sure, had an allergy to a medicine, name?  PLAN:    DM: Diet controlled, previous A1c's reviewed with the patient, last was 7.1, recheck A1c, recommend healthy lifestyle, consider medications. Foot exam negative HTN: BP looks  very good on amlodipine, Cardura, metoprolol.  Recommend to monitor home High cholesterol: On Pravachol, checking labs. Atrial fibrillation: Saw cardiology 03/05/2022, fatigue was markedly improve after he converted to sinus rhythm, was rec to continue beta-blockers and anticoagulation. C. difficile diarrhea: Seen by ID Mar 06, 2022, he felt that he was having a relapse of C Diff, Rx vancomycin orally x2-week and infusion with bezlotoxumab later.  He is actually doing better.  No diarrhea at this point. Preventive care: Declines COVID booster, states he had to Shingrix vax x 2 RTC checkup 4 to 9-month

## 2022-04-02 NOTE — Patient Instructions (Addendum)
  Check the  blood pressure regularly BP GOAL is between 110/65 and  135/85. If it is consistently higher or lower, let me know     GO TO THE LAB : Get the blood work     Thomas Bradley, [place] back for  a check up in 4-5 months

## 2022-04-02 NOTE — Assessment & Plan Note (Signed)
DM: Diet controlled, previous A1c's reviewed with the patient, last was 7.1, recheck A1c, recommend healthy lifestyle, consider medications. Foot exam negative HTN: BP looks very good on amlodipine, Cardura, metoprolol.  Recommend to monitor home High cholesterol: On Pravachol, checking labs. Atrial fibrillation: Saw cardiology 03/05/2022, fatigue was markedly improve after he converted to sinus rhythm, was rec to continue beta-blockers and anticoagulation. C. difficile diarrhea: Seen by ID Mar 06, 2022, he felt that he was having a relapse of C Diff, Rx vancomycin orally x2-week and infusion with bezlotoxumab later.  He is actually doing better.  No diarrhea at this point. Preventive care: Declines COVID booster, states he had to Shingrix vax x 2 RTC checkup 4 to 35-month

## 2022-04-04 MED ORDER — PRAVASTATIN SODIUM 80 MG PO TABS: 80.0000 mg | ORAL_TABLET | Freq: Every day | ORAL | 1 refills | Status: DC

## 2022-04-04 NOTE — Addendum Note (Signed)
Addended byDamita Dunnings D on: 04/04/2022 08:23 AM   Modules accepted: Orders

## 2022-05-28 ENCOUNTER — Telehealth: Payer: Self-pay | Admitting: Internal Medicine

## 2022-05-28 NOTE — Telephone Encounter (Signed)
New onset Afib in 12/2021. Lifelong anticoagulation? Okay to refill?

## 2022-05-29 NOTE — Telephone Encounter (Signed)
Per cardiology note 03/05/2022: Continue metoprolol and Eliquis. Okay to refill x4 months, next cardiology visit December

## 2022-06-04 ENCOUNTER — Other Ambulatory Visit: Payer: Self-pay | Admitting: Internal Medicine

## 2022-08-06 DIAGNOSIS — Z23 Encounter for immunization: Secondary | ICD-10-CM | POA: Diagnosis not present

## 2022-09-02 ENCOUNTER — Encounter: Payer: Self-pay | Admitting: Internal Medicine

## 2022-09-02 ENCOUNTER — Ambulatory Visit (INDEPENDENT_AMBULATORY_CARE_PROVIDER_SITE_OTHER): Payer: Medicare Other | Admitting: Internal Medicine

## 2022-09-02 VITALS — BP 126/62 | HR 70 | Temp 97.7°F | Resp 18 | Ht 74.0 in | Wt 202.2 lb

## 2022-09-02 DIAGNOSIS — I4891 Unspecified atrial fibrillation: Secondary | ICD-10-CM

## 2022-09-02 DIAGNOSIS — E785 Hyperlipidemia, unspecified: Secondary | ICD-10-CM

## 2022-09-02 DIAGNOSIS — E119 Type 2 diabetes mellitus without complications: Secondary | ICD-10-CM | POA: Diagnosis not present

## 2022-09-02 DIAGNOSIS — I10 Essential (primary) hypertension: Secondary | ICD-10-CM | POA: Diagnosis not present

## 2022-09-02 LAB — BASIC METABOLIC PANEL
BUN: 22 mg/dL (ref 6–23)
CO2: 29 mEq/L (ref 19–32)
Calcium: 9.4 mg/dL (ref 8.4–10.5)
Chloride: 105 mEq/L (ref 96–112)
Creatinine, Ser: 1.12 mg/dL (ref 0.40–1.50)
GFR: 62.84 mL/min (ref >60.00)
Glucose, Bld: 107 mg/dL — ABNORMAL HIGH (ref 70–99)
Potassium: 4.9 mEq/L (ref 3.5–5.1)
Sodium: 141 mEq/L (ref 135–145)

## 2022-09-02 LAB — HEMOGLOBIN A1C: Hgb A1c MFr Bld: 6.3 % (ref 4.6–6.5)

## 2022-09-02 LAB — AST: AST: 16 U/L (ref 0–37)

## 2022-09-02 LAB — ALT: ALT: 13 U/L (ref 0–53)

## 2022-09-02 LAB — LIPID PANEL
Cholesterol: 148 mg/dL (ref 0–200)
HDL: 49.2 mg/dL (ref >39.00)
LDL Cholesterol: 81 mg/dL (ref 0–99)
NonHDL: 99.07
Total CHOL/HDL Ratio: 3
Triglycerides: 92 mg/dL (ref 0.0–149.0)
VLDL: 18.4 mg/dL (ref 0.0–40.0)

## 2022-09-02 NOTE — Assessment & Plan Note (Signed)
Preventive care reviewed -Td 2016 - RSV: d/w pt  -  pneumonia shot 2010 and 2022;  prevnar: 2015 - zostavax 07-22-10; S/P Shingrix -  covid vax  booster rec before, declined  - had a  flu shot -IDH:[id number] 2004,Dr Santogade, colonoscopy again 05-2010 -Dr Michail Sermon-, hyperplastic polyps, cscope 06-2015: no polyps. Cscope 11-2020, next per GI   -Prostate cancer screening: saw urology December 2022, at that time DRE was benign, last PSA was considered normal.  They elected to continue yearly screenings for now. Next urol visit 09-2022 - H-POA d/w pt

## 2022-09-02 NOTE — Patient Instructions (Addendum)
   GO TO THE LAB : Get the blood work     [place], [place] back for   checkup in 4 to 6 months    Do you have a "Living will" or "[place] of attorney"? (Advance care planning documents)  If you already have a living will or healthcare power of attorney, is recommended you bring the copy to be scanned in your chart. The document will be available to all the doctors you see in the system.  If you don't have one, please consider create one.  Advance care planning is a process that supports adults in  understanding and sharing their preferences regarding future medical care.   The patient's preferences are recorded in documents called Advance Directives and the can be modified at any time while the patient is in full mental capacity.   The documentation should be available at all times to the patient, the family and the healthcare providers.   This legal documents direct the family or surrogate to make the decision if the patient is not capable to do so.    Advance directives can be documented in many types of formats,  documents have names such as:  Lliving will  Durable power of attorney for healthcare (healthcare proxy or healthcare power of attorney)  Combined directives  Physician orders for life-sustaining treatment    More information at:  [email or web address]

## 2022-09-02 NOTE — Assessment & Plan Note (Signed)
DM: Last A1c 7.1, diet controlled, no changes made based on the last A1c.  He is doing great with diet, + weight loss, recheck A1c, consider metformin (patient will be somewhat reluctant). High cholesterol: Last LDL 111, Pravachol changed to 80 mg.  Checking a FLP AST ALT HTN: BP is very good, continue amlodipine, Cardura, metoprolol.  Last BMP okay. Atrial fibrillation: Eliquis cost is an issue, samples provided, referred  to our clinical pharmacist. Preventive care reviewed and documented separately. RTC 4 to 6 months

## 2022-09-02 NOTE — Progress Notes (Signed)
Subjective:    Patient ID: Thomas Bradley, male    DOB: 1946/03/25, 76 y.o.   MRN: [id number]  DOS:  09/02/2022 Type of visit - description: f/u  Routine office visit, general feeling well. Doing better with diet, has lost some weight.   Wt Readings from Last 3 Encounters:  09/02/22 202 lb 4 oz (91.7 kg)  04/02/22 217 lb 6 oz (98.6 kg)  03/06/22 212 lb (96.2 kg)     Review of Systems See above   Past Medical History:  Diagnosis Date   Atrial fibrillation ([place]) 12/30/2021   7-day Zio patch monitor: 100% A-fib   Benign prostatic hypertrophy    ED, sees urology every year   BPH (benign prostatic hypertrophy) with urinary obstruction    Dr. Risa Grill   Elevated PSA    Dr. Risa Grill   Erectile dysfunction due to arterial insufficiency    Dr. Risa Grill   Erythema multiforme    dx 2013    GERD (gastroesophageal reflux disease)    Hyperlipemia    Hypertension    Nocturia    Prostatitis    2010, again 09-2011    Past Surgical History:  Procedure Laterality Date   7-day Zio patch  12/2021   100% A-fib.  Heart rate range 55 to 179 bpm.  (Slow rate, controlled VR and RVR) 3 short runs of NSVT (fastest and longest: 9 beats at a rate to 40 bpm).  Occasional PVCs   CARDIOVERSION N/A 02/17/2022   Procedure: CARDIOVERSION;  Surgeon: [name], MD;  Location: [facility] ENDOSCOPY;  Service: Cardiovascular;  Laterality: N/A;   HERNIA REPAIR  07/27/2010   open, R inguinal w/ mesh   NM MYOVIEW LTD  01/30/2022   Normal EF 55 to 60%.  LOW RISK.  No ischemia or infarction.   TOTAL HIP ARTHROPLASTY Left 10/10/2019   Procedure: LEFT TOTAL HIP ARTHROPLASTY ANTERIOR APPROACH;  Surgeon: Frederik Pear, MD;  Location: [facility] ORS;  Service: Orthopedics;  Laterality: Left;   TRANSTHORACIC ECHOCARDIOGRAM  01/01/2022   Normal LV size and function.  EF 55 to 60%.  No RWMA.  With A-fib, unable to assess DF.  Normal atrial sizes.  Normal RV size and function.  Normal PAP and borderline elevated RAP.   (Estimated 8 mmHg)    Current Outpatient Medications  Medication Instructions   amLODipine (NORVASC) 10 MG tablet TAKE 1 TABLET(10 MG) BY MOUTH DAILY   Cholecalciferol (VITAMIN D3) 2000 UNITS TABS 1 tablet, Oral, Daily, Reported on 02/01/2016   Cyanocobalamin (B-12) 2500 MCG TABS 1 tablet, Oral, Daily, Reported on 02/01/2016   doxazosin (CARDURA) 4 MG tablet TAKE 1 TABLET(4 MG) BY MOUTH DAILY   ELIQUIS 5 MG TABS tablet TAKE 1 TABLET(5 MG) BY MOUTH TWICE DAILY   metoprolol succinate (TOPROL XL) 25 mg, Oral, Daily   Multiple Vitamin (MULTIVITAMIN) capsule 1 capsule, Oral, Daily, Reported on 02/01/2016   pravastatin (PRAVACHOL) 80 mg, Oral, Daily at bedtime   vitamin E 400 mg, Oral, Daily       Objective:   Physical Exam BP 126/62   Pulse 70   Temp 97.7 F (36.5 C) (Oral)   Resp 18   Ht '6\' 2"'$  (1.88 m)   Wt 202 lb 4 oz (91.7 kg)   SpO2 95%   BMI 25.97 kg/m  General: Well developed, NAD, BMI noted Neck: No  thyromegaly  HEENT:  Normocephalic . Face symmetric, atraumatic Lungs:  CTA B Normal respiratory effort, no intercostal retractions, no accessory muscle use.  Heart: Seems regular today  abdomen:  Not distended, soft, non-tender. No rebound or rigidity.   Lower extremities: no pretibial edema bilaterally  Skin: Exposed areas without rash. Not pale. Not jaundice Neurologic:  alert & oriented X3.  Speech normal, gait appropriate for age and unassisted Strength symmetric and appropriate for age.  Psych: Cognition and judgment appear intact.  Cooperative with normal attention span and concentration.  Behavior appropriate. No anxious or depressed appearing.     Assessment    Assessment DM HTN (intol to losartan, 2018) Hyperlipidemia GERD Erythema multiforme 2013 GU: --BPH w/ elevated PSA, asymmetric prostate --Prostatitis 2010, 2012 --ED Has a epi-pen -- reason? Pt not sure, had an allergy to a medicine, name? A-fib: New onset 12-2021.  Stress test - April 2023.   Anticoagulated  PLAN:    DM: Last A1c 7.1, diet controlled, no changes made based on the last A1c.  He is doing great with diet, + weight loss, recheck A1c, consider metformin (patient will be somewhat reluctant). High cholesterol: Last LDL 111, Pravachol changed to 80 mg.  Checking a FLP AST ALT HTN: BP is very good, continue amlodipine, Cardura, metoprolol.  Last BMP okay. Atrial fibrillation: Eliquis cost is an issue, samples provided, referred  to our clinical pharmacist. Preventive care reviewed and documented separately. RTC 4 to 6 months

## 2022-09-04 ENCOUNTER — Telehealth: Payer: Self-pay | Admitting: Pharmacist

## 2022-09-04 MED ORDER — APIXABAN 5 MG PO TABS: 5.0000 mg | ORAL_TABLET | Freq: Two times a day (BID) | ORAL | 0 refills | Status: DC

## 2022-09-04 NOTE — Telephone Encounter (Signed)
Patient is in Medicare coverage gap. Last 2 months Eliquis cost has been $151/month.  Patient called back and was screened for Eliquis patient assistance program. Initially he and his wife thought that annual income could qualify (for household of 2 has to be < $59,160). Called [facility] and Mr. Mccravy has spent [phone] so far in 2023 out of pocket on medications and Mrs. Terrill $299. They have spent 3% of income. Requested copy of pharmacy report.  Started application for Eliquis but when I called back Mr. Neville reports that when they include their annuity income, they are over the patient assistance program income limit.  Will try to help as able with samples. Patient current has 2 weeks of samples.

## 2022-09-04 NOTE — Telephone Encounter (Signed)
Patient can use 1 time only a 30 day free coupon. Called and provided [facility] with coupon information BIN: [id number] / PCN: [id number] / GRP: [id number]  / ID: [id number] Update Rx for Eliquis sent to pharmacy.

## 2022-09-04 NOTE — Telephone Encounter (Signed)
Received the message below regarding referral for Eliquis assistance.  Tried to call patient. No answer but LM on VM with my contact info [phone] or [phone].        Eliquis Assistance Received: 2 days ago Canter, [place], CMA  Cynthia Stainback, RPH-CPP Good morning, Wanted to let you know we placed a referral to you for this Pt. He is having a hard time paying for his Eliquis, unsure if he qualifies for their Pt assistance or not. Would you be able to see him/speak to him within the next several days? I've given him 2 boxes of samples.  Thank you, [facility]

## 2022-09-04 NOTE — Addendum Note (Signed)
Addended by: Cherre Robins B on: 09/04/2022 01:25 PM   Modules accepted: Orders

## 2022-09-27 ENCOUNTER — Other Ambulatory Visit: Payer: Self-pay | Admitting: Internal Medicine

## 2022-10-01 ENCOUNTER — Encounter: Payer: Self-pay | Admitting: Cardiology

## 2022-10-01 ENCOUNTER — Ambulatory Visit: Payer: Medicare Other | Attending: Cardiology | Admitting: Cardiology

## 2022-10-01 VITALS — BP 100/60 | HR 64 | Ht 74.0 in | Wt 206.8 lb

## 2022-10-01 DIAGNOSIS — E785 Hyperlipidemia, unspecified: Secondary | ICD-10-CM

## 2022-10-01 DIAGNOSIS — E1169 Type 2 diabetes mellitus with other specified complication: Secondary | ICD-10-CM

## 2022-10-01 DIAGNOSIS — I10 Essential (primary) hypertension: Secondary | ICD-10-CM | POA: Diagnosis not present

## 2022-10-01 DIAGNOSIS — I480 Paroxysmal atrial fibrillation: Secondary | ICD-10-CM | POA: Diagnosis not present

## 2022-10-01 DIAGNOSIS — D6869 Other thrombophilia: Secondary | ICD-10-CM

## 2022-10-01 MED ORDER — AMLODIPINE BESYLATE 5 MG PO TABS: 5.0000 mg | ORAL_TABLET | Freq: Every day | ORAL | 3 refills | Status: DC

## 2022-10-01 NOTE — Patient Instructions (Signed)
Medication Instructions:   Decrease Amlodipine 5 mg daily    *If you need a refill on your cardiac medications before your next appointment, please call your pharmacy*   Lab Work: Not needed    Testing/Procedures: Not needed   Follow-Up: At [facility], you and your health needs are our priority.  As part of our continuing mission to provide you with exceptional heart care, we have created designated Provider Care Teams.  These Care Teams include your primary Cardiologist (physician) and Advanced Practice Providers (APPs -  Physician Assistants and Nurse Practitioners) who all work together to provide you with the care you need, when you need it.     Your next appointment:   12 month(s)  The format for your next appointment:   In Person  Provider:   Glenetta Hew, MD

## 2022-10-01 NOTE — Progress Notes (Signed)
Primary Care Provider: Colon Branch, [place] Cardiologist: Glenetta Hew, MD Electrophysiologist: None  Clinic Note: Chief Complaint  Patient presents with   Follow-up    6 months.  Doing well.  No further A-fib symptoms.   Atrial Fibrillation    Well-controlled.  No bleeding issues.  No further fatigue episodes.   ===================================  ASSESSMENT/PLAN   Problem List Items Addressed This Visit       Cardiology Problems   Paroxysmal atrial fibrillation (HCC) - Primary (Chronic)    Overall seems pretty stable.  What he really noted was fatigue and exertional dyspnea when he was in A-fib.  So far no further episodes.  Plan will be to continue rate control only with low-dose Toprol. Is on Eliquis for his DOAC for CVA prophylaxis.  For breakthrough of his, if he is symptomatic, would recommend early cardioversion since he is on Eliquis.      Relevant Medications   amLODipine (NORVASC) 5 MG tablet   Other Relevant Orders   EKG 12-Lead (Completed)   Hypercoagulable state due to paroxysmal atrial fibrillation (HCC); CHA2DS2-VASc score 4 (HTN, DM, age x 2) (Chronic)    With CHA2DS2-VASc or 4, is on Eliquis for stroke prophylaxis.  Tolerating well.  Financial issues noted.  We talked about asking for samples in the mid year to prolong the onset of the donut hole.  Okay to hold Eliquis 24 to 48 hours prior to Mohs procedures and surgeries, for more high risk procedures such as neurologic procedures organ biopsies, would hold 3 days.      Relevant Medications   amLODipine (NORVASC) 5 MG tablet   Essential hypertension (Chronic)    Blood pressure actually pretty low today.  I think we can probably reduce the amlodipine down to 5 mg as opposed to 10.  Encourage adequate hydration.      Relevant Medications   amLODipine (NORVASC) 5 MG tablet   Other Relevant Orders   EKG 12-Lead (Completed)     Other   Dyslipidemia associated with type 2  diabetes mellitus (HCC) (Chronic)    Remarkable improvement in LDL from last check on increased dose of pravastatin.  Similarly notable improvement in A1c without metformin.  Continue to monitor.   Continue to monitor.        ===================================  HPI:    Thomas Bradley is a 76 y.o. male with a PMH notable for relatively new onset PAROXYSMAL A-FIB, with HTN, HLD, DM-2 and significant family history of CAD, who presents today for 46-monthfollow-up for A-fib at the request of PColon Branch MD.  HDEVONNE LALANIwas last seen on on Mar 05, 2022 for post cardioversion follow-up.  He noted feeling a lot better since cardioversion.  Energy improved.  When he really noted 1 was he was in A-fib was feeling fatigued and worn out.  He did not notice any palpitations or irregular or fast heart rates.  Unfortunately, he had another round of C. difficile and was referred to ID.  He was therefore feeling a bit rundown.  Otherwise was in good spirits besides the GI issues.  No chest pain pressure or dyspnea at rest or exertion.  No PND orthopnea edema.  Recent Hospitalizations: None  Was just seen by his PCP on November 7 -> previous A1c had been 7.1-labs rechecked and it was 6.3.  Previous LDL had been 111-decreased to 81 after increasing Pravachol to 80 mg.  Noted cost issue with Eliquis.  Samples  provided.  Referred to clinical pharmacist for assistance.  Reviewed  CV studies:    The following studies were reviewed today: (if available, images/films reviewed: From Epic Chart or Care Everywhere) None:  Interval History:   Thomas Bradley presents here today overall doing pretty well from a cardiac standpoint.  He is a little bit down stating that his older brother died at age 2 on 2023-10-25 and then his sister who was 71 died a month or so before that.  He is not sure of the details of how and why they died, but is just a little bit upset.    For chronic standpoint, he seems to be doing  okay just a little lightheaded and dizzy off and on, worse when he stands up but has not noted any tachycardic symptoms.  Not really having the fatigue issues that he was having.  Thankfully no more C. difficile issues.  CV Review of Symptoms (Summary): no chest pain or dyspnea on exertion positive for - some mild dizziness and lightheadedness, orthostatic symptoms. negative for - edema, irregular heartbeat, orthopnea, palpitations, paroxysmal nocturnal dyspnea, rapid heart rate, shortness of breath, or fatigue and weakness that he noted when he was in A-fib.  Syncope/near syncope or TIA/amaurosis fugax, claudication  REVIEWED OF SYSTEMS   Review of Systems  Constitutional:  Negative for malaise/fatigue and weight loss.  HENT:  Negative for congestion and nosebleeds.   Respiratory:  Negative for cough and shortness of breath.   Gastrointestinal:  Negative for blood in stool and melena.  Genitourinary:  Negative for hematuria.  Musculoskeletal:  Positive for joint pain. Negative for myalgias.  Neurological:  Positive for dizziness. Negative for weakness and headaches.  Psychiatric/Behavioral: Negative.    All other systems reviewed and are negative.   I have reviewed and (if needed) personally updated the patient's problem list, medications, allergies, past medical and surgical history, social and family history.   PAST MEDICAL HISTORY   Past Medical History:  Diagnosis Date   Atrial fibrillation ([place]) 12/30/2021   7-day Zio patch monitor: 100% A-fib   Benign prostatic hypertrophy    ED, sees urology every year   BPH (benign prostatic hypertrophy) with urinary obstruction    Dr. Risa Grill   Elevated PSA    Dr. Risa Grill   Erectile dysfunction due to arterial insufficiency    Dr. Risa Grill   Erythema multiforme    dx 2013    GERD (gastroesophageal reflux disease)    Hyperlipemia    Hypertension    Nocturia    Prostatitis    2010, again 09-2011    PAST SURGICAL HISTORY   Past  Surgical History:  Procedure Laterality Date   7-day Zio patch  12/2021   100% A-fib.  Heart rate range 55 to 179 bpm.  (Slow rate, controlled VR and RVR) 3 short runs of NSVT (fastest and longest: 9 beats at a rate to 40 bpm).  Occasional PVCs   CARDIOVERSION N/A 02/17/2022   Procedure: CARDIOVERSION;  Surgeon: [name], MD;  Location: [facility] ENDOSCOPY;  Service: Cardiovascular;  Laterality: N/A;   HERNIA REPAIR  07/27/2010   open, R inguinal w/ mesh   NM MYOVIEW LTD  01/30/2022   Normal EF 55 to 60%.  LOW RISK.  No ischemia or infarction.   TOTAL HIP ARTHROPLASTY Left 10/10/2019   Procedure: LEFT TOTAL HIP ARTHROPLASTY ANTERIOR APPROACH;  Surgeon: Frederik Pear, MD;  Location: [facility] ORS;  Service: Orthopedics;  Laterality: Left;   TRANSTHORACIC ECHOCARDIOGRAM  01/01/2022   Normal LV size and function.  EF 55 to 60%.  No RWMA.  With A-fib, unable to assess DF.  Normal atrial sizes.  Normal RV size and function.  Normal PAP and borderline elevated RAP.  (Estimated 8 mmHg)    Immunization History  Administered Date(s) Administered   Fluad Quad(high Dose 65+) 07/11/2019   Influenza Split 08/27/2022   Influenza, High Dose Seasonal PF 07/27/2013, 08/16/2018   Influenza,inj,Quad PF,6+ Mos 08/02/2015   Influenza-Unspecified 08/14/2016, 07/27/2017, 08/21/2021   PFIZER(Purple Top)SARS-COV-2 Vaccination 02/09/2020, 03/05/2020, 09/05/2020   Pneumococcal Conjugate-13 04/27/2014   Pneumococcal Polysaccharide-23 01/23/2009, 02/26/2021   Tdap 01/28/2011, 07/25/2015   Zoster Recombinat (Shingrix) 11/19/2021, 01/17/2022   Zoster, Live 07/22/2010    MEDICATIONS/ALLERGIES   Current Meds  Medication Sig   amLODipine (NORVASC) 10 MG tablet TAKE 1 TABLET(10 MG) BY MOUTH DAILY   Cholecalciferol (VITAMIN D3) 2000 UNITS TABS Take 1 tablet by mouth daily. Reported on 02/01/2016   Cyanocobalamin (B-12) 2500 MCG TABS Take 1 tablet by mouth daily. Reported on 02/01/2016   doxazosin (CARDURA) 4 MG tablet  TAKE 1 TABLET(4 MG) BY MOUTH DAILY   ELIQUIS 5 MG TABS tablet TAKE 1 TABLET(5 MG) BY MOUTH TWICE DAILY   metoprolol succinate (TOPROL XL) 25 MG 24 hr tablet Take 1 tablet (25 mg total) by mouth daily.   Multiple Vitamin (MULTIVITAMIN) capsule Take 1 capsule by mouth daily. Reported on 02/01/2016   pravastatin (PRAVACHOL) 80 MG tablet Take 1 tablet (80 mg total) by mouth at bedtime.   vitamin E 400 UNIT capsule Take 400 mg by mouth daily.    No Known Allergies  SOCIAL HISTORY/FAMILY HISTORY   Reviewed in Epic:  Pertinent findings:  Social History   Tobacco Use   Smoking status: Former    Types: Cigarettes    Quit date: 11/13/1976    Years since quitting: 45.9   Smokeless tobacco: Never  Vaping Use   Vaping Use: Never used  Substance Use Topics   Alcohol use: No   Drug use: No   Social History   Social History Narrative   Has 3 acres, works in the yard a lot x 6 hours sometimes, feels well.     4 G-kids    Usually very active working on the property.        OBJCTIVE -PE, EKG, labs   Wt Readings from Last 3 Encounters:  10/01/22 206 lb 12.8 oz (93.8 kg)  09/02/22 202 lb 4 oz (91.7 kg)  04/02/22 217 lb 6 oz (98.6 kg)    Physical Exam: BP 100/60 (BP Location: Right Arm, Patient Position: Sitting, Cuff Size: Normal)   Pulse 64   Ht '6\' 2"'$  (1.88 m)   Wt 206 lb 12.8 oz (93.8 kg)   SpO2 97%   BMI 26.55 kg/m  Physical Exam Vitals reviewed.  Constitutional:      General: He is not in acute distress.    Appearance: Normal appearance. He is normal weight. He is not ill-appearing or toxic-appearing.  HENT:     Head: Normocephalic and atraumatic.  Neck:     Vascular: No carotid bruit or JVD.  Cardiovascular:     Rate and Rhythm: Normal rate and regular rhythm. No extrasystoles are present.    Chest Wall: PMI is not displaced.     Pulses: Normal pulses.     Heart sounds: S1 normal and S2 normal. Heart sounds are distant. No murmur heard.    No friction rub. No gallop.  Pulmonary:     Effort: Pulmonary effort is normal. No respiratory distress.     Breath sounds: Normal breath sounds. No wheezing, rhonchi or rales.  Musculoskeletal:        General: No swelling. Normal range of motion.     Cervical back: Normal range of motion and neck supple.  Skin:    General: Skin is warm and dry.  Neurological:     General: No focal deficit present.     Mental Status: He is alert and oriented to person, place, and time.     Gait: Gait normal.  Psychiatric:        Mood and Affect: Mood normal.        Behavior: Behavior normal.        Thought Content: Thought content normal.        Judgment: Judgment normal.     Adult ECG Report  Rate: 64 ;  Rhythm: normal sinus rhythm and normal axis, intervals and durations. ;   Narrative Interpretation: Stable  Recent Labs: Reviewed. Lab Results  Component Value Date   CHOL 148 09/02/2022   HDL 49.20 09/02/2022   LDLCALC 81 09/02/2022   LDLDIRECT 175.1 05/03/2008   TRIG 92.0 09/02/2022   CHOLHDL 3 09/02/2022   Lab Results  Component Value Date   CREATININE 1.12 09/02/2022   BUN 22 09/02/2022   NA 141 09/02/2022   K 4.9 09/02/2022   CL 105 09/02/2022   CO2 29 09/02/2022      Latest Ref Rng & Units 02/10/2022    2:22 PM 01/14/2022    2:22 PM 12/20/2021    2:03 PM  CBC  WBC 3.4 - 10.8 x10E3/uL 5.0  11.5  9.6   Hemoglobin 13.0 - 17.7 g/dL 14.0  14.9  15.1   Hematocrit 37.5 - 51.0 % 42.4  45.0  45.1   Platelets 150 - 450 x10E3/uL 150  154.0  168     Lab Results  Component Value Date   HGBA1C 6.3 09/02/2022   Lab Results  Component Value Date   TSH 1.70 12/30/2021    ================================================== I spent a total of 23 minutes with the patient spent in direct patient consultation.  Additional time spent with chart review  / charting (studies, outside notes, etc): 14 min Total Time: 37 min  Current medicines are reviewed at length with the patient today.  (+/- concerns)  N/A  Notice: This dictation was prepared with Dragon dictation along with smart phrase technology. Any transcriptional errors that result from this process are unintentional and may not be corrected upon review.  Studies Ordered:   Orders Placed This Encounter  Procedures   EKG 12-Lead   Meds ordered this encounter  Medications   amLODipine (NORVASC) 5 MG tablet    Sig: Take 1 tablet (5 mg total) by mouth daily.    Dispense:  90 tablet    Refill:  3    Discontinue 5 mg dose    Patient Instructions / Medication Changes & Studies & Tests Ordered   Patient Instructions  Medication Instructions:   Decrease Amlodipine 5 mg daily    *If you need a refill on your cardiac medications before your next appointment, please call your pharmacy*   Lab Work: Not needed    Testing/Procedures: Not needed   Follow-Up: At [facility], you and your health needs are our priority.  As part of our continuing mission to provide you with exceptional heart care, we have created designated Provider Care  Teams.  These Care Teams include your primary Cardiologist (physician) and Advanced Practice Providers (APPs -  Physician Assistants and Nurse Practitioners) who all work together to provide you with the care you need, when you need it.     Your next appointment:   12 month(s)  The format for your next appointment:   In Person  Provider:   Glenetta Hew, MD         Leonie Man, MD, MS Glenetta Hew, M.D., M.S. Interventional Cardiologist  [place]  Pager # [phone] Phone # [phone] [address] [place], [place] [address]   Thank you for choosing [facility] [place]!!

## 2022-10-02 ENCOUNTER — Telehealth: Payer: Self-pay | Admitting: Cardiology

## 2022-10-02 DIAGNOSIS — Z125 Encounter for screening for malignant neoplasm of prostate: Secondary | ICD-10-CM | POA: Diagnosis not present

## 2022-10-02 LAB — PSA: PSA: 2.59

## 2022-10-02 NOTE — Telephone Encounter (Signed)
Patient states that he was seen yesterday, and he thought the Metoprolol was to be decreased, but his AVS and medication sent in was for Amlodipine 5 mg- he wants to make sure this is correct. Will send to MD to clarify as patient thought it was the Metoprolol to be decreased.   Thanks!

## 2022-10-02 NOTE — Telephone Encounter (Signed)
Pt c/o medication issue:  1. Name of Medication:  metoprolol succinate (TOPROL XL) 25 MG 24 hr tablet  2. How are you currently taking this medication (dosage and times per day)?   3. Are you having a reaction (difficulty breathing--STAT)?   4. What is your medication issue?   Patient states that it is possible that he misunderstood Dr. Ellyn Hack, but he thought they discussed having Metoprolol cut in half, but the pharmacy doesn't have a Rx for Metoprolol. He state the pharmacy received an order for him to take half a tablet of Amlodipine. Please clarify.

## 2022-10-03 NOTE — Telephone Encounter (Signed)
Medication intende to decrease was Amlodipine - reduce to 5 mg (1/2 tab of existing pill)

## 2022-10-03 NOTE — Telephone Encounter (Signed)
Called patient . Patient is aware Dr Ellyn Hack would like for him to decrease Amlodpine to 5 mg daily.  Patient verbalized understanding. Patient states he has picked up medication from the pharmacy . He was not able to split the 10 mg tablets.

## 2022-10-09 DIAGNOSIS — R351 Nocturia: Secondary | ICD-10-CM | POA: Diagnosis not present

## 2022-10-09 DIAGNOSIS — N401 Enlarged prostate with lower urinary tract symptoms: Secondary | ICD-10-CM | POA: Diagnosis not present

## 2022-10-10 ENCOUNTER — Encounter: Payer: Self-pay | Admitting: Internal Medicine

## 2022-10-21 ENCOUNTER — Encounter: Payer: Self-pay | Admitting: Cardiology

## 2022-10-21 DIAGNOSIS — I480 Paroxysmal atrial fibrillation: Secondary | ICD-10-CM | POA: Insufficient documentation

## 2022-10-21 NOTE — Assessment & Plan Note (Signed)
Blood pressure actually pretty low today.  I think we can probably reduce the amlodipine down to 5 mg as opposed to 10.  Encourage adequate hydration.

## 2022-10-21 NOTE — Assessment & Plan Note (Signed)
Overall seems pretty stable.  What he really noted was fatigue and exertional dyspnea when he was in A-fib.  So far no further episodes.  Plan will be to continue rate control only with low-dose Toprol. Is on Eliquis for his DOAC for CVA prophylaxis.  For breakthrough of his, if he is symptomatic, would recommend early cardioversion since he is on Eliquis.

## 2022-10-21 NOTE — Assessment & Plan Note (Signed)
With CHA2DS2-VASc or 4, is on Eliquis for stroke prophylaxis.  Tolerating well.  Financial issues noted.  We talked about asking for samples in the mid year to prolong the onset of the donut hole.  Okay to hold Eliquis 24 to 48 hours prior to Mohs procedures and surgeries, for more high risk procedures such as neurologic procedures organ biopsies, would hold 3 days.

## 2022-10-21 NOTE — Assessment & Plan Note (Addendum)
Remarkable improvement in LDL from last check on increased dose of pravastatin.  Similarly notable improvement in A1c without metformin.  Continue to monitor.   Continue to monitor.

## 2022-10-25 ENCOUNTER — Other Ambulatory Visit: Payer: Self-pay | Admitting: Internal Medicine

## 2022-11-04 ENCOUNTER — Other Ambulatory Visit: Payer: Self-pay | Admitting: Internal Medicine

## 2022-11-04 NOTE — Telephone Encounter (Signed)
Refill request for Eliquis- okay to refill?

## 2022-11-04 NOTE — Telephone Encounter (Signed)
Rx sent 

## 2022-11-04 NOTE — Telephone Encounter (Signed)
Next visit with cardiology by December 2024.  Okay refill 6 months

## 2022-12-01 ENCOUNTER — Other Ambulatory Visit: Payer: Self-pay | Admitting: Internal Medicine

## 2022-12-02 DIAGNOSIS — N390 Urinary tract infection, site not specified: Secondary | ICD-10-CM | POA: Diagnosis not present

## 2022-12-03 ENCOUNTER — Encounter: Payer: Self-pay | Admitting: Family Medicine

## 2022-12-03 ENCOUNTER — Ambulatory Visit (INDEPENDENT_AMBULATORY_CARE_PROVIDER_SITE_OTHER): Payer: Medicare PPO | Admitting: Family Medicine

## 2022-12-03 VITALS — BP 136/84 | HR 80 | Temp 97.0°F | Ht 74.0 in | Wt 210.4 lb

## 2022-12-03 DIAGNOSIS — N12 Tubulo-interstitial nephritis, not specified as acute or chronic: Secondary | ICD-10-CM

## 2022-12-03 LAB — POCT URINALYSIS DIPSTICK
Bilirubin, UA: NEGATIVE
Glucose, UA: NEGATIVE
Ketones, UA: NEGATIVE
Nitrite, UA: POSITIVE
Protein, UA: NEGATIVE
Spec Grav, UA: <=1.005 — AB (ref 1.010–1.025)
Urobilinogen, UA: 0.2 E.U./dL
pH, UA: 5 (ref 5.0–8.0)

## 2022-12-03 MED ORDER — CEFTRIAXONE SODIUM 1 G IJ SOLR
1.0000 g | Freq: Once | INTRAMUSCULAR | Status: AC
  Administered 2022-12-03: 1 g via INTRAMUSCULAR

## 2022-12-03 MED ORDER — ONDANSETRON HCL 4 MG PO TABS: 4.0000 mg | ORAL_TABLET | Freq: Three times a day (TID) | ORAL | 0 refills | Status: DC | PRN

## 2022-12-03 MED ORDER — CEFDINIR 300 MG PO CAPS: 300.0000 mg | ORAL_CAPSULE | Freq: Two times a day (BID) | ORAL | 0 refills | Status: AC

## 2022-12-03 MED ORDER — ACETAMINOPHEN ER 650 MG PO TBCR: 650.0000 mg | EXTENDED_RELEASE_TABLET | Freq: Three times a day (TID) | ORAL | Status: DC | PRN

## 2022-12-03 MED ORDER — TRAMADOL HCL 50 MG PO TABS: 50.0000 mg | ORAL_TABLET | Freq: Three times a day (TID) | ORAL | 0 refills | Status: AC | PRN

## 2022-12-03 NOTE — Patient Instructions (Signed)
For your UTI, please stop taking ciprofloxacin.  You were given a shot of Rocephin and you should start taking cefdinir.  May you may use Tylenol and tramadol as needed for pain.  You may use Zofran as needed for nausea.  We are sending urine off for culture, however if the other culture comes back please be sure to reach out to the clinic with the results just to ensure that the antibiotics we are using are covering her symptoms appropriately.  If you are not improving within 24 hours or if you develop severe abdominal pain, back pain, nausea, fevers, chills or any other worrisome symptom please go to the emergency department.

## 2022-12-03 NOTE — Progress Notes (Signed)
Assessment/Plan:   Problem List Items Addressed This Visit       Genitourinary   Pyelonephritis - Primary    Differential diagnosis: - Pyelonephritis - Urinary calculus, unspecified  A pyelonephritis is the most likely diagnosis based on the presence of nitrates in the urine, symptoms of dysuria, fever, and chills. Cystitis is considered given the dysuria and urinary frequency symptoms. A urinary calculus, though possible, appears less likely as the patient's symptoms are suggestive of infection rather than obstruction.  Plan: The patient will be administered a dose of Rocephin (ceftriaxone) as an empirical therapy to cover for potential UTI pathogens. Ciprofloxacin will be stopped, and a new prescription for cefdinir.  A urine culture will be ordered, however if patient's outside lab and urine culture returns, recommend patient share information with PCP or this clinic as it is necessary to adjust the antibiotic therapy based on sensitivity and specificity. Instruct the patient to increase fluid intake to help flush out bacteria from the urinary tract. Monitor symptoms closely, and if there is no improvement or worsening of symptoms, the patient is advised to seek immediate care at the emergency department for possible IV antibiotics and further evaluation.  Use of NSAIDs contraindicated because patient is on anticoagulation, so a low-dose tramadol course will be prescribed to assist with ongoing pain.      Relevant Medications   traMADol (ULTRAM) 50 MG tablet   ondansetron (ZOFRAN) 4 MG tablet   acetaminophen (TYLENOL 8 HOUR) 650 MG CR tablet   cefdinir (OMNICEF) 300 MG capsule   Other Relevant Orders   POCT Urinalysis Dipstick (Completed)   Urine Culture    Medications Discontinued During This Encounter  Medication Reason   ciprofloxacin (CIPRO) 500 MG tablet       Subjective:  HPI: Encounter date: 12/03/2022  Thomas Bradley is a 77 y.o. male who has Dyslipidemia associated  with type 2 diabetes mellitus ([place]); Essential hypertension; GERD; INGUINAL HERNIA, RIGHT; BPH (benign prostatic hyperplasia); Annual physical exam; DJD (degenerative joint disease); Rhinitis; Diabetes mellitus without complication ([place]); PCP NOTES >>>>>>>>>>>>>>>>>; AVN of femur ([place]); Paroxysmal atrial fibrillation (HCC); C. difficile diarrhea; Fatigue; Hypercoagulable state due to paroxysmal atrial fibrillation (HCC); CHA2DS2-VASc score 4 (HTN, DM, age x 2); and Pyelonephritis on their problem list..   He  has a past medical history of Atrial fibrillation ([place]) (12/30/2021), Benign prostatic hypertrophy, BPH (benign prostatic hypertrophy) with urinary obstruction, Elevated PSA, Erectile dysfunction due to arterial insufficiency, Erythema multiforme, GERD (gastroesophageal reflux disease), Hyperlipemia, Hypertension, Nocturia, and Prostatitis..   CHIEF COMPLAINT: The patient presents with pain in the back and history of fever, chills, and difficulty in urination suggestive of a urinary tract infection (UTI).  HISTORY OF PRESENT ILLNESS:  Problem 1: The patient reports the onset of symptoms started on Monday, with initial presentations including extreme cold sensation after yard work, a spike in temperature of 102F, and inability to urinate. The patient had visited an urgent care center yesterday where Ciprofloxacin 500 mg p.o. twice daily was prescribed based on the presence of nitrates in the urine suggestive of a UTI.  A urine culture was also sent off at that time.  Despite three doses of antibiotics, the patient continues to experience significant discomfort with urination, but is no longer having difficulty voiding difficulty.  REVIEW OF SYSTEMS: The patient reports a positive review for vomiting earlier but none on the day of this visit, fever, chills, back pain, and dysuria. The remainder of the systems review is negative.  Past Surgical History:  Procedure Laterality Date   7-day Zio patch   12/2021   100% A-fib.  Heart rate range 55 to 179 bpm.  (Slow rate, controlled VR and RVR) 3 short runs of NSVT (fastest and longest: 9 beats at a rate to 40 bpm).  Occasional PVCs   CARDIOVERSION N/A 02/17/2022   Procedure: CARDIOVERSION;  Surgeon: [name], MD;  Location: [facility] ENDOSCOPY;  Service: Cardiovascular;  Laterality: N/A;   HERNIA REPAIR  07/27/2010   open, R inguinal w/ mesh   NM MYOVIEW LTD  01/30/2022   Normal EF 55 to 60%.  LOW RISK.  No ischemia or infarction.   TOTAL HIP ARTHROPLASTY Left 10/10/2019   Procedure: LEFT TOTAL HIP ARTHROPLASTY ANTERIOR APPROACH;  Surgeon: Frederik Pear, MD;  Location: [facility] ORS;  Service: Orthopedics;  Laterality: Left;   TRANSTHORACIC ECHOCARDIOGRAM  01/01/2022   Normal LV size and function.  EF 55 to 60%.  No RWMA.  With A-fib, unable to assess DF.  Normal atrial sizes.  Normal RV size and function.  Normal PAP and borderline elevated RAP.  (Estimated 8 mmHg)    Outpatient Medications Prior to Visit  Medication Sig Dispense Refill   amLODipine (NORVASC) 5 MG tablet Take 1 tablet (5 mg total) by mouth daily. 90 tablet 3   apixaban (ELIQUIS) 5 MG TABS tablet Take 1 tablet (5 mg total) by mouth 2 (two) times daily. 180 tablet 1   Cholecalciferol (VITAMIN D3) 2000 UNITS TABS Take 1 tablet by mouth daily. Reported on 02/01/2016     Cyanocobalamin (B-12) 2500 MCG TABS Take 1 tablet by mouth daily. Reported on 02/01/2016     doxazosin (CARDURA) 4 MG tablet TAKE 1 TABLET(4 MG) BY MOUTH DAILY 90 tablet 1   metoprolol succinate (TOPROL XL) 25 MG 24 hr tablet Take 1 tablet (25 mg total) by mouth daily. 90 tablet 3   Multiple Vitamin (MULTIVITAMIN) capsule Take 1 capsule by mouth daily. Reported on 02/01/2016     pravastatin (PRAVACHOL) 80 MG tablet Take 1 tablet (80 mg total) by mouth at bedtime. 90 tablet 1   vitamin E 400 UNIT capsule Take 400 mg by mouth daily.     ciprofloxacin (CIPRO) 500 MG tablet Take 500 mg by mouth 2 (two) times daily.     No  facility-administered medications prior to visit.    Family History  Problem Relation Age of Onset   Coronary artery disease Father 33   Hypertension Sister    Diabetes Brother        Poorly controlled   Coronary artery disease Brother        History of CABG   Peripheral Artery Disease Brother    Alcoholism Brother    Congestive Heart Failure Brother    Stroke Neg Hx    Colon cancer Neg Hx    Prostate cancer Neg Hx     Social History   Socioeconomic History   Marital status: Married    Spouse name: Not on file   Number of children: 2   Years of education: Not on file   Highest education level: Not on file  Occupational History   Occupation: retired   Tobacco Use   Smoking status: Former    Types: Cigarettes    Quit date: 11/13/1976    Years since quitting: 46.0   Smokeless tobacco: Never  Vaping Use   Vaping Use: Never used  Substance and Sexual Activity   Alcohol use: No   Drug  use: No   Sexual activity: Never  Other Topics Concern   Not on file  Social History Narrative   Has 3 acres, works in the yard a lot x 6 hours sometimes, feels well.     4 G-kids    Usually very active working on the property.       Social Determinants of Health   Financial Resource Strain: Low Risk  (02/24/2022)   Overall Financial Resource Strain (CARDIA)    Difficulty of Paying Living Expenses: Not hard at all  Food Insecurity: No Food Insecurity (02/24/2022)   Hunger Vital Sign    Worried About Running Out of Food in the Last Year: Never true    Ran Out of Food in the Last Year: Never true  Transportation Needs: No Transportation Needs (02/24/2022)   PRAPARE - Hydrologist (Medical): No    Lack of Transportation (Non-Medical): No  Physical Activity: Sufficiently Active (02/24/2022)   Exercise Vital Sign    Days of Exercise per Week: 7 days    Minutes of Exercise per Session: 30 min  Stress: No Stress Concern Present (02/24/2022)   [place]    Feeling of Stress : Not at all  Social Connections: Moderately Integrated (02/24/2022)   Social Connection and Isolation Panel [NHANES]    Frequency of Communication with Friends and Family: Three times a week    Frequency of Social Gatherings with Friends and Family: More than three times a week    Attends Religious Services: More than 4 times per year    Active Member of [employer] or Organizations: No    Attends Archivist Meetings: Never    Marital Status: Married  Human resources officer Violence: Not At Risk (02/24/2022)   Humiliation, Afraid, Rape, and Kick questionnaire    Fear of Current or Ex-Partner: No    Emotionally Abused: No    Physically Abused: No    Sexually Abused: No                                                                                                 Objective:  Physical Exam: BP 136/84 (BP Location: Right Arm, Patient Position: Sitting, Cuff Size: Large)   Pulse 80   Temp (!) 97 F (36.1 C) (Temporal)   Ht '6\' 2"'$  (1.88 m)   Wt 210 lb 6.4 oz (95.4 kg)   SpO2 97%   BMI 27.01 kg/m    General: The patient appears uncomfortable and has difficulty sitting still, indicating possible mild distress, Awake and conversant.  Eyes: Normal conjunctiva, anicteric. Round symmetric pupils.  ENT: Hearing grossly intact. No nasal discharge.  Neck: Neck is supple. No masses or thyromegaly.  Respiratory: Respirations are non-labored. No auditory wheezing.  Skin: Warm. No rashes or ulcers.  Psych: Alert and oriented. Cooperative, Appropriate mood and affect, Normal judgment.  CV: No cyanosis or JVD GU: Mild flank tenderness bilaterally ABD: Mildly tender diffusely no rebound or guarding MSK: Normal ambulation. No clubbing  Neuro: Sensation and CN II-XII grossly normal.  Results for orders placed or performed in visit on 12/03/22  POCT Urinalysis Dipstick  Result Value Ref Range   Color, UA ORANGE     Clarity, UA CLEAR    Glucose, UA Negative Negative   Bilirubin, UA NEG    Ketones, UA NEG    Spec Grav, UA <=1.005 (A) 1.010 - 1.025   Blood, UA POSITVE    pH, UA 5.0 5.0 - 8.0   Protein, UA Negative Negative   Urobilinogen, UA 0.2 0.2 or 1.0 E.U./dL   Nitrite, UA POSITIVE    Leukocytes, UA Trace (A) Negative   Appearance     Odor          Alesia Banda, MD, MS

## 2022-12-03 NOTE — Assessment & Plan Note (Signed)
Differential diagnosis: - Pyelonephritis - Urinary calculus, unspecified  A pyelonephritis is the most likely diagnosis based on the presence of nitrates in the urine, symptoms of dysuria, fever, and chills. Cystitis is considered given the dysuria and urinary frequency symptoms. A urinary calculus, though possible, appears less likely as the patient's symptoms are suggestive of infection rather than obstruction.  Plan: The patient will be administered a dose of Rocephin (ceftriaxone) as an empirical therapy to cover for potential UTI pathogens. Ciprofloxacin will be stopped, and a new prescription for cefdinir.  A urine culture will be ordered, however if patient's outside lab and urine culture returns, recommend patient share information with PCP or this clinic as it is necessary to adjust the antibiotic therapy based on sensitivity and specificity. Instruct the patient to increase fluid intake to help flush out bacteria from the urinary tract. Monitor symptoms closely, and if there is no improvement or worsening of symptoms, the patient is advised to seek immediate care at the emergency department for possible IV antibiotics and further evaluation.  Use of NSAIDs contraindicated because patient is on anticoagulation, so a low-dose tramadol course will be prescribed to assist with ongoing pain.

## 2022-12-04 LAB — URINE CULTURE
MICRO NUMBER:: 14532529
Result:: NO GROWTH
SPECIMEN QUALITY:: ADEQUATE

## 2022-12-05 ENCOUNTER — Emergency Department (HOSPITAL_BASED_OUTPATIENT_CLINIC_OR_DEPARTMENT_OTHER)
Admission: EM | Admit: 2022-12-05 | Discharge: 2022-12-05 | Disposition: A | Discharge status: Home / Self Care | Payer: Medicare PPO | Attending: Emergency Medicine | Admitting: Emergency Medicine

## 2022-12-05 ENCOUNTER — Other Ambulatory Visit: Payer: Self-pay

## 2022-12-05 ENCOUNTER — Encounter (HOSPITAL_BASED_OUTPATIENT_CLINIC_OR_DEPARTMENT_OTHER): Payer: Self-pay | Admitting: *Deleted

## 2022-12-05 DIAGNOSIS — N3000 Acute cystitis without hematuria: Secondary | ICD-10-CM | POA: Diagnosis not present

## 2022-12-05 DIAGNOSIS — I10 Essential (primary) hypertension: Secondary | ICD-10-CM | POA: Insufficient documentation

## 2022-12-05 DIAGNOSIS — E119 Type 2 diabetes mellitus without complications: Secondary | ICD-10-CM | POA: Insufficient documentation

## 2022-12-05 DIAGNOSIS — Z79899 Other long term (current) drug therapy: Secondary | ICD-10-CM | POA: Diagnosis not present

## 2022-12-05 DIAGNOSIS — Z7901 Long term (current) use of anticoagulants: Secondary | ICD-10-CM | POA: Diagnosis not present

## 2022-12-05 DIAGNOSIS — R300 Dysuria: Secondary | ICD-10-CM | POA: Diagnosis present

## 2022-12-05 LAB — BASIC METABOLIC PANEL
Anion gap: 7 (ref 5–15)
BUN: 19 mg/dL (ref 8–23)
CO2: 26 mmol/L (ref 22–32)
Calcium: 8.6 mg/dL — ABNORMAL LOW (ref 8.9–10.3)
Chloride: 101 mmol/L (ref 98–111)
Creatinine, Ser: 1.12 mg/dL (ref 0.61–1.24)
GFR, Estimated: >60 mL/min (ref >60)
Glucose, Bld: 154 mg/dL — ABNORMAL HIGH (ref 70–99)
Potassium: 4.1 mmol/L (ref 3.5–5.1)
Sodium: 134 mmol/L — ABNORMAL LOW (ref 135–145)

## 2022-12-05 LAB — URINALYSIS, MICROSCOPIC (REFLEX)

## 2022-12-05 LAB — CBC
HCT: 37.8 % — ABNORMAL LOW (ref 39.0–52.0)
Hemoglobin: 12.8 g/dL — ABNORMAL LOW (ref 13.0–17.0)
MCH: 30.8 pg (ref 26.0–34.0)
MCHC: 33.9 g/dL (ref 30.0–36.0)
MCV: 90.9 fL (ref 80.0–100.0)
Platelets: 160 10*3/uL (ref 150–400)
RBC: 4.16 MIL/uL — ABNORMAL LOW (ref 4.22–5.81)
RDW: 13.6 % (ref 11.5–15.5)
WBC: 5.9 10*3/uL (ref 4.0–10.5)
nRBC: 0 % (ref 0.0–0.2)

## 2022-12-05 LAB — URINALYSIS, ROUTINE W REFLEX MICROSCOPIC
Bilirubin Urine: NEGATIVE
Glucose, UA: 100 mg/dL — AB
Ketones, ur: NEGATIVE mg/dL
Nitrite: POSITIVE — AB
Protein, ur: 30 mg/dL — AB
Specific Gravity, Urine: <=1.005 (ref 1.005–1.030)
pH: 5 (ref 5.0–8.0)

## 2022-12-05 MED ORDER — FOSFOMYCIN TROMETHAMINE 3 G PO PACK
3.0000 g | PACK | Freq: Once | ORAL | Status: AC
  Administered 2022-12-05: 3 g via ORAL
  Filled 2022-12-05: qty 3

## 2022-12-05 NOTE — ED Notes (Signed)
Bladder Scan: 400 mL

## 2022-12-05 NOTE — ED Provider Notes (Signed)
[facility] Provider Note   CSN: [id number] Arrival date & time: 12/05/22  1905     History {Add pertinent medical, surgical, social history, OB history to HPI:1} No chief complaint on file.   Thomas Bradley is a 77 y.o. male.  Monday and burning worse Had a fever of 102 on Monday and flank pain that resolved after he got the IM ceftriaxone Burning persisted so he came into the emergency department Also feels that he is not able to empty his bladder all the way No penile discharge, perineal pain, pain in his testicles       Home Medications Prior to Admission medications   Medication Sig Start Date End Date Taking? Authorizing Provider  acetaminophen (TYLENOL 8 HOUR) 650 MG CR tablet Take 1 tablet (650 mg total) by mouth every 8 (eight) hours as needed for pain or fever. 12/03/22   Bonnita Hollow, MD  amLODipine (NORVASC) 5 MG tablet Take 1 tablet (5 mg total) by mouth daily. 10/01/22   Leonie Man, MD  apixaban (ELIQUIS) 5 MG TABS tablet Take 1 tablet (5 mg total) by mouth 2 (two) times daily. 11/04/22   Colon Branch, MD  cefdinir (OMNICEF) 300 MG capsule Take 1 capsule (300 mg total) by mouth 2 (two) times daily for 7 days. 12/03/22 12/10/22  Bonnita Hollow, MD  Cholecalciferol (VITAMIN D3) 2000 UNITS TABS Take 1 tablet by mouth daily. Reported on 02/01/2016    [provider]  Cyanocobalamin (B-12) 2500 MCG TABS Take 1 tablet by mouth daily. Reported on 02/01/2016    [provider]  doxazosin (CARDURA) 4 MG tablet TAKE 1 TABLET(4 MG) BY MOUTH DAILY 12/01/22   Colon Branch, MD  metoprolol succinate (TOPROL XL) 25 MG 24 hr tablet Take 1 tablet (25 mg total) by mouth daily. 01/27/22   Leonie Man, MD  Multiple Vitamin (MULTIVITAMIN) capsule Take 1 capsule by mouth daily. Reported on 02/01/2016    [provider]  ondansetron (ZOFRAN) 4 MG tablet Take 1 tablet (4 mg total) by mouth every 8 (eight) hours as needed  for nausea or vomiting. 12/03/22   Bonnita Hollow, MD  pravastatin (PRAVACHOL) 80 MG tablet Take 1 tablet (80 mg total) by mouth at bedtime. 10/28/22   Colon Branch, MD  traMADol (ULTRAM) 50 MG tablet Take 1 tablet (50 mg total) by mouth every 8 (eight) hours as needed for up to 5 days. 12/03/22 12/08/22  Bonnita Hollow, MD  vitamin E 400 UNIT capsule Take 400 mg by mouth daily.    [provider]      Allergies    Patient has no known allergies.    Review of Systems   Review of Systems  Physical Exam Updated Vital Signs BP 118/65 (BP Location: Right Arm)   Pulse 80   Temp 98.8 F (37.1 C) (Oral)   Resp 16   Ht 6' 1.5" (1.867 m)   Wt 95.3 kg   SpO2 98%   BMI 27.33 kg/m  Physical Exam  ED Results / Procedures / Treatments   Labs (all labs ordered are listed, but only abnormal results are displayed) Labs Reviewed  URINALYSIS, ROUTINE W REFLEX MICROSCOPIC - Abnormal; Notable for the following components:      Result Value   Color, Urine ORANGE (*)    Glucose, UA 100 (*)    Hgb urine dipstick TRACE (*)    Protein, ur 30 (*)  Nitrite POSITIVE (*)    Leukocytes,Ua LARGE (*)    All other components within normal limits  URINALYSIS, MICROSCOPIC (REFLEX) - Abnormal; Notable for the following components:   Bacteria, UA RARE (*)    All other components within normal limits  CBC - Abnormal; Notable for the following components:   RBC 4.16 (*)    Hemoglobin 12.8 (*)    HCT 37.8 (*)    All other components within normal limits  BASIC METABOLIC PANEL - Abnormal; Notable for the following components:   Sodium 134 (*)    Glucose, Bld 154 (*)    Calcium 8.6 (*)    All other components within normal limits    EKG None  Radiology No results found.  Procedures Procedures  {Document cardiac monitor, telemetry assessment procedure when appropriate:1}  Medications Ordered in ED Medications - No data to display  ED Course/ Medical Decision Making/ A&P   {   Click  here for ABCD2, HEART and other calculatorsREFRESH Note before signing :1}                          Medical Decision Making Amount and/or Complexity of Data Reviewed Labs: ordered.   ***  {Document critical care time when appropriate:1} {Document review of labs and clinical decision tools ie heart score, Chads2Vasc2 etc:1}  {Document your independent review of radiology images, and any outside records:1} {Document your discussion with family members, caretakers, and with consultants:1} {Document social determinants of health affecting pt's care:1} {Document your decision making why or why not admission, treatments were needed:1} Final Clinical Impression(s) / ED Diagnoses Final diagnoses:  None    Rx / DC Orders ED Discharge Orders     None

## 2022-12-05 NOTE — ED Triage Notes (Signed)
Patient is here with his wife.  He reports he had onset of fever of 102 on Monday with chills.  Patient was seen at [facility] on Tuesday.  He was started on Cipro for UTI.  Patient was seen again on Wed, he was experiencing back pain and given a shot in the office and antibiotic was changed to cefdinir.  Patient is now having urinary burning and frequency.  Patient has tried AZO without relief.  No fevers.  He does feel cold and has chills.  Patient is alert.  HR is 84, bp is 118/65.  Patient denies any back pain.  Patient is here due to the ongoing sx and need for possible IV.  Patient denies nausea.  He is experiencing dizziness as well.

## 2022-12-05 NOTE — Discharge Instructions (Signed)
You were seen for your urinary tract infection in the emergency department.   At home, please continue the antibiotics that you were prescribed.    Check your MyChart online for the results of any tests that had not resulted by the time you left the emergency department.   Follow-up with your primary doctor in 2-3 days regarding your visit.  Follow-up with urology as scheduled.  Return immediately to the emergency department if you experience any of the following: Fevers, flank pains, or any other concerning symptoms.    Thank you for visiting our Emergency Department. It was a pleasure taking care of you today.

## 2022-12-06 ENCOUNTER — Other Ambulatory Visit: Payer: Self-pay | Admitting: Family

## 2022-12-07 LAB — URINE CULTURE: Culture: NO GROWTH

## 2022-12-09 DIAGNOSIS — N401 Enlarged prostate with lower urinary tract symptoms: Secondary | ICD-10-CM | POA: Diagnosis not present

## 2022-12-09 DIAGNOSIS — R351 Nocturia: Secondary | ICD-10-CM | POA: Diagnosis not present

## 2022-12-10 ENCOUNTER — Other Ambulatory Visit: Payer: Self-pay

## 2022-12-10 MED ORDER — APIXABAN 5 MG PO TABS: 5.0000 mg | ORAL_TABLET | Freq: Two times a day (BID) | ORAL | 1 refills | Status: DC

## 2023-01-09 ENCOUNTER — Other Ambulatory Visit: Payer: Self-pay | Admitting: Cardiology

## 2023-02-03 ENCOUNTER — Encounter: Payer: Self-pay | Admitting: Internal Medicine

## 2023-02-17 DIAGNOSIS — H35033 Hypertensive retinopathy, bilateral: Secondary | ICD-10-CM | POA: Diagnosis not present

## 2023-02-17 LAB — HM DIABETES EYE EXAM

## 2023-03-03 ENCOUNTER — Ambulatory Visit: Payer: Medicare PPO | Admitting: Internal Medicine

## 2023-03-03 ENCOUNTER — Encounter: Payer: Self-pay | Admitting: Internal Medicine

## 2023-03-03 VITALS — BP 122/64 | HR 88 | Temp 97.9°F | Resp 16 | Ht 74.0 in | Wt 202.4 lb

## 2023-03-03 DIAGNOSIS — I10 Essential (primary) hypertension: Secondary | ICD-10-CM

## 2023-03-03 DIAGNOSIS — E119 Type 2 diabetes mellitus without complications: Secondary | ICD-10-CM

## 2023-03-03 DIAGNOSIS — D649 Anemia, unspecified: Secondary | ICD-10-CM | POA: Diagnosis not present

## 2023-03-03 DIAGNOSIS — N400 Enlarged prostate without lower urinary tract symptoms: Secondary | ICD-10-CM | POA: Diagnosis not present

## 2023-03-03 LAB — CBC WITH DIFFERENTIAL/PLATELET
Basophils Absolute: 0 10*3/uL (ref 0.0–0.1)
Basophils Relative: 0.5 % (ref 0.0–3.0)
Eosinophils Absolute: 0.1 10*3/uL (ref 0.0–0.7)
Eosinophils Relative: 3.1 % (ref 0.0–5.0)
HCT: 42.1 % (ref 39.0–52.0)
Hemoglobin: 14.3 g/dL (ref 13.0–17.0)
Lymphocytes Relative: 26.7 % (ref 12.0–46.0)
Lymphs Abs: 1.2 10*3/uL (ref 0.7–4.0)
MCHC: 33.8 g/dL (ref 30.0–36.0)
MCV: 92.3 fl (ref 78.0–100.0)
Monocytes Absolute: 0.5 10*3/uL (ref 0.1–1.0)
Monocytes Relative: 11.4 % (ref 3.0–12.0)
Neutro Abs: 2.6 10*3/uL (ref 1.4–7.7)
Neutrophils Relative %: 58.3 % (ref 43.0–77.0)
Platelets: 151 10*3/uL (ref 150.0–400.0)
RBC: 4.56 Mil/uL (ref 4.22–5.81)
RDW: 15.2 % (ref 11.5–15.5)
WBC: 4.4 10*3/uL (ref 4.0–10.5)

## 2023-03-03 LAB — MICROALBUMIN / CREATININE URINE RATIO
Creatinine,U: 185.7 mg/dL
Microalb Creat Ratio: 0.6 mg/g (ref 0.0–30.0)
Microalb, Ur: 1.1 mg/dL (ref 0.0–1.9)

## 2023-03-03 LAB — HEMOGLOBIN A1C: Hgb A1c MFr Bld: 6.4 % (ref 4.6–6.5)

## 2023-03-03 NOTE — Progress Notes (Signed)
Subjective:    Patient ID: Thomas Bradley, male    DOB: 1946-01-15, 77 y.o.   MRN: [id number]  DOS:  03/03/2023 Type of visit - description: f/u  Evaluated 11-2022  @ ERwith LUTS (dysuria, fever), UA showed trace leukocyte, culture negative.  Was Rx oral antibiotics. Subsequently went to the ER: UA was more impressive at this time which large leukocytes, creatinine stable, urine culture no growth (already on antibiotics) Rx  monurol Subsequently saw urology, for LUTS and BPH was recommended to continue doxazosin, declined more aggressive intervention.  They recommended to continue prostate cancer screening after age 66.  Currently with no major LUTS.  History of A-fib, anticoagulated.  Denies chest pain or difficulty breathing.  No blood in the stool or in the urine.  No GI symptoms.   Review of Systems See above   Past Medical History:  Diagnosis Date   Atrial fibrillation (HCC) 12/30/2021   7-day Zio patch monitor: 100% A-fib   Benign prostatic hypertrophy    ED, sees urology every year   BPH (benign prostatic hypertrophy) with urinary obstruction    Dr. Isabel Caprice   Elevated PSA    Dr. Isabel Caprice   Erectile dysfunction due to arterial insufficiency    Dr. Isabel Caprice   Erythema multiforme    dx 2013    GERD (gastroesophageal reflux disease)    Hyperlipemia    Hypertension    Nocturia    Prostatitis    2010, again 09-2011    Past Surgical History:  Procedure Laterality Date   7-day Zio patch  12/2021   100% A-fib.  Heart rate range 55 to 179 bpm.  (Slow rate, controlled VR and RVR) 3 short runs of NSVT (fastest and longest: 9 beats at a rate to 40 bpm).  Occasional PVCs   CARDIOVERSION N/A 02/17/2022   Procedure: CARDIOVERSION;  Surgeon: Meriam Sprague, MD;  Location: [facility] ENDOSCOPY;  Service: Cardiovascular;  Laterality: N/A;   HERNIA REPAIR  07/27/2010   open, R inguinal w/ mesh   NM MYOVIEW LTD  01/30/2022   Normal EF 55 to 60%.  LOW RISK.  No ischemia or infarction.    TOTAL HIP ARTHROPLASTY Left 10/10/2019   Procedure: LEFT TOTAL HIP ARTHROPLASTY ANTERIOR APPROACH;  Surgeon: Gean Birchwood, MD;  Location: [facility] ORS;  Service: Orthopedics;  Laterality: Left;   TRANSTHORACIC ECHOCARDIOGRAM  01/01/2022   Normal LV size and function.  EF 55 to 60%.  No RWMA.  With A-fib, unable to assess DF.  Normal atrial sizes.  Normal RV size and function.  Normal PAP and borderline elevated RAP.  (Estimated 8 mmHg)    Current Outpatient Medications  Medication Instructions   amLODipine (NORVASC) 5 mg, Oral, Daily   apixaban (ELIQUIS) 5 mg, Oral, 2 times daily   Cholecalciferol (VITAMIN D3) 2000 UNITS TABS 1 tablet, Oral, Daily, Reported on 02/01/2016   Cyanocobalamin (B-12) 2500 MCG TABS 1 tablet, Oral, Daily, Reported on 02/01/2016   doxazosin (CARDURA) 4 MG tablet TAKE 1 TABLET(4 MG) BY MOUTH DAILY   metoprolol succinate (TOPROL-XL) 25 MG 24 hr tablet TAKE 1 TABLET(25 MG) BY MOUTH DAILY   Multiple Vitamin (MULTIVITAMIN) capsule 1 capsule, Oral, Daily, Reported on 02/01/2016   pravastatin (PRAVACHOL) 80 mg, Oral, Daily at bedtime   vitamin E 400 mg, Oral, Daily       Objective:   Physical Exam BP 122/64   Pulse 88   Temp 97.9 F (36.6 C) (Oral)   Resp 16   Ht  6\' 2"  (1.88 m)   Wt 202 lb 6 oz (91.8 kg)   SpO2 98%   BMI 25.98 kg/m  General:   Well developed, NAD, BMI noted. HEENT:  Normocephalic . Face symmetric, atraumatic Lungs:  CTA B Normal respiratory effort, no intercostal retractions, no accessory muscle use. Heart: RRR,  no murmur.  Lower extremities: no pretibial edema bilaterally  Skin: Not pale. Not jaundice Neurologic:  alert & oriented X3.  Speech normal, gait appropriate for age and unassisted Psych--  Cognition and judgment appear intact.  Cooperative with normal attention span and concentration.  Behavior appropriate. No anxious or depressed appearing.      Assessment     Assessment DM HTN (intol to losartan,  2018) Hyperlipidemia GERD Erythema multiforme 2013 GU: --BPH w/ elevated PSA, asymmetric prostate --Prostatitis 2010, 2012 --ED Has a epi-pen -- reason? Pt not sure, had an allergy to a medicine, name? A-fib: New onset 12-2021.  Stress test - April 2023.  Anticoagulated  PLAN:    DM: Diet controlled, trying to do the best with his diet, very active doing heavy yard work.  Check A1c. Atrial fibrillation Saw cardiology December 2023, felt to be stable, on Eliquis and beta-blockers.  He is very active doing yard work, Field seismologist.  Caution recommended, he is at risk of bleeding. Anemia: Mild, noted at the ER, no GI symptoms.  Check CBC. BPH: Recently had LUTS, had antibiotics, chart reviewed, symptoms eventually went away. RTC 5 to 6 months

## 2023-03-03 NOTE — Assessment & Plan Note (Signed)
DM: Diet controlled, trying to do the best with his diet, very active doing heavy yard work.  Check A1c. Atrial fibrillation Saw cardiology December 2023, felt to be stable, on Eliquis and beta-blockers.  He is very active doing yard work, Field seismologist.  Caution recommended, he is at risk of bleeding. Anemia: Mild, noted at the ER, no GI symptoms.  Check CBC. BPH: Recently had LUTS, had antibiotics, chart reviewed, symptoms eventually went away. RTC 5 to 6 months

## 2023-03-03 NOTE — Patient Instructions (Addendum)
    GO TO THE LAB : Get the blood work     GO TO THE FRONT DESK, PLEASE SCHEDULE YOUR APPOINTMENTS Come back for checkup in 5 to 6 months   Per our records you are due for your diabetic eye exam. Please contact your eye doctor to schedule an appointment. Please have them send copies of your office visit notes to [place]. Our fax number is [phone]. If you need a referral to an eye doctor please let us know.  Please bring [place] a copy of your Healthcare Power of Attorney for your chart.

## 2023-03-04 ENCOUNTER — Encounter: Payer: Self-pay | Admitting: Internal Medicine

## 2023-03-05 ENCOUNTER — Ambulatory Visit (INDEPENDENT_AMBULATORY_CARE_PROVIDER_SITE_OTHER): Payer: Medicare PPO | Admitting: *Deleted

## 2023-03-05 DIAGNOSIS — Z0000 Encounter for general adult medical examination without abnormal findings: Secondary | ICD-10-CM

## 2023-03-05 NOTE — Progress Notes (Signed)
Subjective:   Thomas Bradley is a 77 y.o. male who presents for Medicare Annual/Subsequent preventive examination.  I connected with  [name] on 03/05/23 by a audio enabled telemedicine application and verified that I am speaking with the correct person using two identifiers.  Patient Location: Home  Provider Location: Office/Clinic  I discussed the limitations of evaluation and management by telemedicine. The patient expressed understanding and agreed to proceed.   Review of Systems     Cardiac Risk Factors include: advanced age (>8men, >83 women);male gender;diabetes mellitus;dyslipidemia;hypertension     Objective:    There were no vitals filed for this visit. There is no height or weight on file to calculate BMI.     03/05/2023   10:29 AM 12/05/2022    7:25 PM 02/24/2022   11:06 AM 02/17/2022    9:12 AM 01/02/2020    9:33 AM 10/07/2019   11:40 AM 12/28/2018    9:16 AM  Advanced Directives  Does Patient Have a Medical Advance Directive? Yes Yes No No No No No  Type of Advance Directive Living will        Would patient like information on creating a medical advance directive?    No - Patient declined No - Patient declined No - Patient declined No - Patient declined    Current Medications (verified) Outpatient Encounter Medications as of 03/05/2023  Medication Sig   amLODipine (NORVASC) 5 MG tablet Take 1 tablet (5 mg total) by mouth daily.   apixaban (ELIQUIS) 5 MG TABS tablet Take 1 tablet (5 mg total) by mouth 2 (two) times daily.   Cholecalciferol (VITAMIN D3) 2000 UNITS TABS Take 1 tablet by mouth daily. Reported on 02/01/2016   doxazosin (CARDURA) 4 MG tablet TAKE 1 TABLET(4 MG) BY MOUTH DAILY   metoprolol succinate (TOPROL-XL) 25 MG 24 hr tablet TAKE 1 TABLET(25 MG) BY MOUTH DAILY   Multiple Vitamin (MULTIVITAMIN) capsule Take 1 capsule by mouth daily. Reported on 02/01/2016   pravastatin (PRAVACHOL) 80 MG tablet Take 1 tablet (80 mg total) by mouth at bedtime.    vitamin E 400 UNIT capsule Take 400 mg by mouth daily.   [DISCONTINUED] Cyanocobalamin (B-12) 2500 MCG TABS Take 1 tablet by mouth daily. Reported on 02/01/2016   No facility-administered encounter medications on file as of 03/05/2023.    Allergies (verified) Oxycodone hcl   History: Past Medical History:  Diagnosis Date   Arthritis    Atrial fibrillation (HCC) 12/30/2021   7-day Zio patch monitor: 100% A-fib   Benign prostatic hypertrophy    ED, sees urology every year   BPH (benign prostatic hypertrophy) with urinary obstruction    Dr. Isabel Caprice   Elevated PSA    Dr. Isabel Caprice   Erectile dysfunction due to arterial insufficiency    Dr. Isabel Caprice   Erythema multiforme    dx 2013    GERD (gastroesophageal reflux disease)    Heart murmur    Hyperlipemia    Hypertension    Nocturia    Prostatitis    2010, again 09-2011   Past Surgical History:  Procedure Laterality Date   7-day Zio patch  12/2021   100% A-fib.  Heart rate range 55 to 179 bpm.  (Slow rate, controlled VR and RVR) 3 short runs of NSVT (fastest and longest: 9 beats at a rate to 40 bpm).  Occasional PVCs   CARDIOVERSION N/A 02/17/2022   Procedure: CARDIOVERSION;  Surgeon: Meriam Sprague, MD;  Location: [facility] ENDOSCOPY;  Service:  Cardiovascular;  Laterality: N/A;   HERNIA REPAIR  07/27/2010   open, R inguinal w/ mesh   JOINT REPLACEMENT     NM MYOVIEW LTD  01/30/2022   Normal EF 55 to 60%.  LOW RISK.  No ischemia or infarction.   TOTAL HIP ARTHROPLASTY Left 10/10/2019   Procedure: LEFT TOTAL HIP ARTHROPLASTY ANTERIOR APPROACH;  Surgeon: Gean Birchwood, MD;  Location: [facility] ORS;  Service: Orthopedics;  Laterality: Left;   TRANSTHORACIC ECHOCARDIOGRAM  01/01/2022   Normal LV size and function.  EF 55 to 60%.  No RWMA.  With A-fib, unable to assess DF.  Normal atrial sizes.  Normal RV size and function.  Normal PAP and borderline elevated RAP.  (Estimated 8 mmHg)   Family History  Problem Relation Age of Onset   Early death  Mother    Coronary artery disease Father 80   Early death Father    Hypertension Sister    Diabetes Brother        Poorly controlled   Coronary artery disease Brother        History of CABG   Peripheral Artery Disease Brother    Alcoholism Brother    Congestive Heart Failure Brother    Early death Sister    Stroke Neg Hx    Colon cancer Neg Hx    Prostate cancer Neg Hx    Social History   Socioeconomic History   Marital status: Married    Spouse name: Not on file   Number of children: 2   Years of education: Not on file   Highest education level: 12th grade  Occupational History   Occupation: retired   Tobacco Use   Smoking status: Former    Types: Cigarettes    Quit date: 11/13/1976    Years since quitting: 46.3   Smokeless tobacco: Never  Vaping Use   Vaping Use: Never used  Substance and Sexual Activity   Alcohol use: No   Drug use: No   Sexual activity: Never  Other Topics Concern   Not on file  Social History Narrative   Has 3 acres, works in the yard a lot x 6 hours sometimes, feels well.     4 G-kids    Usually very active working on the property.       Social Determinants of Health   Financial Resource Strain: Low Risk  (02/24/2023)   Overall Financial Resource Strain (CARDIA)    Difficulty of Paying Living Expenses: Not hard at all  Food Insecurity: No Food Insecurity (02/24/2023)   Hunger Vital Sign    Worried About Running Out of Food in the Last Year: Never true    Ran Out of Food in the Last Year: Never true  Transportation Needs: No Transportation Needs (02/24/2023)   PRAPARE - Administrator, Civil Service (Medical): No    Lack of Transportation (Non-Medical): No  Physical Activity: Sufficiently Active (02/24/2023)   Exercise Vital Sign    Days of Exercise per Week: 3 days    Minutes of Exercise per Session: 60 min  Stress: No Stress Concern Present (02/24/2023)   [employer] of Occupational Health - Occupational Stress  Questionnaire    Feeling of Stress : Not at all  Social Connections: Socially Integrated (02/24/2023)   Social Connection and Isolation Panel [NHANES]    Frequency of Communication with Friends and Family: More than three times a week    Frequency of Social Gatherings with Friends and Family: Once a week  Attends Religious Services: More than 4 times per year    Active Member of Clubs or Organizations: Yes    Attends Banker Meetings: More than 4 times per year    Marital Status: Married    Tobacco Counseling Counseling given: Not Answered   Clinical Intake:  Pre-visit preparation completed: Yes  Pain : No/denies pain  BMI - recorded: 25.98 Nutritional Status: BMI 25 -29 Overweight Nutritional Risks: None Diabetes: Yes CBG done?: No Did pt. bring in CBG monitor from home?: No  How often do you need to have someone help you when you read instructions, pamphlets, or other written materials from your doctor or pharmacy?: 1 - Never   Activities of Daily Living    03/05/2023   10:33 AM  In your present state of health, do you have any difficulty performing the following activities:  Hearing? 1  Comment slight hearing loss  Vision? 0  Difficulty concentrating or making decisions? 0  Walking or climbing stairs? 0  Dressing or bathing? 0  Doing errands, shopping? 0  Preparing Food and eating ? N  Using the Toilet? N  In the past six months, have you accidently leaked urine? N  Do you have problems with loss of bowel control? N  Managing your Medications? N  Managing your Finances? N  Housekeeping or managing your Housekeeping? N    Patient Care Team: Wanda Plump, MD as PCP - General Herbie [place] Piedad Climes, MD as PCP - Cardiology (Cardiology) Gean Birchwood, MD as Consulting Physician (Orthopedic Surgery) Charlott Rakes, MD as Consulting Physician (Gastroenterology) Marlene Bast [facility])  Indicate any recent Medical Services you may have received  from other than [facility] providers in the past year (date may be approximate).     Assessment:   This is a routine wellness examination for [place].  Hearing/Vision screen No results found.  Dietary issues and exercise activities discussed: Current Exercise Habits: The patient does not participate in regular exercise at present (Does a lot of yard work), Exercise limited by: None identified   Goals Addressed   None    Depression Screen    03/05/2023   10:33 AM 03/03/2023   10:10 AM 09/02/2022   10:16 AM 03/06/2022    8:54 AM 02/24/2022   11:10 AM 01/14/2022    1:37 PM 12/30/2021    8:51 AM  PHQ 2/9 Scores  PHQ - 2 Score 0 0 0 0 0 0 0    Fall Risk    03/05/2023   10:31 AM 03/03/2023   10:10 AM 09/02/2022   10:16 AM 03/06/2022    8:54 AM 02/24/2022   11:08 AM  Fall Risk   Falls in the past year? 0 0 0 0 0  Number falls in past yr: 0 0 0 0 0  Injury with Fall? 0 0 0 0 0  Risk for fall due to : No Fall Risks      Follow up Falls evaluation completed Falls evaluation completed Falls evaluation completed  Falls prevention discussed    FALL RISK PREVENTION PERTAINING TO THE HOME:  Any stairs in or around the home? Yes  If so, are there any without handrails? Yes  Home free of loose throw rugs in walkways, pet beds, electrical cords, etc? Yes  Adequate lighting in your home to reduce risk of falls? Yes   ASSISTIVE DEVICES UTILIZED TO PREVENT FALLS:  Life alert? No  Use of a cane, walker or w/c? No  Grab bars in  the bathroom? No  Shower chair or bench in shower?  Built in seat Elevated toilet seat or a handicapped toilet? No   TIMED UP AND GO:  Was the test performed?  No, audio visit .    Cognitive Function:    11/09/2017    8:50 AM  MMSE - Mini Mental State Exam  Orientation to time 5  Orientation to Place 5  Registration 3  Attention/ Calculation 5  Recall 2  Language- name 2 objects 2  Language- repeat 1  Language- follow 3 step command 3  Language- read & follow  direction 1  Write a sentence 1  Copy design 1  Total score 29        03/05/2023   10:40 AM  6CIT Screen  What Year? 0 points  What month? 0 points  What time? 0 points  Count back from 20 0 points  Months in reverse 0 points  Repeat phrase 0 points  Total Score 0 points    Immunizations Immunization History  Administered Date(s) Administered   Fluad Quad(high Dose 65+) 07/11/2019   Influenza Split 08/27/2022   Influenza, High Dose Seasonal PF 07/27/2013, 08/16/2018   Influenza,inj,Quad PF,6+ Mos 08/02/2015   Influenza-Unspecified 08/14/2016, 07/27/2017, 08/21/2021   PFIZER(Purple Top)SARS-COV-2 Vaccination 02/09/2020, 03/05/2020, 09/05/2020   Pneumococcal Conjugate-13 04/27/2014   Pneumococcal Polysaccharide-23 01/23/2009, 02/26/2021   Tdap 01/28/2011, 07/25/2015   Zoster Recombinat (Shingrix) 11/19/2021, 01/17/2022   Zoster, Live 07/22/2010    TDAP status: Up to date  Flu Vaccine status: Up to date  Pneumococcal vaccine status: Up to date  Covid-19 vaccine status: Declined, Education has been provided regarding the importance of this vaccine but patient still declined. Advised may receive this vaccine at local pharmacy or Health Dept.or vaccine clinic. Aware to provide a copy of the vaccination record if obtained from local pharmacy or Health Dept. Verbalized acceptance and understanding.  Qualifies for Shingles Vaccine? Yes   Zostavax completed Yes   Shingrix Completed?: Yes  Screening Tests Health Maintenance  Topic Date Due   Medicare Annual Wellness (AWV)  02/25/2023   FOOT EXAM  04/03/2023   INFLUENZA VACCINE  05/28/2023   HEMOGLOBIN A1C  09/03/2023   Diabetic kidney evaluation - eGFR measurement  12/06/2023   OPHTHALMOLOGY EXAM  02/17/2024   Diabetic kidney evaluation - Urine ACR  03/02/2024   DTaP/Tdap/Td (3 - Td or Tdap) 07/24/2025   COLONOSCOPY (Pts 45-28yrs Insurance coverage will need to be confirmed)  12/11/2025   Pneumonia Vaccine 25+ Years old   Completed   Hepatitis C Screening  Completed   Zoster Vaccines- Shingrix  Completed   HPV VACCINES  Aged Out   COVID-19 Vaccine  Discontinued    Health Maintenance  Health Maintenance Due  Topic Date Due   Medicare Annual Wellness (AWV)  02/25/2023    Colorectal cancer screening: Type of screening: Colonoscopy. Completed 12/11/20. Repeat every 5 years  Lung Cancer Screening: (Low Dose CT Chest recommended if Age 31-80 years, 30 pack-year currently smoking OR have quit w/in 15years.) does not qualify.    Additional Screening:  Hepatitis C Screening: does qualify; Completed 11/09/17  Vision Screening: Recommended annual ophthalmology exams for early detection of glaucoma and other disorders of the eye. Is the patient up to date with their annual eye exam?  Yes  Who is the provider or what is the name of the office in which the patient attends annual eye exams? Dr. Lissa Morales If pt is not established with a provider, would they  like to be referred to a provider to establish care? No .   Dental Screening: Recommended annual dental exams for proper oral hygiene  Community Resource Referral / Chronic Care Management: CRR required this visit?  No   CCM required this visit?  No      Plan:     I have personally reviewed and noted the following in the patient's chart:   Medical and social history Use of alcohol, tobacco or illicit drugs  Current medications and supplements including opioid prescriptions. Patient is not currently taking opioid prescriptions. Functional ability and status Nutritional status Physical activity Advanced directives List of other physicians Hospitalizations, surgeries, and ER visits in previous 12 months Vitals Screenings to include cognitive, depression, and falls Referrals and appointments  In addition, I have reviewed and discussed with patient certain preventive protocols, quality metrics, and best practice recommendations. A written personalized  care plan for preventive services as well as general preventive health recommendations were provided to patient.   Due to this being a telephonic visit, the after visit summary with patients personalized plan was offered to patient via mail or my-chart. Patient would like to access on my-chart.  Donne Anon, [place]   03/05/2023   Nurse Notes: None

## 2023-03-05 NOTE — Patient Instructions (Signed)
[name] , Thank you for taking time to come for your Medicare Wellness Visit. I appreciate your ongoing commitment to your health goals. Please review the following plan we discussed and let me know if I can assist you in the future.      This is a list of the screening recommended for you and due dates:  Health Maintenance  Topic Date Due   Complete foot exam   04/03/2023   Flu Shot  05/28/2023   Hemoglobin A1C  09/03/2023   Yearly kidney function blood test for diabetes  12/06/2023   Eye exam for diabetics  02/17/2024   Yearly kidney health urinalysis for diabetes  03/02/2024   Medicare Annual Wellness Visit  03/04/2024   DTaP/Tdap/Td vaccine (3 - Td or Tdap) 07/24/2025   Colon Cancer Screening  12/11/2025   Pneumonia Vaccine  Completed   Hepatitis C Screening: USPSTF Recommendation to screen - Ages 27-79 yo.  Completed   Zoster (Shingles) Vaccine  Completed   HPV Vaccine  Aged Out   COVID-19 Vaccine  Discontinued    Next appointment: Follow up in one year for your annual wellness visit.   Preventive Care 75 Years and Older, Male Preventive care refers to lifestyle choices and visits with your health care provider that can promote health and wellness. What does preventive care include? A yearly physical exam. This is also called an annual well check. Dental exams once or twice a year. Routine eye exams. Ask your health care provider how often you should have your eyes checked. Personal lifestyle choices, including: Daily care of your teeth and gums. Regular physical activity. Eating a healthy diet. Avoiding tobacco and drug use. Limiting alcohol use. Practicing safe sex. Taking low doses of aspirin every day. Taking vitamin and mineral supplements as recommended by your health care provider. What happens during an annual well check? The services and screenings done by your health care provider during your annual well check will depend on your age, overall health,  lifestyle risk factors, and family history of disease. Counseling  Your health care provider may ask you questions about your: Alcohol use. Tobacco use. Drug use. Emotional well-being. Home and relationship well-being. Sexual activity. Eating habits. History of falls. Memory and ability to understand (cognition). Work and work Astronomer. Screening  You may have the following tests or measurements: Height, weight, and BMI. Blood pressure. Lipid and cholesterol levels. These may be checked every 5 years, or more frequently if you are over 50 years old. Skin check. Lung cancer screening. You may have this screening every year starting at age 47 if you have a 30-pack-year history of smoking and currently smoke or have quit within the past 15 years. Fecal occult blood test (FOBT) of the stool. You may have this test every year starting at age 49. Flexible sigmoidoscopy or colonoscopy. You may have a sigmoidoscopy every 5 years or a colonoscopy every 10 years starting at age 28. Prostate cancer screening. Recommendations will vary depending on your family history and other risks. Hepatitis C blood test. Hepatitis B blood test. Sexually transmitted disease (STD) testing. Diabetes screening. This is done by checking your blood sugar (glucose) after you have not eaten for a while (fasting). You may have this done every 1-3 years. Abdominal aortic aneurysm (AAA) screening. You may need this if you are a current or former smoker. Osteoporosis. You may be screened starting at age 37 if you are at high risk. Talk with your health care provider about  your test results, treatment options, and if necessary, the need for more tests. Vaccines  Your health care provider may recommend certain vaccines, such as: Influenza vaccine. This is recommended every year. Tetanus, diphtheria, and acellular pertussis (Tdap, Td) vaccine. You may need a Td booster every 10 years. Zoster vaccine. You may need this  after age 35. Pneumococcal 13-valent conjugate (PCV13) vaccine. One dose is recommended after age 25. Pneumococcal polysaccharide (PPSV23) vaccine. One dose is recommended after age 64. Talk to your health care provider about which screenings and vaccines you need and how often you need them. This information is not intended to replace advice given to you by your health care provider. Make sure you discuss any questions you have with your health care provider. Document Released: 11/09/2015 Document Revised: 07/02/2016 Document Reviewed: 08/14/2015 Elsevier Interactive Patient Education  2017 [employer] Prevention in the Home Falls can cause injuries. They can happen to people of all ages. There are many things you can do to make your home safe and to help prevent falls. What can I do on the outside of my home? Regularly fix the edges of walkways and driveways and fix any cracks. Remove anything that might make you trip as you walk through a door, such as a raised step or threshold. Trim any bushes or trees on the path to your home. Use bright outdoor lighting. Clear any walking paths of anything that might make someone trip, such as rocks or tools. Regularly check to see if handrails are loose or broken. Make sure that both sides of any steps have handrails. Any raised decks and porches should have guardrails on the edges. Have any leaves, snow, or ice cleared regularly. Use sand or salt on walking paths during winter. Clean up any spills in your garage right away. This includes oil or grease spills. What can I do in the bathroom? Use night lights. Install grab bars by the toilet and in the tub and shower. Do not use towel bars as grab bars. Use non-skid mats or decals in the tub or shower. If you need to sit down in the shower, use a plastic, non-slip stool. Keep the floor dry. Clean up any water that spills on the floor as soon as it happens. Remove soap buildup in the tub or  shower regularly. Attach bath mats securely with double-sided non-slip rug tape. Do not have throw rugs and other things on the floor that can make you trip. What can I do in the bedroom? Use night lights. Make sure that you have a light by your bed that is easy to reach. Do not use any sheets or blankets that are too big for your bed. They should not hang down onto the floor. Have a firm chair that has side arms. You can use this for support while you get dressed. Do not have throw rugs and other things on the floor that can make you trip. What can I do in the kitchen? Clean up any spills right away. Avoid walking on wet floors. Keep items that you use a lot in easy-to-reach places. If you need to reach something above you, use a strong step stool that has a grab bar. Keep electrical cords out of the way. Do not use floor polish or wax that makes floors slippery. If you must use wax, use non-skid floor wax. Do not have throw rugs and other things on the floor that can make you trip. What can I do with  my stairs? Do not leave any items on the stairs. Make sure that there are handrails on both sides of the stairs and use them. Fix handrails that are broken or loose. Make sure that handrails are as long as the stairways. Check any carpeting to make sure that it is firmly attached to the stairs. Fix any carpet that is loose or worn. Avoid having throw rugs at the top or bottom of the stairs. If you do have throw rugs, attach them to the floor with carpet tape. Make sure that you have a light switch at the top of the stairs and the bottom of the stairs. If you do not have them, ask someone to add them for you. What else can I do to help prevent falls? Wear shoes that: Do not have high heels. Have rubber bottoms. Are comfortable and fit you well. Are closed at the toe. Do not wear sandals. If you use a stepladder: Make sure that it is fully opened. Do not climb a closed stepladder. Make  sure that both sides of the stepladder are locked into place. Ask someone to hold it for you, if possible. Clearly mark and make sure that you can see: Any grab bars or handrails. First and last steps. Where the edge of each step is. Use tools that help you move around (mobility aids) if they are needed. These include: Canes. Walkers. Scooters. Crutches. Turn on the lights when you go into a dark area. Replace any light bulbs as soon as they burn out. Set up your furniture so you have a clear path. Avoid moving your furniture around. If any of your floors are uneven, fix them. If there are any pets around you, be aware of where they are. Review your medicines with your doctor. Some medicines can make you feel dizzy. This can increase your chance of falling. Ask your doctor what other things that you can do to help prevent falls. This information is not intended to replace advice given to you by your health care provider. Make sure you discuss any questions you have with your health care provider. Document Released: 08/09/2009 Document Revised: 03/20/2016 Document Reviewed: 11/17/2014 Elsevier Interactive Patient Education  2017 [employer].

## 2023-04-04 ENCOUNTER — Other Ambulatory Visit: Payer: Self-pay | Admitting: Internal Medicine

## 2023-06-04 DIAGNOSIS — M25512 Pain in left shoulder: Secondary | ICD-10-CM | POA: Diagnosis not present

## 2023-06-22 ENCOUNTER — Other Ambulatory Visit: Payer: Self-pay | Admitting: Internal Medicine

## 2023-07-03 ENCOUNTER — Other Ambulatory Visit: Payer: Self-pay | Admitting: Cardiology

## 2023-08-17 ENCOUNTER — Encounter: Payer: Self-pay | Admitting: Internal Medicine

## 2023-08-17 ENCOUNTER — Ambulatory Visit (INDEPENDENT_AMBULATORY_CARE_PROVIDER_SITE_OTHER): Payer: Medicare PPO | Admitting: Internal Medicine

## 2023-08-17 VITALS — BP 120/76 | HR 68 | Temp 98.1°F | Resp 16 | Ht 74.0 in | Wt 200.2 lb

## 2023-08-17 DIAGNOSIS — E119 Type 2 diabetes mellitus without complications: Secondary | ICD-10-CM

## 2023-08-17 DIAGNOSIS — E785 Hyperlipidemia, unspecified: Secondary | ICD-10-CM

## 2023-08-17 DIAGNOSIS — E1169 Type 2 diabetes mellitus with other specified complication: Secondary | ICD-10-CM

## 2023-08-17 DIAGNOSIS — I10 Essential (primary) hypertension: Secondary | ICD-10-CM

## 2023-08-17 DIAGNOSIS — Z7185 Encounter for immunization safety counseling: Secondary | ICD-10-CM

## 2023-08-17 LAB — LIPID PANEL
Cholesterol: 149 mg/dL (ref 0–200)
HDL: 46.3 mg/dL (ref >39.00)
LDL Cholesterol: 85 mg/dL (ref 0–99)
NonHDL: 102.88
Total CHOL/HDL Ratio: 3
Triglycerides: 90 mg/dL (ref 0.0–149.0)
VLDL: 18 mg/dL (ref 0.0–40.0)

## 2023-08-17 LAB — COMPREHENSIVE METABOLIC PANEL
ALT: 12 U/L (ref 0–53)
AST: 17 U/L (ref 0–37)
Albumin: 4.4 g/dL (ref 3.5–5.2)
Alkaline Phosphatase: 78 U/L (ref 39–117)
BUN: 17 mg/dL (ref 6–23)
CO2: 27 meq/L (ref 19–32)
Calcium: 9.4 mg/dL (ref 8.4–10.5)
Chloride: 105 meq/L (ref 96–112)
Creatinine, Ser: 1 mg/dL (ref 0.40–1.50)
GFR: 71.51 mL/min (ref >60.00)
Glucose, Bld: 109 mg/dL — ABNORMAL HIGH (ref 70–99)
Potassium: 4.4 meq/L (ref 3.5–5.1)
Sodium: 141 meq/L (ref 135–145)
Total Bilirubin: 0.8 mg/dL (ref 0.2–1.2)
Total Protein: 6.6 g/dL (ref 6.0–8.3)

## 2023-08-17 LAB — HM DIABETES EYE EXAM

## 2023-08-17 LAB — HEMOGLOBIN A1C: Hgb A1c MFr Bld: 6.3 % (ref 4.6–6.5)

## 2023-08-17 NOTE — Progress Notes (Signed)
Subjective:    Patient ID: Thomas Bradley, male    DOB: [birth year], 77 y.o.   MRN: [id number]  DOS:  08/17/2023 Type of visit - description: f/u  Since the last office visit is doing well. Chronic medical problems addressed. Atrial fibrillation, anticoagulated, denies chest pain or difficulty breathing. No blood in the stools or in the urine. Still has nocturia but no other LUTS.    Review of Systems See above   Past Medical History:  Diagnosis Date   Arthritis    Atrial fibrillation (HCC) 12/30/2021   7-day Zio patch monitor: 100% A-fib   Benign prostatic hypertrophy    ED, sees urology every year   BPH (benign prostatic hypertrophy) with urinary obstruction    Dr. Isabel Caprice   Elevated PSA    Dr. Isabel Caprice   Erectile dysfunction due to arterial insufficiency    Dr. Isabel Caprice   Erythema multiforme    dx 2013    GERD (gastroesophageal reflux disease)    Heart murmur    Hyperlipemia    Hypertension    Nocturia    Prostatitis    2010, again 09-2011    Past Surgical History:  Procedure Laterality Date   7-day Zio patch  12/2021   100% A-fib.  Heart rate range 55 to 179 bpm.  (Slow rate, controlled VR and RVR) 3 short runs of NSVT (fastest and longest: 9 beats at a rate to 40 bpm).  Occasional PVCs   CARDIOVERSION N/A 02/17/2022   Procedure: CARDIOVERSION;  Surgeon: Meriam Sprague, MD;  Location: [facility] ENDOSCOPY;  Service: Cardiovascular;  Laterality: N/A;   HERNIA REPAIR  07/27/2010   open, R inguinal w/ mesh   JOINT REPLACEMENT     NM MYOVIEW LTD  01/30/2022   Normal EF 55 to 60%.  LOW RISK.  No ischemia or infarction.   TOTAL HIP ARTHROPLASTY Left 10/10/2019   Procedure: LEFT TOTAL HIP ARTHROPLASTY ANTERIOR APPROACH;  Surgeon: Gean Birchwood, MD;  Location: [facility] ORS;  Service: Orthopedics;  Laterality: Left;   TRANSTHORACIC ECHOCARDIOGRAM  01/01/2022   Normal LV size and function.  EF 55 to 60%.  No RWMA.  With A-fib, unable to assess DF.  Normal atrial sizes.  Normal RV  size and function.  Normal PAP and borderline elevated RAP.  (Estimated 8 mmHg)    Current Outpatient Medications  Medication Instructions   amLODipine (NORVASC) 5 MG tablet TAKE 1 TABLET(5 MG) BY MOUTH DAILY   apixaban (ELIQUIS) 5 mg, Oral, 2 times daily   Cholecalciferol (VITAMIN D3) 2000 UNITS TABS 1 tablet, Oral, Daily, Reported on 02/01/2016   doxazosin (CARDURA) 4 mg, Oral, Daily   metoprolol succinate (TOPROL-XL) 25 MG 24 hr tablet TAKE 1 TABLET(25 MG) BY MOUTH DAILY   Multiple Vitamin (MULTIVITAMIN) capsule 1 capsule, Oral, Daily, Reported on 02/01/2016   pravastatin (PRAVACHOL) 80 mg, Oral, Daily at bedtime   vitamin E 400 mg, Oral, Daily       Objective:   Physical Exam BP 120/76   Pulse 68   Temp 98.1 F (36.7 C) (Oral)   Resp 16   Ht 6\' 2"  (1.88 m)   Wt 200 lb 4 oz (90.8 kg)   SpO2 97%   BMI 25.71 kg/m  General:   Well developed, NAD, BMI noted. HEENT:  Normocephalic . Face symmetric, atraumatic Lungs:  CTA B Normal respiratory effort, no intercostal retractions, no accessory muscle use. Heart: Seems regular,  no murmur.  Lower extremities: no pretibial edema bilaterally  Skin: Not pale. Not jaundice Neurologic:  alert & oriented X3.  Speech normal, gait appropriate for age and unassisted Psych--  Cognition and judgment appear intact.  Cooperative with normal attention span and concentration.  Behavior appropriate. No anxious or depressed appearing.      Assessment    Assessment DM HTN (intol to losartan, 2018) Hyperlipidemia A-fib: New onset 12-2021.  Stress test April 2023: NEG.  Anticoagulated GERD GU: --BPH w/ elevated PSA, asymmetric prostate --Prostatitis 2010, 2012 --ED Has a epi-pen -- reason? Pt not sure, had an allergy to a medicine, name? H/o Erythema multiforme 2013  PLAN:   DM: Diet controlled, check A1c, has a very healthy lifestyle HTN: Ambulatory BPs range from 119-137 with wnl diastolic BPs.  Heart rate 50s, 60s.  Continue  amlodipine, Cardura, metoprolol.  Check a CMP. High cholesterol: On pravastatin, check FLP. A-fib: Tolerating anticoagulation well. BPH: Continued nocturia, to see urology soon. Preventive  care: Had a flu shot, reluctant to get a COVID-vaccine, pro>cons,  asked to reconsider RTC CPX 4 months

## 2023-08-17 NOTE — Assessment & Plan Note (Signed)
DM: Diet controlled, check A1c, has a very healthy lifestyle HTN: Ambulatory BPs range from 119-137 with wnl diastolic BPs.  Heart rate 50s, 60s.  Continue amlodipine, Cardura, metoprolol.  Check a CMP. High cholesterol: On pravastatin, check FLP. A-fib: Tolerating anticoagulation well. BPH: Continued nocturia, to see urology soon. Preventive  care: Had a flu shot, reluctant to get a COVID-vaccine, pro>cons,  asked to reconsider RTC CPX 4 months

## 2023-08-17 NOTE — Patient Instructions (Signed)
Please consider take a COVID-vaccine.  Continue checking your blood pressure regularly Blood pressure goal:  between 110/65 and  135/85. If it is consistently higher or lower, let me know     GO TO THE LAB : Get the blood work     Next visit with me in 4 months, physical exam. Please schedule it at the front desk

## 2023-09-28 DIAGNOSIS — Z125 Encounter for screening for malignant neoplasm of prostate: Secondary | ICD-10-CM | POA: Diagnosis not present

## 2023-10-01 ENCOUNTER — Other Ambulatory Visit: Payer: Self-pay | Admitting: Internal Medicine

## 2023-10-02 ENCOUNTER — Other Ambulatory Visit: Payer: Self-pay | Admitting: Cardiology

## 2023-10-02 ENCOUNTER — Other Ambulatory Visit: Payer: Self-pay | Admitting: Internal Medicine

## 2023-10-05 DIAGNOSIS — R351 Nocturia: Secondary | ICD-10-CM | POA: Diagnosis not present

## 2023-10-05 DIAGNOSIS — Z125 Encounter for screening for malignant neoplasm of prostate: Secondary | ICD-10-CM | POA: Diagnosis not present

## 2023-10-05 DIAGNOSIS — N401 Enlarged prostate with lower urinary tract symptoms: Secondary | ICD-10-CM | POA: Diagnosis not present

## 2023-11-27 ENCOUNTER — Ambulatory Visit: Payer: Medicare (Managed Care) | Attending: Cardiology | Admitting: Cardiology

## 2023-11-27 VITALS — BP 100/50 | HR 79 | Ht 74.0 in | Wt 206.0 lb

## 2023-11-27 DIAGNOSIS — E1169 Type 2 diabetes mellitus with other specified complication: Secondary | ICD-10-CM | POA: Diagnosis not present

## 2023-11-27 DIAGNOSIS — E785 Hyperlipidemia, unspecified: Secondary | ICD-10-CM

## 2023-11-27 DIAGNOSIS — I480 Paroxysmal atrial fibrillation: Secondary | ICD-10-CM | POA: Diagnosis not present

## 2023-11-27 DIAGNOSIS — I10 Essential (primary) hypertension: Secondary | ICD-10-CM

## 2023-11-27 DIAGNOSIS — D6869 Other thrombophilia: Secondary | ICD-10-CM | POA: Diagnosis not present

## 2023-11-27 NOTE — Progress Notes (Unsigned)
Cardiology Office Note:  .   Date:  11/30/2023  ID:  Thomas Bradley, DOB 12/18/1945, MRN [id number] PCP: Wanda Plump, MD  [facility] Providers Cardiologist:  Bryan Lemma, MD     Chief Complaint  Patient presents with   Follow-up    Annual follow-up.  Doing well   Atrial Fibrillation    Relatively asymptomatic.  No issues with DOAC.    Patient Profile: .     Thomas Bradley is a  78 y.o. male  with a PMH notable for Paroxysmal Atrial Fibrillation, HTN, HLD, DM-2 and Family History of CAD who presents here for delayed annual follow-up at the request of Wanda Plump, MD.   Thomas Bradley was last seen in December 2023: He was doing well at that time.  Stable from a car standpoint.  His older brother just passed away at age 79 a month before and his sister died 1 month prior to that at age 108.  Thankfully, he was doing well with no issues or symptoms from his A-fib.  No fatigue or dyspnea.  No chest pain or pressure.  He did note a little orthostatic dizziness and we reduce his amlodipine to 5 mg daily.  Subjective  Discussed the use of AI scribe software for clinical note transcription with the patient, who gave verbal consent to proceed.  History of Present Illness   He is a 78 year old male with atrial fibrillation who presents for annual follow-up visit.  He has a history of atrial fibrillation and is currently asymptomatic. No lightheadedness, heart palpitations, or irregular heartbeats. He is unaware of being in atrial fibrillation, as he does not feel any symptoms associated with it. He is currently taking Eliquis twice daily, metoprolol, amlodipine, doxazosin, and pravastatin. His blood pressure has been running low, with a recent reading of 141/63. No dizziness or lightheadedness despite the low blood pressure readings.  He mentions feeling tired a couple of days ago but attributes it to the cold weather and staying indoors. No shortness of breath, chest pain, or swelling in  the legs. He also denies experiencing any symptoms of heart failure, such as shortness of breath while walking, lying down, or waking up at night.  He is compliant with his medication regimen, taking Eliquis, metoprolol, amlodipine, doxazosin, and pravastatin as prescribed. He takes his blood pressure medications in the morning and reports no issues with adherence.  No significant health issues over the past year, except for a cold a few weeks ago. He denies any operations or major health events.        Objective    Studies Reviewed: Marland Kitchen   EKG Interpretation Date/Time:  Friday November 27 2023 08:19:52 EST Ventricular Rate:  79 PR Interval:    QRS Duration:  90 QT Interval:  372 QTC Calculation: 426 R Axis:   61  Text Interpretation: Atrial fibrillation Nonspecific ST and T wave abnormality When compared with ECG of 17-Feb-2022 09:58, Atrial fibrillation has replaced Sinus rhythm Confirmed by Bryan Lemma ([id number]) on 11/27/2023 8:35:32 AM    No new studies  Lab Results  Component Value Date   CHOL 149 08/17/2023   HDL 46.30 08/17/2023   LDLCALC 85 08/17/2023   LDLDIRECT 175.1 05/03/2008   TRIG 90.0 08/17/2023   CHOLHDL 3 08/17/2023    Lab Results  Component Value Date   HGBA1C 6.3 08/17/2023   Lab Results  Component Value Date   TSH 1.70 12/30/2021   Lab Results  Component  Value Date   NA 141 08/17/2023   K 4.4 08/17/2023   CREATININE 1.00 08/17/2023   GFR 71.51 08/17/2023   GLUCOSE 109 (H) 08/17/2023    Risk Assessment/Calculations:    CHA2DS2-VASc Score = 4   This indicates a 4.8% annual risk of stroke. The patient's score is based upon: CHF History: 0 HTN History: 1 Diabetes History: 0 Stroke History: 0 Vascular Disease History: 1 Age Score: 2 Gender Score: 0   On Eliquis      Physical Exam:   VS:  BP (!) 100/50 (BP Location: Right Arm, Patient Position: Sitting, Cuff Size: Normal)   Pulse 79   Ht 6\' 2"  (1.88 m)   Wt 206 lb (93.4 kg)   SpO2 96%    BMI 26.45 kg/m    Wt Readings from Last 3 Encounters:  11/27/23 206 lb (93.4 kg)  08/17/23 200 lb 4 oz (90.8 kg)  03/03/23 202 lb 6 oz (91.8 kg)    GEN: Healthy room.  Well nourished, well groomed in no acute distress; looks younger than stated age NECK: No JVD; No carotid bruits CARDIAC: Regular rate with irregularly irregular rhythm.  Distant heart sounds but mostly normal S1, S2; no murmurs, rubs, gallops RESPIRATORY:  Clear to auscultation without rales, wheezing or rhonchi ; nonlabored, good air movement. ABDOMEN: Soft, non-tender, non-distended EXTREMITIES:  No edema; No deformity     ASSESSMENT AND PLAN: .    Problem List Items Addressed This Visit       Cardiology Problems   Essential hypertension - Primary (Chronic)   Blood pressure readings variable, with some readings on the lower side.  Patient currently on combination of amlodipine 5 mg daily, metoprolol lection 8 (Toprol-XL) 25 mg daily, (and Doxazosin 4 mg daily). -Split Amlodipine and Metoprolol dosing to AM and PM to avoid potential hypotensive episodes. -Take Amlodipine with morning dose of Eliquis and Metoprolol with evening dose of Eliquis.      Relevant Orders   EKG 12-Lead (Completed)   Hypercoagulable state due to paroxysmal atrial fibrillation (HCC); CHA2DS2-VASc score 4 (HTN, DM, age x 2) (Chronic)   CHA2DS2-VASc score of 4 Remains on Eliquis 5 mg twice daily for stroke prophylaxis. Again financial issues discussed.  Will try to of help assist with samples and patient assistance.  Okay to hold Eliquis for procedures or surgeries 24 hours for low risk procedures 48 hours for standard moderate risk procedures 72 hours for high risk procedure such as neurologic/spinal procedures.       Paroxysmal atrial fibrillation (HCC) (Chronic)   Asymptomatic, currently in AFib on today's EKG.  No symptoms of heart failure or angina.  Heart rate controlled with Toprol 25 mg daily. Patient is on Eliquis 5 mg  twice daily for stroke prevention. -Continue current management with rate control and anticoagulation. -Consider rhythm control if patient becomes symptomatic in the future.      Relevant Orders   EKG 12-Lead (Completed)     Other   Dyslipidemia associated with type 2 diabetes mellitus (HCC) (Chronic)   Most recent LDL was within target range with an LDL of 85 on 80 mg pravastatin.  In the absence of truly diagnosed ischemic CAD, relatively well within goal. -Continue pravastatin 80 mg daily  Based on last A1c, he is no longer in the diabetes zone with an A1c of 6.3.   Discussed healthy diet.  Not currently on medications.  Continue to monitor.  Defer to PCP.       Follow-Up:  Return in about 6 months (around 05/26/2024).o reassess symptoms and blood pressure control.      Signed, Marykay Lex, MD, MS Bryan Lemma, M.D., M.S. Interventional Cardiologist  [facility]  Pager # [phone] Phone # [phone] [address] [place], [place] [address]

## 2023-11-27 NOTE — Patient Instructions (Addendum)
Medication Instructions:    Change- by taking Metoprolol succinate to the evening dose with your Eliquis  *If you need a refill on your cardiac medications before your next appointment, please call your pharmacy*   Lab Work: Not needed    Testing/Procedures: Not needed   Follow-Up: At [facility], you and your health needs are our priority.  As part of our continuing mission to provide you with exceptional heart care, we have created designated Provider Care Teams.  These Care Teams include your primary Cardiologist (physician) and Advanced Practice Providers (APPs -  Physician Assistants and Nurse Practitioners) who all work together to provide you with the care you need, when you need it.     Your next appointment:   6 month(s)  The format for your next appointment:   In Person  Provider:   Bryan Lemma, MD

## 2023-11-30 ENCOUNTER — Encounter: Payer: Self-pay | Admitting: Cardiology

## 2023-11-30 NOTE — Assessment & Plan Note (Signed)
CHA2DS2-VASc score of 4 Remains on Eliquis 5 mg twice daily for stroke prophylaxis. Again financial issues discussed.  Will try to of help assist with samples and patient assistance.  Okay to hold Eliquis for procedures or surgeries 24 hours for low risk procedures 48 hours for standard moderate risk procedures 72 hours for high risk procedure such as neurologic/spinal procedures.

## 2023-11-30 NOTE — Assessment & Plan Note (Signed)
Blood pressure readings variable, with some readings on the lower side.  Patient currently on combination of amlodipine 5 mg daily, metoprolol lection 8 (Toprol-XL) 25 mg daily, (and Doxazosin 4 mg daily). -Split Amlodipine and Metoprolol dosing to AM and PM to avoid potential hypotensive episodes. -Take Amlodipine with morning dose of Eliquis and Metoprolol with evening dose of Eliquis.

## 2023-11-30 NOTE — Assessment & Plan Note (Signed)
Most recent LDL was within target range with an LDL of 85 on 80 mg pravastatin.  In the absence of truly diagnosed ischemic CAD, relatively well within goal. -Continue pravastatin 80 mg daily  Based on last A1c, he is no longer in the diabetes zone with an A1c of 6.3.   Discussed healthy diet.  Not currently on medications.  Continue to monitor.  Defer to PCP.

## 2023-11-30 NOTE — Assessment & Plan Note (Addendum)
Asymptomatic, currently in AFib on today's EKG.  No symptoms of heart failure or angina.  Heart rate controlled with Toprol 25 mg daily. Patient is on Eliquis 5 mg twice daily for stroke prevention. -Continue current management with rate control and anticoagulation. -Consider rhythm control if patient becomes symptomatic in the future.

## 2023-12-18 ENCOUNTER — Ambulatory Visit: Payer: Medicare (Managed Care) | Admitting: Internal Medicine

## 2023-12-18 ENCOUNTER — Encounter: Payer: Self-pay | Admitting: Internal Medicine

## 2023-12-18 VITALS — BP 116/60 | HR 68 | Temp 98.1°F | Resp 18 | Ht 74.0 in | Wt 211.1 lb

## 2023-12-18 DIAGNOSIS — E1169 Type 2 diabetes mellitus with other specified complication: Secondary | ICD-10-CM

## 2023-12-18 DIAGNOSIS — E785 Hyperlipidemia, unspecified: Secondary | ICD-10-CM

## 2023-12-18 DIAGNOSIS — I10 Essential (primary) hypertension: Secondary | ICD-10-CM | POA: Diagnosis not present

## 2023-12-18 DIAGNOSIS — I480 Paroxysmal atrial fibrillation: Secondary | ICD-10-CM

## 2023-12-18 DIAGNOSIS — E119 Type 2 diabetes mellitus without complications: Secondary | ICD-10-CM

## 2023-12-18 LAB — BASIC METABOLIC PANEL
BUN: 17 mg/dL (ref 6–23)
CO2: 29 meq/L (ref 19–32)
Calcium: 9.1 mg/dL (ref 8.4–10.5)
Chloride: 104 meq/L (ref 96–112)
Creatinine, Ser: 1.04 mg/dL (ref 0.40–1.50)
GFR: 68.06 mL/min (ref >60.00)
Glucose, Bld: 114 mg/dL — ABNORMAL HIGH (ref 70–99)
Potassium: 4.2 meq/L (ref 3.5–5.1)
Sodium: 141 meq/L (ref 135–145)

## 2023-12-18 LAB — CBC WITH DIFFERENTIAL/PLATELET
Basophils Absolute: 0 10*3/uL (ref 0.0–0.1)
Basophils Relative: 0.4 % (ref 0.0–3.0)
Eosinophils Absolute: 0.1 10*3/uL (ref 0.0–0.7)
Eosinophils Relative: 2.1 % (ref 0.0–5.0)
HCT: 43.2 % (ref 39.0–52.0)
Hemoglobin: 14.3 g/dL (ref 13.0–17.0)
Lymphocytes Relative: 21.8 % (ref 12.0–46.0)
Lymphs Abs: 1.1 10*3/uL (ref 0.7–4.0)
MCHC: 33.2 g/dL (ref 30.0–36.0)
MCV: 94.9 fL (ref 78.0–100.0)
Monocytes Absolute: 0.4 10*3/uL (ref 0.1–1.0)
Monocytes Relative: 8.3 % (ref 3.0–12.0)
Neutro Abs: 3.5 10*3/uL (ref 1.4–7.7)
Neutrophils Relative %: 67.4 % (ref 43.0–77.0)
Platelets: 137 10*3/uL — ABNORMAL LOW (ref 150.0–400.0)
RBC: 4.55 Mil/uL (ref 4.22–5.81)
RDW: 15 % (ref 11.5–15.5)
WBC: 5.1 10*3/uL (ref 4.0–10.5)

## 2023-12-18 LAB — MICROALBUMIN / CREATININE URINE RATIO
Creatinine,U: 115 mg/dL
Microalb Creat Ratio: 7.7 mg/g (ref 0.0–30.0)
Microalb, Ur: 0.9 mg/dL (ref 0.0–1.9)

## 2023-12-18 LAB — HEMOGLOBIN A1C: Hgb A1c MFr Bld: 6.6 % — ABNORMAL HIGH (ref 4.6–6.5)

## 2023-12-18 NOTE — Progress Notes (Signed)
 Subjective:    Patient ID: Thomas Bradley, male    DOB: [birth year], 78 y.o.   MRN: [id number]  DOS:  12/18/2023 Type of visit - description: Follow-up  Since the last office visit is feeling well. Saw cardiology, note reviewed. Ambulatory BP readings reviewed with the patient.  Denies chest pain or difficulty breathing.  No lower extremity edema, no palpitations. No blood in the stools or in the urine  Review of Systems See above   Past Medical History:  Diagnosis Date   Arthritis    Atrial fibrillation (HCC) 12/30/2021   7-day Zio patch monitor: 100% A-fib   Benign prostatic hypertrophy    ED, sees urology every year   BPH (benign prostatic hypertrophy) with urinary obstruction    Dr. Isabel Caprice   Elevated PSA    Dr. Isabel Caprice   Erectile dysfunction due to arterial insufficiency    Dr. Isabel Caprice   Erythema multiforme    dx 2013    GERD (gastroesophageal reflux disease)    Heart murmur    Hyperlipemia    Hypertension    Nocturia    Prostatitis    2010, again 09-2011    Past Surgical History:  Procedure Laterality Date   7-day Zio patch  12/2021   100% A-fib.  Heart rate range 55 to 179 bpm.  (Slow rate, controlled VR and RVR) 3 short runs of NSVT (fastest and longest: 9 beats at a rate to 40 bpm).  Occasional PVCs   CARDIOVERSION N/A 02/17/2022   Procedure: CARDIOVERSION;  Surgeon: Meriam Sprague, MD;  Location: [facility] ENDOSCOPY;  Service: Cardiovascular;  Laterality: N/A;   HERNIA REPAIR  07/27/2010   open, R inguinal w/ mesh   JOINT REPLACEMENT     NM MYOVIEW LTD  01/30/2022   Normal EF 55 to 60%.  LOW RISK.  No ischemia or infarction.   TOTAL HIP ARTHROPLASTY Left 10/10/2019   Procedure: LEFT TOTAL HIP ARTHROPLASTY ANTERIOR APPROACH;  Surgeon: Gean Birchwood, MD;  Location: [facility] ORS;  Service: Orthopedics;  Laterality: Left;   TRANSTHORACIC ECHOCARDIOGRAM  01/01/2022   Normal LV size and function.  EF 55 to 60%.  No RWMA.  With A-fib, unable to assess DF.  Normal atrial  sizes.  Normal RV size and function.  Normal PAP and borderline elevated RAP.  (Estimated 8 mmHg)    Current Outpatient Medications  Medication Instructions   amLODipine (NORVASC) 5 MG tablet TAKE 1 TABLET(5 MG) BY MOUTH DAILY   apixaban (ELIQUIS) 5 mg, Oral, 2 times daily   Cholecalciferol (VITAMIN D3) 50 MCG (2000 UT) capsule 1 capsule Orally Once a day for 30 day(s)   doxazosin (CARDURA) 4 mg, Oral, Daily   metoprolol succinate (TOPROL-XL) 25 MG 24 hr tablet TAKE 1 TABLET(25 MG) BY MOUTH DAILY   Multiple Vitamin (MULTIVITAMIN) capsule 1 capsule, Daily   pravastatin (PRAVACHOL) 80 mg, Oral, Daily at bedtime   vitamin E 400 mg, Daily       Objective:   Physical Exam BP 116/60   Pulse 68   Temp 98.1 F (36.7 C) (Oral)   Resp 18   Ht 6\' 2"  (1.88 m)   Wt 211 lb 2 oz (95.8 kg)   SpO2 97%   BMI 27.11 kg/m  General:   Well developed, NAD, BMI noted. HEENT:  Normocephalic . Face symmetric, atraumatic Lungs:  CTA B Normal respiratory effort, no intercostal retractions, no accessory muscle use. Heart: Irregularly irregular Lower extremities: no pretibial edema bilaterally  Skin: Not  pale. Not jaundice Neurologic:  alert & oriented X3.  Speech normal, gait appropriate for age and unassisted Psych--  Cognition and judgment appear intact.  Cooperative with normal attention span and concentration.  Behavior appropriate. No anxious or depressed appearing.      Assessment     Assessment DM HTN (intol to losartan, 2018) Hyperlipidemia A-fib: New onset 12-2021.  Stress test April 2023: NEG.  Anticoagulated GERD GU: --BPH w/ elevated PSA, asymmetric prostate --Prostatitis 2010, 2012 --ED Has a epi-pen -- reason? Pt not sure, had an allergy to a medicine, name? H/o Erythema multiforme 2013  PLAN:   DM: Diet controlled, check A1c and micro.  Eye exam at the [facility] per patient. HTN: Saw cardiology 11/27/2023, they noted BP readings variable and recommended to change the timing  of medications.  Ambulatory BPs: 134/75, 126/70, 116/62, 121/76.  Denies dizziness or lightheadedness. Continue with amlodipine, Cardura, metoprolol. A-fib:  Cost of Eliquis is an issue, addressed by cardiology.  Asymptomatic.   Anticoagulated: Check CBC HOH: Got hearing aids at the [place] High cholesterol: Last LDL 85, no change, see cardiology note. Preventive care: Recommend COVID-vaccine, strongly declines. RTC 4 months CPX

## 2023-12-18 NOTE — Patient Instructions (Addendum)
   Check the  blood pressure regularly Blood pressure goal:  between 110/65 and  135/85. If it is consistently higher or lower, let me know     GO TO THE LAB : Get the blood work     Next visit with me in 4 months, physical exam. Please schedule it at the front desk         Per our records you are due for your diabetic eye exam. Please contact your eye doctor to schedule an appointment. Please have them send copies of your office visit notes to [place]. Our fax number is [phone]. If you need a referral to an eye doctor please let us know.

## 2023-12-19 NOTE — Assessment & Plan Note (Signed)
 DM: Diet controlled, check A1c and micro.  Eye exam at the [facility] per patient. HTN: Saw cardiology 11/27/2023, they noted BP readings variable and recommended to change the timing of medications.  Ambulatory BPs: 134/75, 126/70, 116/62, 121/76.  Denies dizziness or lightheadedness. Continue with amlodipine, Cardura, metoprolol. A-fib:  Cost of Eliquis is an issue, addressed by cardiology.  Asymptomatic.   Anticoagulated: Check CBC HOH: Got hearing aids at the [place] High cholesterol: Last LDL 85, no change, see cardiology note. Preventive care: Recommend COVID-vaccine, strongly declines. RTC 4 months CPX

## 2023-12-20 ENCOUNTER — Encounter: Payer: Self-pay | Admitting: Internal Medicine

## 2023-12-27 ENCOUNTER — Other Ambulatory Visit: Payer: Self-pay | Admitting: Internal Medicine

## 2023-12-30 ENCOUNTER — Other Ambulatory Visit: Payer: Self-pay | Admitting: Cardiology

## 2024-01-22 ENCOUNTER — Telehealth: Payer: Self-pay | Admitting: Internal Medicine

## 2024-01-22 NOTE — Telephone Encounter (Signed)
 Copied from CRM [id number]. Topic: Medicare AWV >> Jan 22, 2024 11:37 AM Payton Doughty wrote: Reason for CRM: Called LVM 01/21/2024 to schedule AWV. Please schedule Virtual or Telehealth visits ONLY.   Verlee Rossetti; Care Guide Ambulatory Clinical Support [place] [facility] Direct Dial: [phone]

## 2024-03-24 ENCOUNTER — Ambulatory Visit
Admission: EM | Admit: 2024-03-24 | Discharge: 2024-03-24 | Disposition: A | Discharge status: Home / Self Care | Payer: Medicare (Managed Care) | Attending: Family Medicine | Admitting: Family Medicine

## 2024-03-24 ENCOUNTER — Encounter: Payer: Self-pay | Admitting: Emergency Medicine

## 2024-03-24 DIAGNOSIS — N3000 Acute cystitis without hematuria: Secondary | ICD-10-CM | POA: Diagnosis not present

## 2024-03-24 LAB — POCT URINALYSIS DIP (MANUAL ENTRY)
Bilirubin, UA: NEGATIVE
Glucose, UA: NEGATIVE mg/dL
Ketones, POC UA: NEGATIVE mg/dL
Nitrite, UA: NEGATIVE
Protein Ur, POC: 30 mg/dL — AB
Spec Grav, UA: 1.025 (ref 1.010–1.025)
Urobilinogen, UA: 0.2 U/dL
pH, UA: 5.5 (ref 5.0–8.0)

## 2024-03-24 MED ORDER — SULFAMETHOXAZOLE-TRIMETHOPRIM 800-160 MG PO TABS: 1.0000 | ORAL_TABLET | Freq: Two times a day (BID) | ORAL | 0 refills | Status: AC

## 2024-03-24 NOTE — ED Triage Notes (Signed)
 Pt c/o urinary burning, bladder pressure for 1 day

## 2024-03-24 NOTE — ED Provider Notes (Signed)
 [facility]    CSN: [id number] Arrival date & time: 03/24/24  1731      History   Chief Complaint Chief Complaint  Patient presents with   Dysuria    HPI KAMAU WEATHERALL is a 78 y.o. male.   Patient presents to clinic over concerns of dysuria and suprapubic pressure that started yesterday.  Has had UTIs in the past, most recent being about a year ago.  Did have a fever earlier today of 101, did not take any medication or antipyretic for this.  Afebrile in clinic.  Has not had any nausea or vomiting, denies flank pain.  Denies hematuria.  Has been more sedentary over the past few days with the rain and has not been drinking as much water as usual.  Does have a history of BPH.  The history is provided by the patient and medical records.  Dysuria   Past Medical History:  Diagnosis Date   Arthritis    Atrial fibrillation (HCC) 12/30/2021   7-day Zio patch monitor: 100% A-fib   Benign prostatic hypertrophy    ED, sees urology every year   BPH (benign prostatic hypertrophy) with urinary obstruction    Dr. Bosie Bye   Elevated PSA    Dr. Bosie Bye   Erectile dysfunction due to arterial insufficiency    Dr. Bosie Bye   Erythema multiforme    dx 2013    GERD (gastroesophageal reflux disease)    Heart murmur    Hyperlipemia    Hypertension    Nocturia    Prostatitis    2010, again 09-2011    Patient Active Problem List   Diagnosis Date Noted   Pyelonephritis 12/03/2022   Hypercoagulable state due to paroxysmal atrial fibrillation (HCC); CHA2DS2-VASc score 4 (HTN, DM, age x 2) 10/21/2022   Fatigue 01/27/2022   C. difficile diarrhea 01/21/2022   Paroxysmal atrial fibrillation (HCC) 12/30/2021   AVN of femur (HCC) 10/07/2019   PCP NOTES >>>>>>>>>>>>>>>>> 02/01/2016   Diabetes mellitus without complication (HCC) 05/01/2015   Rhinitis 08/30/2012   DJD (degenerative joint disease) 03/08/2012   Annual physical exam 01/28/2011   Inguinal hernia 01/23/2009   GERD  09/20/2008   Dyslipidemia associated with type 2 diabetes mellitus (HCC) 03/08/2007   Essential hypertension 03/08/2007   BPH (benign prostatic hyperplasia) 03/08/2007    Past Surgical History:  Procedure Laterality Date   7-day Zio patch  12/2021   100% A-fib.  Heart rate range 55 to 179 bpm.  (Slow rate, controlled VR and RVR) 3 short runs of NSVT (fastest and longest: 9 beats at a rate to 40 bpm).  Occasional PVCs   CARDIOVERSION N/A 02/17/2022   Procedure: CARDIOVERSION;  Surgeon: Sonny Dust, MD;  Location: [facility];  Service: Cardiovascular;  Laterality: N/A;   HERNIA REPAIR  07/27/2010   open, R inguinal w/ mesh   JOINT REPLACEMENT     NM MYOVIEW  LTD  01/30/2022   Normal EF 55 to 60%.  LOW RISK.  No ischemia or infarction.   TOTAL HIP ARTHROPLASTY Left 10/10/2019   Procedure: LEFT TOTAL HIP ARTHROPLASTY ANTERIOR APPROACH;  Surgeon: Wendolyn Hamburger, MD;  Location: [facility];  Service: Orthopedics;  Laterality: Left;   TRANSTHORACIC ECHOCARDIOGRAM  01/01/2022   Normal LV size and function.  EF 55 to 60%.  No RWMA.  With A-fib, unable to assess DF.  Normal atrial sizes.  Normal RV size and function.  Normal PAP and borderline elevated RAP.  (Estimated 8 mmHg)  Home Medications    Prior to Admission medications   Medication Sig Start Date End Date Taking? Authorizing Provider  sulfamethoxazole -trimethoprim  (BACTRIM  DS) 800-160 MG tablet Take 1 tablet by mouth 2 (two) times daily for 7 days. 03/24/24 03/31/24 Yes Keana Dueitt  N, FNP  amLODipine  (NORVASC ) 5 MG tablet TAKE 1 TABLET(5 MG) BY MOUTH DAILY 07/03/23   Arleen Lacer, MD  apixaban  (ELIQUIS ) 5 MG TABS tablet Take 1 tablet (5 mg total) by mouth 2 (two) times daily. 12/28/23   Paz, Jose E, MD  Cholecalciferol (VITAMIN D3) 50 MCG (2000 UT) capsule 1 capsule Orally Once a day for 30 day(s)    [provider]  doxazosin  (CARDURA ) 4 MG tablet Take 1 tablet (4 mg total) by mouth daily. 10/01/23   Paz, Jose E,  MD  metoprolol  succinate (TOPROL -XL) 25 MG 24 hr tablet TAKE 1 TABLET(25 MG) BY MOUTH DAILY 12/31/23   Arleen Lacer, MD  Multiple Vitamin (MULTIVITAMIN) capsule Take 1 capsule by mouth daily. Reported on 02/01/2016    [provider]  pravastatin  (PRAVACHOL ) 80 MG tablet Take 1 tablet (80 mg total) by mouth at bedtime. 10/02/23   Paz, Jose E, MD  vitamin E 400 UNIT capsule Take 400 mg by mouth daily.    [provider]    Family History Family History  Problem Relation Age of Onset   Early death Mother    Coronary artery disease Father 24   Early death Father    Hypertension Sister    Diabetes Brother        Poorly controlled   Coronary artery disease Brother        History of CABG   Peripheral Artery Disease Brother    Alcoholism Brother    Congestive Heart Failure Brother    Early death Sister    Stroke Neg Hx    Colon cancer Neg Hx    Prostate cancer Neg Hx     Social History Social History   Tobacco Use   Smoking status: Former    Current packs/day: 0.00    Types: Cigarettes    Quit date: 11/13/1976    Years since quitting: 47.3   Smokeless tobacco: Never  Vaping Use   Vaping status: Never Used  Substance Use Topics   Alcohol use: No   Drug use: No     Allergies   Oxycodone  hcl   Review of Systems Review of Systems  Per HPI  Physical Exam Triage Vital Signs ED Triage Vitals  Encounter Vitals Group     BP 03/24/24 1738 136/76     Systolic BP Percentile --      Diastolic BP Percentile --      Pulse Rate 03/24/24 1738 80     Resp 03/24/24 1738 17     Temp 03/24/24 1738 98.2 F (36.8 C)     Temp Source 03/24/24 1738 Oral     SpO2 03/24/24 1738 96 %     Weight --      Height --      Head Circumference --      Peak Flow --      Pain Score 03/24/24 1741 0     Pain Loc --      Pain Education --      Exclude from Growth Chart --    No data found.  Updated Vital Signs BP 136/76 (BP Location: Right Arm)   Pulse 80   Temp 98.2  F (36.8 C) (Oral)  Resp 17   SpO2 96%   Visual Acuity Right Eye Distance:   Left Eye Distance:   Bilateral Distance:    Right Eye Near:   Left Eye Near:    Bilateral Near:     Physical Exam Vitals and nursing note reviewed.  Constitutional:      Appearance: Normal appearance.  HENT:     Head: Normocephalic and atraumatic.     Right Ear: External ear normal.     Left Ear: External ear normal.     Nose: Nose normal.     Mouth/Throat:     Mouth: Mucous membranes are moist.  Eyes:     Conjunctiva/sclera: Conjunctivae normal.  Cardiovascular:     Rate and Rhythm: Normal rate.     Heart sounds: Normal heart sounds. No murmur heard. Pulmonary:     Effort: Pulmonary effort is normal. No respiratory distress.  Abdominal:     Tenderness: There is no right CVA tenderness or left CVA tenderness.  Neurological:     General: No focal deficit present.     Mental Status: He is alert.  Psychiatric:        Mood and Affect: Mood normal.      UC Treatments / Results  Labs (all labs ordered are listed, but only abnormal results are displayed) Labs Reviewed  POCT URINALYSIS DIP (MANUAL ENTRY) - Abnormal; Notable for the following components:      Result Value   Blood, UA trace-intact (*)    Protein Ur, POC =30 (*)    Leukocytes, UA Small (1+) (*)    All other components within normal limits  URINE CULTURE    EKG   Radiology No results found.  Procedures Procedures (including critical care time)  Medications Ordered in UC Medications - No data to display  Initial Impression / Assessment and Plan / UC Course  I have reviewed the triage vital signs and the nursing notes.  Pertinent labs & imaging results that were available during my care of the patient were reviewed by me and considered in my medical decision making (see chart for details).  Vitals in triage reviewed, patient is hemodynamically stable.  Afebrile, without tachycardia, nausea, vomiting or flank pain.   Negative for CVA tenderness, low concern for pyelonephritis at this time.  Urinalysis shows trace red blood cells and small leukocytes, will send for culture.  Concerning for acute cystitis, will treat with Bactrim as this is useful for males with possible prostatitis as well.  Symptomatic management for UTI discussed.  Plan of care, follow-up care return precautions given, no questions at this time.     Final Clinical Impressions(s) / UC Diagnoses   Final diagnoses:  Acute cystitis without hematuria     Discharge Instructions      Take the Bactrim twice daily for the next 7 days to treat your urinary tract infection.  Ensure you are drinking at least 64 ounces of water daily to help flush the kidneys.  You can use over-the-counter AZO or Pyridium to help with the burning sensation when you urinate, this will change the color of your urine to a highlighter yellow-orange color.  Symptoms should improve over the next few days, if no improvement or any changes return to clinic for reevaluation.  ED Prescriptions     Medication Sig Dispense Auth. Provider   sulfamethoxazole -trimethoprim (BACTRIM DS) 800-160 MG tablet Take 1 tablet by mouth 2 (two) times daily for 7 days. 14 tablet Harlow Lighter, Ardythe Klute  Neale Bale, FNP  PDMP not reviewed this encounter.   Harlow Lighter, [facility]  N, FNP 03/24/24 [phone]

## 2024-03-24 NOTE — Discharge Instructions (Addendum)
 Take the Bactrim twice daily for the next 7 days to treat your urinary tract infection.  Ensure you are drinking at least 64 ounces of water daily to help flush the kidneys.  You can use over-the-counter AZO or Pyridium to help with the burning sensation when you urinate, this will change the color of your urine to a highlighter yellow-orange color.  Symptoms should improve over the next few days, if no improvement or any changes return to clinic for reevaluation.

## 2024-03-27 LAB — URINE CULTURE: Culture: >=100000 — AB

## 2024-03-28 ENCOUNTER — Ambulatory Visit: Payer: Self-pay | Admitting: Emergency Medicine

## 2024-03-29 ENCOUNTER — Other Ambulatory Visit: Payer: Self-pay | Admitting: Internal Medicine

## 2024-03-30 ENCOUNTER — Encounter: Payer: Self-pay | Admitting: Cardiology

## 2024-04-18 ENCOUNTER — Encounter: Payer: Self-pay | Admitting: Internal Medicine

## 2024-04-18 ENCOUNTER — Ambulatory Visit (INDEPENDENT_AMBULATORY_CARE_PROVIDER_SITE_OTHER): Payer: Medicare (Managed Care) | Admitting: Internal Medicine

## 2024-04-18 VITALS — BP 126/78 | HR 82 | Temp 98.1°F | Resp 16 | Ht 74.0 in | Wt 204.2 lb

## 2024-04-18 DIAGNOSIS — Z0000 Encounter for general adult medical examination without abnormal findings: Secondary | ICD-10-CM

## 2024-04-18 DIAGNOSIS — Z0001 Encounter for general adult medical examination with abnormal findings: Secondary | ICD-10-CM

## 2024-04-18 DIAGNOSIS — E785 Hyperlipidemia, unspecified: Secondary | ICD-10-CM | POA: Diagnosis not present

## 2024-04-18 DIAGNOSIS — I10 Essential (primary) hypertension: Secondary | ICD-10-CM | POA: Diagnosis not present

## 2024-04-18 DIAGNOSIS — E119 Type 2 diabetes mellitus without complications: Secondary | ICD-10-CM

## 2024-04-18 DIAGNOSIS — I480 Paroxysmal atrial fibrillation: Secondary | ICD-10-CM

## 2024-04-18 DIAGNOSIS — E1169 Type 2 diabetes mellitus with other specified complication: Secondary | ICD-10-CM | POA: Diagnosis not present

## 2024-04-18 LAB — LIPID PANEL
Cholesterol: 136 mg/dL (ref 0–200)
HDL: 45.8 mg/dL (ref >39.00)
LDL Cholesterol: 76 mg/dL (ref 0–99)
NonHDL: 89.81
Total CHOL/HDL Ratio: 3
Triglycerides: 67 mg/dL (ref 0.0–149.0)
VLDL: 13.4 mg/dL (ref 0.0–40.0)

## 2024-04-18 LAB — COMPREHENSIVE METABOLIC PANEL WITH GFR
ALT: 12 U/L (ref 0–53)
AST: 16 U/L (ref 0–37)
Albumin: 4.2 g/dL (ref 3.5–5.2)
Alkaline Phosphatase: 73 U/L (ref 39–117)
BUN: 22 mg/dL (ref 6–23)
CO2: 28 meq/L (ref 19–32)
Calcium: 9.2 mg/dL (ref 8.4–10.5)
Chloride: 105 meq/L (ref 96–112)
Creatinine, Ser: 1.11 mg/dL (ref 0.40–1.50)
GFR: 62.8 mL/min (ref >60.00)
Glucose, Bld: 101 mg/dL — ABNORMAL HIGH (ref 70–99)
Potassium: 4 meq/L (ref 3.5–5.1)
Sodium: 141 meq/L (ref 135–145)
Total Bilirubin: 0.8 mg/dL (ref 0.2–1.2)
Total Protein: 7 g/dL (ref 6.0–8.3)

## 2024-04-18 LAB — HEMOGLOBIN A1C: Hgb A1c MFr Bld: 6.6 % — ABNORMAL HIGH (ref 4.6–6.5)

## 2024-04-18 NOTE — Assessment & Plan Note (Signed)
 Here for CPX  Other issues addressed today DM: Diet controlled, no ambulatory CBGs, checking A1c, feet exam negative HTN on amlodipine , Cardura , metoprolol .  Ambulatory BPs range from 108 to 140 , the majority of readings are within range.  No change, check a CMP. Hyperlipidemia: On pravastatin , check FLP A-fib, anticoagulated, rate controlled, asymptomatic. UTI Urgent care visit 03/24/2024, dysuria, suprapubic pressure, urine culture: Staphylococcus epidermidis.  Treated with Bactrim .  Now is completely asymptomatic. RTC 4 to 5 months

## 2024-04-18 NOTE — Assessment & Plan Note (Signed)
 Here for CPX -Td 2016 -  prevnar: 2015; pneumonia shot 2010 and 2022; PNM 20: due 2027 - zostavax 07-22-10; S/P Shingrix, s/p RSV - Vaccines are recommended: Tdap, COVID booster, flu shot every fall -RRD:rnonwndrneb 2004,Dr Santogade, colonoscopy again 05-2010 -Dr Dianna-, hyperplastic polyps, cscope 06-2015: no polyps. Cscope 11-2020, 1 polyp, tubular adenoma,next (if needed)  per GI   -Prostate cancer screening: see urology q year; PSA 2.3 (09/28/2023, K PN) - H-POA: Information provided

## 2024-04-18 NOTE — Patient Instructions (Addendum)
 Vaccines I recommend, at the pharmacy: - Tetanus shot (Tdap) - COVID booster - Flu shot every fall   Continue checking your blood pressure regularly Blood pressure goal:  between 110/65 and  135/85. If it is consistently higher or lower, let me know     GO TO THE LAB :  Get the blood work   Your results will be posted on MyChart with my comments  Next office visit for a In 4 to 5 months Please make an appointment before you leave today       Health Care Power of attorney (Also know as a  Living will or  Advance care planning documents)  If you already have a living will or healthcare power of attorney, is recommended you bring the copy to be scanned in your chart.   The document will be available to all the doctors you see in the system.  If you are over 68 y/o and don't have the document, please read:  Advance care planning is a process that supports adults in  understanding and sharing their preferences regarding future medical care.  The patient's preferences are recorded in documents called Advance Directives and the can be modified at any time while the patient is in full mental capacity.     More information at: [email or web address]

## 2024-04-18 NOTE — Progress Notes (Signed)
 Subjective:    Patient ID: Thomas Bradley, male    DOB: 05/08/1946, 78 y.o.   MRN: [id number]  DOS:  04/18/2024 Type of visit - description: CPX  Here for CPX  Chronic medical problems addressed. Recently was diagnosed with a UTI, currently asymptomatic. Ambulatory BPs WNL. Does not check ambulatory CBGs.  He specifically denies any chest pain no difficulty breathing. No palpitations.  Review of Systems  Other than above, a 14 point review of systems is negative    Past Medical History:  Diagnosis Date   Arthritis    Atrial fibrillation (HCC) 12/30/2021   7-day Zio patch monitor: 100% A-fib   Benign prostatic hypertrophy    ED, sees urology every year   BPH (benign prostatic hypertrophy) with urinary obstruction    Dr. Alline   Elevated PSA    Dr. Alline   Erectile dysfunction due to arterial insufficiency    Dr. Alline   Erythema multiforme    dx 2013    GERD (gastroesophageal reflux disease)    Heart murmur    Hyperlipemia    Hypertension    Nocturia    Prostatitis    2010, again 09-2011    Past Surgical History:  Procedure Laterality Date   7-day Zio patch  12/2021   100% A-fib.  Heart rate range 55 to 179 bpm.  (Slow rate, controlled VR and RVR) 3 short runs of NSVT (fastest and longest: 9 beats at a rate to 40 bpm).  Occasional PVCs   CARDIOVERSION N/A 02/17/2022   Procedure: CARDIOVERSION;  Surgeon: Hobart Powell BRAVO, MD;  Location: [facility];  Service: Cardiovascular;  Laterality: N/A;   HERNIA REPAIR  07/27/2010   open, R inguinal w/ mesh   JOINT REPLACEMENT     NM MYOVIEW  LTD  01/30/2022   Normal EF 55 to 60%.  LOW RISK.  No ischemia or infarction.   TOTAL HIP ARTHROPLASTY Left 10/10/2019   Procedure: LEFT TOTAL HIP ARTHROPLASTY ANTERIOR APPROACH;  Surgeon: Liam Lerner, MD;  Location: [facility];  Service: Orthopedics;  Laterality: Left;   TRANSTHORACIC ECHOCARDIOGRAM  01/01/2022   Normal LV size and function.  EF 55 to 60%.  No RWMA.  With  A-fib, unable to assess DF.  Normal atrial sizes.  Normal RV size and function.  Normal PAP and borderline elevated RAP.  (Estimated 8 mmHg)   Social History   Social History Narrative   Has 3 acres, works in the yard a lot x 6 hours sometimes, feels well.     4 G-kids              Current Outpatient Medications  Medication Instructions   amLODipine  (NORVASC ) 5 MG tablet TAKE 1 TABLET(5 MG) BY MOUTH DAILY   apixaban  (ELIQUIS ) 5 mg, Oral, 2 times daily   Cholecalciferol (VITAMIN D3) 50 MCG (2000 UT) capsule 1 capsule Orally Once a day for 30 day(s)   doxazosin  (CARDURA ) 4 mg, Oral, Daily   metoprolol  succinate (TOPROL -XL) 25 MG 24 hr tablet TAKE 1 TABLET(25 MG) BY MOUTH DAILY   Multiple Vitamin (MULTIVITAMIN) capsule 1 capsule, Daily   pravastatin  (PRAVACHOL ) 80 mg, Oral, Daily at bedtime   vitamin E 400 mg, Daily       Objective:   Physical Exam BP 126/78   Pulse 82   Temp 98.1 F (36.7 C) (Oral)   Resp 16   Ht 6' 2 (1.88 m)   Wt 204 lb 4 oz (92.6 kg)   SpO2 96%  BMI 26.22 kg/m  General: Well developed, NAD, BMI noted Neck: No  thyromegaly  HEENT:  Normocephalic . Face symmetric, atraumatic Lungs:  CTA B Normal respiratory effort, no intercostal retractions, no accessory muscle use. Heart: Regularly irregular Abdomen:  Not distended, soft, non-tender. No rebound or rigidity.   DM foot exam: No edema, good pedal pulses, pinprick examination normal Skin: Exposed areas without rash. Not pale. Not jaundice Neurologic:  alert & oriented X3.  Speech normal, gait appropriate for age and unassisted Strength symmetric and appropriate for age.  Psych: Cognition and judgment appear intact.  Cooperative with normal attention span and concentration.  Behavior appropriate. No anxious or depressed appearing.     Assessment   Assessment DM HTN (intol to losartan , 2018) Hyperlipidemia A-fib: New onset 12-2021.  Stress test April 2023: NEG.   Anticoagulated GERD GU: --BPH w/ elevated PSA, asymmetric prostate --Prostatitis 2010, 2012 --ED HOH Has a epi-pen -- reason? Pt not sure, had an allergy to a medicine, name? H/o Erythema multiforme 2013  PLAN: Here for CPX -Td 2016 -  prevnar: 2015; pneumonia shot 2010 and 2022; PNM 20: due 2027 - zostavax 07-22-10; S/P Shingrix, s/p RSV - Vaccines are recommended: Tdap, COVID booster, flu shot every fall -RRD:rnonwndrneb 2004,Dr Santogade, colonoscopy again 05-2010 -Dr Dianna-, hyperplastic polyps, cscope 06-2015: no polyps. Cscope 11-2020, 1 polyp, tubular adenoma,next (if needed)  per GI   -Prostate cancer screening: see urology q year; PSA 2.3 (09/28/2023, K PN) - H-POA: Information provided  Other issues addressed today DM: Diet controlled, no ambulatory CBGs, checking A1c, feet exam negative HTN on amlodipine , Cardura , metoprolol .  Ambulatory BPs range from 108 to 140 , the majority of readings are within range.  No change, check a CMP. Hyperlipidemia: On pravastatin , check FLP A-fib, anticoagulated, rate controlled, asymptomatic. UTI Urgent care visit 03/24/2024, dysuria, suprapubic pressure, urine culture: Staphylococcus epidermidis.  Treated with Bactrim .  Now is completely asymptomatic. RTC 4 to 5 months

## 2024-04-19 ENCOUNTER — Ambulatory Visit: Payer: Self-pay | Admitting: Internal Medicine

## 2024-05-25 ENCOUNTER — Other Ambulatory Visit: Payer: Self-pay | Admitting: Internal Medicine

## 2024-05-30 ENCOUNTER — Ambulatory Visit: Payer: Medicare (Managed Care) | Attending: Cardiology | Admitting: Cardiology

## 2024-05-30 ENCOUNTER — Encounter: Payer: Self-pay | Admitting: Cardiology

## 2024-05-30 VITALS — BP 100/76 | HR 68 | Ht 73.0 in | Wt 203.0 lb

## 2024-05-30 DIAGNOSIS — E785 Hyperlipidemia, unspecified: Secondary | ICD-10-CM | POA: Diagnosis not present

## 2024-05-30 DIAGNOSIS — I480 Paroxysmal atrial fibrillation: Secondary | ICD-10-CM | POA: Diagnosis not present

## 2024-05-30 DIAGNOSIS — D6869 Other thrombophilia: Secondary | ICD-10-CM

## 2024-05-30 DIAGNOSIS — E1169 Type 2 diabetes mellitus with other specified complication: Secondary | ICD-10-CM | POA: Diagnosis not present

## 2024-05-30 DIAGNOSIS — I4821 Permanent atrial fibrillation: Secondary | ICD-10-CM

## 2024-05-30 DIAGNOSIS — I10 Essential (primary) hypertension: Secondary | ICD-10-CM

## 2024-05-30 NOTE — Progress Notes (Signed)
 Cardiology Office Note:  .   Date:  06/07/2024  ID:  Thomas Bradley, DOB 1945/12/02, MRN [id number] PCP: Amon Aloysius BRAVO, MD  [facility] Providers Cardiologist:  Alm Clay, MD     Chief Complaint  Patient presents with   Follow-up   Atrial Fibrillation    Completely asymptomatic.    Patient Profile: .     Thomas Bradley is a  78 y.o. male  with a PMH notable for essentially Permanent Atrial Fibrillation, HTN, HLD, DM-2 and Family History of CAD  who presents here for 6 month f/u at the request of Amon Aloysius BRAVO, MD.     Thomas Bradley was last seen on 11/26/2023 for routine follow-up.  He was still asymptomatic with his A-fib.  He denied any lightheadedness, heart palpitations, or irregular heartbeats. He was essentially unaware of being in atrial fibrillation, as he was not able to Vascepa for panic attack I think this thing off to build do the I think this whole thing off to do it so this thing comes to feel any symptoms associated with it. He is currently taking Eliquis  twice daily, metoprolol , amlodipine , doxazosin , and pravastatin . His blood pressure had been running low, but denied any dizziness or lightheadedness despite the low blood pressure readings.  Noted feeling a little fatigued but usually related to staying indoors during cold weather and not getting his exercise.  No chest pain or pressure.  No significant health issues over the past year, except for a cold a few weeks ago. He denies any operations or major health events.  => No changes toher than change BB to PM  Subjective   Thomas Bradley returns today for 8-month follow-up still doing well.  Essentially cardiac unaware of being in A-fib or not.  He denies any irregular heartbeats or palpitations.  No syncope or near-syncope.  No chest pain or pressure with rest or exertion.  He does have some mild positional dizziness when his blood pressure is low but that is not routine.  He has been started on Cardura  for difficulty  with urination and thinks that may have something.  No melena, hematochezia, hematuria, epistaxis or significant nuisance bleeding on Eliquis .  No angina or heart failure symptoms.-No PND, orthopnea or edema.  No TIA/amaurosis fugax or CVA symptoms.  No claudication.     Objective   Medications: Amlodipine  5 mg daily; Toprol -XL 25 mg daily.  Eliquis  5 mg twice daily.  Doxazosin /Cardura  4 mg daily, pravastatin  80 mg daily.  Studies Reviewed: SABRA   EKG Interpretation Date/Time:  Monday May 30 2024 09:03:21 EDT Ventricular Rate:  68 PR Interval:    QRS Duration:  88 QT Interval:  380 QTC Calculation: 404 R Axis:   -5  Text Interpretation: Atrial fibrillation When compared with ECG of 27-Nov-2023 08:19, Questionable change in QRS axis T wave inversion now evident in Inferior leads Confirmed by Clay Alm ([id number]) on 05/30/2024 9:15:09 AM    Lab Results  Component Value Date   NA 141 04/18/2024   K 4.0 04/18/2024   CREATININE 1.11 04/18/2024   GFR 62.80 04/18/2024   GLUCOSE 101 (H) 04/18/2024   Lab Results  Component Value Date   HGBA1C 6.6 (H) 04/18/2024   Lab Results  Component Value Date   CHOL 136 04/18/2024   HDL 45.80 04/18/2024   LDLCALC 76 04/18/2024   LDLDIRECT 175.1 05/03/2008   TRIG 67.0 04/18/2024   CHOLHDL 3 04/18/2024    Lexiscan  Myoview  (01/30/2022):  LOW RISK.  No ischemia or infarction.  EF 55 to 65%. Echocardiogram (01/01/2022): Normal LVEF from 55 to 6%.  No RWMA.  Indeterminate diastolic parameters.  Normal RV and RVP with mildly elevated RAP.  Mild to moderate MR.  Normal AoV. 7-day Zio patch (March 2023): A-fib with slow rate as well as controlled rate and RVR.  3 runs of NSVT otherwise rare PVCs and no prolonged pauses.  Risk Assessment/Calculations:    CHA2DS2-VASc Score = 4   This indicates a 4.8% annual risk of stroke. The patient's score is based upon: CHF History: 0 HTN History: 1 Diabetes History: 0 Stroke History: 0 Vascular Disease  History: 1 Age Score: 2 Gender Score: 0          Physical Exam:   VS:  BP 100/76   Pulse 68   Ht 6' 1 (1.854 m)   Wt 203 lb (92.1 kg)   SpO2 99%   BMI 26.78 kg/m    Wt Readings from Last 3 Encounters:  05/30/24 203 lb (92.1 kg)  04/18/24 204 lb 4 oz (92.6 kg)  12/18/23 211 lb 2 oz (95.8 kg)    GEN: Healthy appearing.  Well nourished, well groomed in no acute distress; looks much younger than stated age. NECK: No JVD; No carotid bruits CARDIAC: Normal S1, S2; RRR, no murmurs, rubs, gallops RESPIRATORY:  Clear to auscultation without rales, wheezing or rhonchi ; nonlabored, good air movement. ABDOMEN: Soft, non-tender, non-distended EXTREMITIES:  No edema; No deformity     ASSESSMENT AND PLAN: .    Problem List Items Addressed This Visit       Cardiology Problems   Essential hypertension (Chronic)   Actually borderline hypotensive today. He is taking amlodipine  helpful at different times a day (amlodipine  a.m., Toprol  p.m.).  He is additionally on Cardura /doxazosin  which can also lower blood pressure - Encourage adequate hydration and being careful standing up quickly.      Relevant Orders   EKG 12-Lead (Completed)   Hypercoagulable state due to paroxysmal atrial fibrillation (HCC); CHA2DS2-VASc score 4 (HTN, DM, age x 2) (Chronic)   He is on Eliquis  5 mg twice daily for CHA2DS2-VASc score of 4 and PAF. No bleeding issues-monitoring that he is getting Acharya, epistaxis or nuisance bleeding. With no stroke history and essentially persistent/permanent A-fib, there is low risk of conversion in and out of A-fib making stroke less likely.  -Okay to hold Eliquis  2 to 3 days preop risk procedures per protocol.      Permanent atrial fibrillation (HCC) - Primary (Chronic)   He is essentially asymptomatic in A-fib with pretty well-controlled rate on low-dose Toprol  25 mg daily. On Eliquis  5 mg twice daily for CVA prophylaxis with no bleeding issues.  - Continue Eliquis   and Toprol  at current doses. -Okay to hold Eliquis  2 to 3 days preop for surgeries or procedures.      Relevant Orders   EKG 12-Lead (Completed)     Other   Dyslipidemia associated with type 2 diabetes mellitus (HCC) (Chronic)   Most recent liver panel showed an LDL of 76 with overall within goal for risk factors.  A1c 6.6-borderline but not on medication.  Working on dietary adjustment.  - Continue pravastatin  80 mg daily. -Defer to PCP to of the timing of when to initiate therapy for DM with an A1c of 6.6.              Follow-Up: Return in about 1 year (around 05/30/2025) for Routine follow  up with me, [employer].     Signed, Alm MICAEL Clay, MD, MS Alm Clay, M.D., M.S. Interventional Chartered certified accountant  Pager # [phone]

## 2024-05-30 NOTE — Patient Instructions (Signed)
 Medication Instructions:   NO CHANGES *If you need a refill on your cardiac medications before your next appointment, please call your pharmacy*   Lab Work: NOT NEEDED    Testing/Procedures:  NOT NEEDED  Follow-Up: At [facility], you and your health needs are our priority.  As part of our continuing mission to provide you with exceptional heart care, we have created designated Provider Care Teams.  These Care Teams include your primary Cardiologist (physician) and Advanced Practice Providers (APPs -  Physician Assistants and Nurse Practitioners) who all work together to provide you with the care you need, when you need it.     Your next appointment:   12 month(s)  The format for your next appointment:   In Person  Provider:   Alm Clay, MD   Other Instructions    DURING THE SUMMER -- DRINKING 2 - 3 EXTRA  GLASS WATER

## 2024-06-06 ENCOUNTER — Encounter: Payer: Self-pay | Admitting: Cardiology

## 2024-06-06 NOTE — Assessment & Plan Note (Signed)
 Actually borderline hypotensive today. He is taking amlodipine  helpful at different times a day (amlodipine  a.m., Toprol  p.m.).  He is additionally on Cardura /doxazosin  which can also lower blood pressure - Encourage adequate hydration and being careful standing up quickly.

## 2024-06-06 NOTE — Assessment & Plan Note (Signed)
 He is on Eliquis  5 mg twice daily for CHA2DS2-VASc score of 4 and PAF. No bleeding issues-monitoring that he is getting Acharya, epistaxis or nuisance bleeding. With no stroke history and essentially persistent/permanent A-fib, there is low risk of conversion in and out of A-fib making stroke less likely.  -Okay to hold Eliquis  2 to 3 days preop risk procedures per protocol.

## 2024-06-06 NOTE — Assessment & Plan Note (Signed)
 He is essentially asymptomatic in A-fib with pretty well-controlled rate on low-dose Toprol  25 mg daily. On Eliquis  5 mg twice daily for CVA prophylaxis with no bleeding issues.  - Continue Eliquis  and Toprol  at current doses. -Okay to hold Eliquis  2 to 3 days preop for surgeries or procedures.

## 2024-06-07 NOTE — Assessment & Plan Note (Signed)
 Most recent liver panel showed an LDL of 76 with overall within goal for risk factors.  A1c 6.6-borderline but not on medication.  Working on dietary adjustment.  - Continue pravastatin  80 mg daily. -Defer to PCP to of the timing of when to initiate therapy for DM with an A1c of 6.6.

## 2024-06-09 LAB — LIPID PANEL
Cholesterol: 149 (ref 0–200)
HDL: 47 (ref 35–70)
LDL Cholesterol: 94
Triglycerides: 77 (ref 40–160)

## 2024-06-09 LAB — COMPREHENSIVE METABOLIC PANEL WITH GFR
Albumin: 4.7 (ref 3.5–5.0)
Calcium: 9.6 (ref 8.7–10.7)
eGFR: 67

## 2024-06-09 LAB — CBC AND DIFFERENTIAL
HCT: 44 (ref 41–53)
Hemoglobin: 15.1 (ref 13.5–17.5)
Platelets: 138 K/uL — AB (ref 150–400)
WBC: 4.8

## 2024-06-09 LAB — HEPATIC FUNCTION PANEL
ALT: 12 U/L (ref 10–40)
AST: 20 (ref 14–40)
Alkaline Phosphatase: 88 (ref 25–125)
Bilirubin, Direct: 0.3 (ref 0.01–0.4)
Bilirubin, Total: 0.9

## 2024-06-09 LAB — BASIC METABOLIC PANEL WITH GFR
BUN: 20 (ref 4–21)
CO2: 28 — AB (ref 13–22)
Chloride: 110 — AB (ref 99–108)
Creatinine: 1.1 (ref 0.6–1.3)
Glucose: 88
Potassium: 4.9 meq/L (ref 3.5–5.1)
Sodium: 144 (ref 137–147)

## 2024-06-09 LAB — TSH: TSH: 1.72 (ref 0.41–5.90)

## 2024-06-09 LAB — VITAMIN D 25 HYDROXY (VIT D DEFICIENCY, FRACTURES): Vit D, 25-Hydroxy: 110

## 2024-06-09 LAB — CBC: RBC: 4.75 (ref 3.87–5.11)

## 2024-06-09 LAB — HEMOGLOBIN A1C: Hemoglobin A1C: 6.4

## 2024-07-11 ENCOUNTER — Other Ambulatory Visit: Payer: Self-pay | Admitting: Internal Medicine

## 2024-07-17 ENCOUNTER — Other Ambulatory Visit: Payer: Self-pay | Admitting: Cardiology

## 2024-08-11 LAB — HM DIABETES EYE EXAM

## 2024-09-08 ENCOUNTER — Encounter: Payer: Self-pay | Admitting: Internal Medicine

## 2024-09-13 ENCOUNTER — Telehealth: Payer: Self-pay | Admitting: Internal Medicine

## 2024-09-13 DIAGNOSIS — N400 Enlarged prostate without lower urinary tract symptoms: Secondary | ICD-10-CM

## 2024-09-13 NOTE — Telephone Encounter (Signed)
 Referral placed.

## 2024-09-13 NOTE — Telephone Encounter (Signed)
 Copied from CRM [id number]. Topic: Referral - Request for Referral >> Sep 13, 2024  9:41 AM Roselie BROCKS wrote: Did the patient discuss referral with their provider in the last year? Yes (If No - schedule appointment) (If Yes - send message)  Appointment offered? Yes, appointment Dec 8th   Type of order/referral and detailed reason for visit: Urologist   Preference of office, provider, location: [facility], dr donnice Siad ,phone number [phone]  If referral order, have you been seen by this specialty before? Yes (If Yes, this issue or another issue? When? Where? Last 2 years   Can we respond through MyChart? Yes  Patient requests a return call when ready

## 2024-09-20 ENCOUNTER — Ambulatory Visit: Payer: Medicare (Managed Care) | Admitting: Internal Medicine

## 2024-09-20 ENCOUNTER — Ambulatory Visit: Payer: Medicare (Managed Care)

## 2024-10-03 DIAGNOSIS — R3912 Poor urinary stream: Secondary | ICD-10-CM | POA: Diagnosis not present

## 2024-10-03 DIAGNOSIS — N5201 Erectile dysfunction due to arterial insufficiency: Secondary | ICD-10-CM | POA: Diagnosis not present

## 2024-10-03 DIAGNOSIS — R351 Nocturia: Secondary | ICD-10-CM | POA: Diagnosis not present

## 2024-10-03 DIAGNOSIS — R350 Frequency of micturition: Secondary | ICD-10-CM | POA: Diagnosis not present

## 2024-10-03 DIAGNOSIS — N401 Enlarged prostate with lower urinary tract symptoms: Secondary | ICD-10-CM | POA: Diagnosis not present

## 2024-10-10 ENCOUNTER — Other Ambulatory Visit: Payer: Self-pay | Admitting: Internal Medicine

## 2024-10-15 ENCOUNTER — Other Ambulatory Visit: Payer: Self-pay | Admitting: Internal Medicine

## 2024-10-17 ENCOUNTER — Ambulatory Visit: Payer: Medicare (Managed Care)

## 2024-11-17 ENCOUNTER — Encounter: Payer: Self-pay | Admitting: Internal Medicine

## 2024-11-18 ENCOUNTER — Telehealth: Payer: Self-pay | Admitting: *Deleted

## 2024-11-18 ENCOUNTER — Ambulatory Visit: Payer: Medicare (Managed Care) | Admitting: *Deleted

## 2024-11-18 VITALS — BP 108/80 | HR 66 | Temp 97.7°F | Resp 16 | Ht 71.0 in | Wt 209.2 lb

## 2024-11-18 DIAGNOSIS — Z0000 Encounter for general adult medical examination without abnormal findings: Secondary | ICD-10-CM | POA: Diagnosis not present

## 2024-11-18 NOTE — Progress Notes (Signed)
 "  Chief Complaint  Patient presents with   Medicare Wellness     Subjective:   Thomas Bradley is a 79 y.o. male who presents for a Medicare Annual Wellness Visit.  Visit info / Clinical Intake: Medicare Wellness Visit Type:: Subsequent Annual Wellness Visit Persons participating in visit and providing information:: patient Medicare Wellness Visit Mode:: In-person (required for WTM) Interpreter Needed?: No Pre-visit prep was completed: yes AWV questionnaire completed by patient prior to visit?: yes Date:: 11/11/24 Living arrangements:: (Patient-Rptd) lives with spouse/significant other Patient's Overall Health Status Rating: (Patient-Rptd) good Typical amount of pain: (Patient-Rptd) some Does pain affect daily life?: (Patient-Rptd) no Are you currently prescribed opioids?: no  Dietary Habits and Nutritional Risks How many meals a day?: (Patient-Rptd) 3 Eats fruit and vegetables daily?: (Patient-Rptd) yes Most meals are obtained by: (Patient-Rptd) preparing own meals In the last 2 weeks, have you had any of the following?: none Diabetic:: (!) yes Any non-healing wounds?: no How often do you check your BS?: 0 Would you like to be referred to a Nutritionist or for Diabetic Management? : no  Functional Status Activities of Daily Living (to include ambulation/medication): (Patient-Rptd) Independent Ambulation: (Patient-Rptd) Independent Medication Administration: (Patient-Rptd) Independent Home Management (perform basic housework or laundry): (Patient-Rptd) Independent Manage your own finances?: (Patient-Rptd) yes Primary transportation is: (Patient-Rptd) driving Concerns about vision?: no *vision screening is required for WTM* ([facility] [place]) Concerns about hearing?: (!) yes Uses hearing aids?: (!) yes  Fall Screening Falls in the past year?: (Patient-Rptd) 0 Number of falls in past year: 0 Was there an injury with Fall?: 0 Fall Risk Category  Calculator: 0 Patient Fall Risk Level: Low Fall Risk  Fall Risk Patient at Risk for Falls Due to: No Fall Risks Fall risk Follow up: Falls evaluation completed  Home and Transportation Safety: All rugs have non-skid backing?: (Patient-Rptd) yes All stairs or steps have railings?: (Patient-Rptd) yes Grab bars in the bathtub or shower?: (Patient-Rptd) yes Have non-skid surface in bathtub or shower?: (Patient-Rptd) yes Good home lighting?: (Patient-Rptd) yes Regular seat belt use?: (Patient-Rptd) yes Hospital stays in the last year:: (Patient-Rptd) no  Cognitive Assessment Difficulty concentrating, remembering, or making decisions? : (Patient-Rptd) no Will 6CIT or Mini Cog be Completed: yes What year is it?: 0 points What month is it?: 0 points Give patient an address phrase to remember (5 components): [address] [place]  About what time is it?: 0 points Count backwards from 20 to 1: 0 points Say the months of the year in reverse: 2 points Repeat the address phrase from earlier: 4 points 6 CIT Score: 6 points  Advance Directives (For Healthcare) Does Patient Have a Medical Advance Directive?: No Would patient like information on creating a medical advance directive?: No - Patient declined  Reviewed/Updated  Reviewed/Updated: Reviewed All (Medical, Surgical, Family, Medications, Allergies, Care Teams, Patient Goals)    Allergies (verified) Oxycodone  hcl   Current Medications (verified) Outpatient Encounter Medications as of 11/18/2024  Medication Sig   amLODipine  (NORVASC ) 5 MG tablet TAKE 1 TABLET(5 MG) BY MOUTH DAILY   apixaban  (ELIQUIS ) 5 MG TABS tablet Take 1 tablet (5 mg total) by mouth 2 (two) times daily.   doxazosin  (CARDURA ) 4 MG tablet TAKE 1 TABLET(4 MG) BY MOUTH DAILY   metoprolol  succinate (TOPROL -XL) 25 MG 24 hr tablet TAKE 1 TABLET(25 MG) BY MOUTH DAILY   Multiple Vitamin (MULTIVITAMIN) capsule Take 1 capsule by mouth daily. Reported on  02/01/2016  pravastatin  (PRAVACHOL ) 80 MG tablet Take 1 tablet (80 mg total) by mouth at bedtime.   vitamin E 400 UNIT capsule Take 400 mg by mouth daily.   [DISCONTINUED] Cholecalciferol (VITAMIN D3) 50 MCG (2000 UT) capsule 1 capsule Orally Once a day for 30 day(s)   No facility-administered encounter medications on file as of 11/18/2024.    History: Past Medical History:  Diagnosis Date   Arthritis    Atrial fibrillation (HCC) 12/30/2021   7-day Zio patch monitor: 100% A-fib   Benign prostatic hypertrophy    ED, sees urology every year   BPH (benign prostatic hypertrophy) with urinary obstruction    Dr. Alline   Elevated PSA    Dr. Alline   Erectile dysfunction due to arterial insufficiency    Dr. Alline   Erythema multiforme    dx 2013    GERD (gastroesophageal reflux disease)    Heart murmur    Hyperlipemia    Hypertension    Nocturia    Prostatitis    2010, again 09-2011   Past Surgical History:  Procedure Laterality Date   7-day Zio patch  12/2021   100% A-fib.  Heart rate range 55 to 179 bpm.  (Slow rate, controlled VR and RVR) 3 short runs of NSVT (fastest and longest: 9 beats at a rate to 40 bpm).  Occasional PVCs   CARDIOVERSION N/A 02/17/2022   Procedure: CARDIOVERSION;  Surgeon: Hobart Powell BRAVO, MD;  Location: [facility] END[facility]COPY;  Service: Cardiovascular;  Laterality: N/A;   HERNIA REPAIR  07/27/2010   open, R inguinal w/ mesh   JOINT REPLACEMENT     NM MYOVIEW  LTD  01/30/2022   Normal EF 55 to 60%.  LOW RISK.  No ischemia or infarction.   TOTAL HIP ARTHROPLASTY Left 10/10/2019   Procedure: LEFT TOTAL HIP ARTHROPLASTY ANTERIOR APPROACH;  Surgeon: Liam Lerner, MD;  Location: [facility];  Service: Orthopedics;  Laterality: Left;   TRANSTHORACIC ECHOCARDIOGRAM  01/01/2022   Normal LV size and function.  EF 55 to 60%.  No RWMA.  With A-fib, unable to assess DF.  Normal atrial sizes.  Normal RV size and function.  Normal PAP and borderline elevated RAP.  (Estimated 8  mmHg)   Family History  Problem Relation Age of Onset   Early death Mother    Coronary artery disease Father 76   Early death Father    Hypertension Sister    Early death Sister    Diabetes Brother        Poorly controlled   Coronary artery disease Brother        History of CABG   Peripheral Artery Disease Brother    Alcoholism Brother    Congestive Heart Failure Brother    Stroke Neg Hx    Colon cancer Neg Hx    Prostate cancer Neg Hx    Social History   Occupational History   Occupation: retired   Tobacco Use   Smoking status: Former    Current packs/day: 0.00    Types: Cigarettes    Quit date: 11/13/1976    Years since quitting: 48.0   Smokeless tobacco: Never  Vaping Use   Vaping status: Never Used  Substance and Sexual Activity   Alcohol use: No   Drug use: No   Sexual activity: Never   Tobacco Counseling Counseling given: Not Answered  SDOH Screenings   Food Insecurity: No Food Insecurity (11/11/2024)  Housing: Low Risk (11/11/2024)  Transportation Needs: No Transportation Needs (11/11/2024)  Utilities:  Not At Risk (11/18/2024)  Alcohol Screen: Low Risk (03/05/2023)  Depression (PHQ2-9): Low Risk (11/18/2024)  Financial Resource Strain: Low Risk (11/11/2024)  Physical Activity: Insufficiently Active (11/11/2024)  Social Connections: Socially Integrated (11/11/2024)  Stress: No Stress Concern Present (11/11/2024)  Tobacco Use: Medium Risk (11/18/2024)   See flowsheets for full screening details  Depression Screen PHQ 2 & 9 Depression Scale- Over the past 2 weeks, how often have you been bothered by any of the following problems? Little interest or pleasure in doing things: 0 Feeling down, depressed, or hopeless (PHQ Adolescent also includes...irritable): 0 PHQ-2 Total Score: 0 Trouble falling or staying asleep, or sleeping too much: 0 Feeling tired or having little energy: 0 Poor appetite or overeating (PHQ Adolescent also includes...weight loss): 0 Feeling  bad about yourself - or that you are a failure or have let yourself or your family down: 0 Trouble concentrating on things, such as reading the newspaper or watching television (PHQ Adolescent also includes...like school work): 0 Moving or speaking so slowly that other people could have noticed. Or the opposite - being so fidgety or restless that you have been moving around a lot more than usual: 0 Thoughts that you would be better off dead, or of hurting yourself in some way: 0 PHQ-9 Total Score: 0     Goals Addressed             This Visit's Progress    Increase physical activity   Not on track            Objective:    Today's Vitals   11/18/24 0946  BP: 108/80  Pulse: 66  Resp: 16  Temp: 97.7 F (36.5 C)  TempSrc: Oral  SpO2: 97%  Weight: 209 lb 3.2 oz (94.9 kg)  Height: 5' 11 (1.803 m)   Body mass index is 29.18 kg/m.  Hearing/Vision screen No results found. Immunizations and Health Maintenance Health Maintenance  Topic Date Due   Diabetic kidney evaluation - Urine ACR  12/17/2024   HEMOGLOBIN A1C  12/10/2024   FOOT EXAM  04/18/2025   Diabetic kidney evaluation - eGFR measurement  06/09/2025   DTaP/Tdap/Td (3 - Td or Tdap) 07/24/2025   OPHTHALMOLOGY EXAM  08/11/2025   Medicare Annual Wellness (AWV)  11/18/2025   Colonoscopy  12/11/2025   Pneumococcal Vaccine: 50+ Years  Completed   Influenza Vaccine  Completed   Zoster Vaccines- Shingrix  Completed   Meningococcal B Vaccine  Aged Out   COVID-19 Vaccine  Discontinued   Hepatitis C Screening  Discontinued        Assessment/Plan:  This is a routine wellness examination for Thomas Bradley.  Patient Care Team: Amon Aloysius BRAVO, MD as PCP - General Anner Alm ORN, MD as PCP - Cardiology (Cardiology) Liam Lerner, MD as Consulting Physician (Orthopedic Surgery) Dianna Specking, MD as Consulting Physician (Gastroenterology) Elma Zachary RAMAN [facility], Bonni Lien  I have personally reviewed  and noted the following in the patients chart:   Medical and social history Use of alcohol, tobacco or illicit drugs  Current medications and supplements including opioid prescriptions. Functional ability and status Nutritional status Physical activity Advanced directives List of other physicians Hospitalizations, surgeries, and ER visits in previous 12 months Vitals Screenings to include cognitive, depression, and falls Referrals and appointments  No orders of the defined types were placed in this encounter.  In addition, I have reviewed and discussed with patient certain preventive protocols, quality metrics, and best practice recommendations. A written personalized  care plan for preventive services as well as general preventive health recommendations were provided to patient.   Lolita Libra, CMA   11/18/2024   Return in 1 year (on 11/18/2025).  After Visit Summary: (In Person-Printed) AVS printed and given to the patient  Nurse Notes: HM Addressed: Due for urine MALB, pt requests to wait until follow up with PCP. See phone note   "

## 2024-11-18 NOTE — Telephone Encounter (Signed)
 If he has no concerns okay to see him 01/02/2025

## 2024-11-18 NOTE — Telephone Encounter (Signed)
 Pt had AWV today and scored 6 on the 6-cit screen. He stated he did not have any concerns with his memory, Just usual forgetful occurrences.

## 2024-11-18 NOTE — Patient Instructions (Addendum)
 [name],  Thank you for taking the time for your Medicare Wellness Visit. I appreciate your continued commitment to your health goals. Please review the care plan we discussed, and feel free to reach out if I can assist you further.  Please note that Annual Wellness Visits do not include a physical exam. Some assessments may be limited, especially if the visit was conducted virtually. If needed, we may recommend an in-person follow-up with your provider.  Ongoing Care Seeing your primary care provider every 3 to 6 months helps us  monitor your health and provide consistent, personalized care.   Dr Amon: 01/02/25 10:40am Medicare AWV:  11/20/25 11am, in person  Referrals If a referral was made during today's visit and you haven't received any updates within two weeks, please contact the referred provider directly to check on the status.  Recommended Screenings:  Health Maintenance  Topic Date Due   Medicare Annual Wellness Visit  03/04/2024   Kidney health urinalysis for diabetes  12/17/2024   Hemoglobin A1C  12/10/2024   Complete foot exam   04/18/2025   Yearly kidney function blood test for diabetes  06/09/2025   DTaP/Tdap/Td vaccine (3 - Td or Tdap) 07/24/2025   Eye exam for diabetics  08/11/2025   Colon Cancer Screening  12/11/2025   Pneumococcal Vaccine for age over 73  Completed   Flu Shot  Completed   Zoster (Shingles) Vaccine  Completed   Meningitis B Vaccine  Aged Out   COVID-19 Vaccine  Discontinued   Hepatitis C Screening  Discontinued       11/11/2024   10:55 AM  Advanced Directives  Does Patient Have a Medical Advance Directive? No  Would patient like information on creating a medical advance directive? No - Patient declined  (Declined) Advance directive discussed with you today. Even though you declined this today, please call our office should you change your mind, and we can give you the proper paperwork for you to fill out.  You may also find Information on  Advanced Care Planning at  [place]  Secretary of [facility] Advance Health Care Directives Advance Health Care Directives ([email or web address]     Vision: Annual vision screenings are recommended for early detection of glaucoma, cataracts, and diabetic retinopathy. These exams can also reveal signs of chronic conditions such as diabetes and high blood pressure.  Dental: Annual dental screenings help detect early signs of oral cancer, gum disease, and other conditions linked to overall health, including heart disease and diabetes.  Please see the attached documents for additional preventive care recommendations.

## 2024-11-18 NOTE — Telephone Encounter (Signed)
 Next visit on 01/02/25- do you want to see him sooner?

## 2024-11-28 ENCOUNTER — Other Ambulatory Visit: Payer: Self-pay | Admitting: Internal Medicine

## 2025-01-02 ENCOUNTER — Ambulatory Visit: Admitting: Internal Medicine

## 2025-11-20 ENCOUNTER — Ambulatory Visit
